# Patient Record
Sex: Female | Born: 1960 | Race: Black or African American | Hispanic: No | Marital: Married | Smoking: Former smoker
Health system: Southern US, Community
[De-identification: ages and names within clinical notes are randomized; demographics above are authoritative.]

## PROBLEM LIST (undated history)

## (undated) DIAGNOSIS — E119 Type 2 diabetes mellitus without complications: Secondary | ICD-10-CM

## (undated) DIAGNOSIS — I10 Essential (primary) hypertension: Secondary | ICD-10-CM

## (undated) DIAGNOSIS — G4730 Sleep apnea, unspecified: Secondary | ICD-10-CM

## (undated) DIAGNOSIS — E785 Hyperlipidemia, unspecified: Secondary | ICD-10-CM

## (undated) DIAGNOSIS — R42 Dizziness and giddiness: Secondary | ICD-10-CM

## (undated) HISTORY — PX: TONSILLECTOMY: SUR1361

## (undated) HISTORY — DX: Hyperlipidemia, unspecified: E78.5

## (undated) HISTORY — PX: ABDOMINAL HYSTERECTOMY: SHX81

## (undated) HISTORY — DX: Type 2 diabetes mellitus without complications: E11.9

## (undated) HISTORY — DX: Dizziness and giddiness: R42

## (undated) HISTORY — DX: Essential (primary) hypertension: I10

## (undated) HISTORY — PX: CARPAL TUNNEL RELEASE: SHX101

---

## 2006-04-14 ENCOUNTER — Ambulatory Visit: Payer: Self-pay | Admitting: General Practice

## 2007-09-04 ENCOUNTER — Emergency Department: Payer: Self-pay | Admitting: Emergency Medicine

## 2011-07-27 ENCOUNTER — Ambulatory Visit: Payer: Self-pay

## 2014-01-17 ENCOUNTER — Ambulatory Visit: Payer: Self-pay | Admitting: Internal Medicine

## 2015-09-01 ENCOUNTER — Encounter (INDEPENDENT_AMBULATORY_CARE_PROVIDER_SITE_OTHER): Payer: Self-pay

## 2015-09-01 ENCOUNTER — Ambulatory Visit: Payer: Self-pay

## 2015-09-04 ENCOUNTER — Encounter: Payer: Self-pay | Admitting: Pharmacist

## 2015-12-04 ENCOUNTER — Encounter: Payer: Self-pay | Admitting: Pharmacist

## 2015-12-18 ENCOUNTER — Encounter: Payer: Self-pay | Admitting: Pharmacist

## 2016-06-24 ENCOUNTER — Encounter: Payer: Self-pay | Admitting: Pharmacist

## 2016-07-14 ENCOUNTER — Other Ambulatory Visit: Payer: Self-pay | Admitting: Internal Medicine

## 2016-07-14 DIAGNOSIS — Z1231 Encounter for screening mammogram for malignant neoplasm of breast: Secondary | ICD-10-CM

## 2016-08-05 ENCOUNTER — Encounter (INDEPENDENT_AMBULATORY_CARE_PROVIDER_SITE_OTHER): Payer: Self-pay

## 2016-08-05 ENCOUNTER — Ambulatory Visit: Payer: Self-pay | Admitting: Pharmacist

## 2016-08-05 ENCOUNTER — Encounter: Payer: Self-pay | Admitting: Pharmacist

## 2016-08-05 VITALS — BP 148/82 | Ht <= 58 in | Wt 209.0 lb

## 2016-08-05 DIAGNOSIS — Z79899 Other long term (current) drug therapy: Secondary | ICD-10-CM

## 2016-08-05 NOTE — Patient Instructions (Signed)
Brown rice, whole wheat/whole grain or spinach pasta as options. Spaghetti squash.  Look into walking around track in [place] (behind [facility]) 3 times per week 15-30 minutes per session. Start with 10 minutes per session.  Call [facility] to make sure you are in the [place]. [phone]. Mammogram.  Review Carb Counting Book for options.

## 2016-08-05 NOTE — Progress Notes (Signed)
[facility] Visit Note  Patient: Tamara Wells MRN: [id number] Date of Birth: 1961-05-14 PCP: Tamara Athens, MD   Tamara Wells 55 y.o. female presents for a 6 month follow-up MTM visit today.  BP (!) 148/82 (BP Location: Right Arm, Patient Position: Sitting, Cuff Size: Large)   Ht 4\' 10"  (1.473 m)   Wt 209 lb (94.8 kg)   BMI 43.68 kg/m   Patient Information   Past Medical History:  Diagnosis Date  . Diabetes mellitus without complication ([place])    Dx 2006  . Hyperlipidemia   . Hypertension   . Vertigo    2002      Past Surgical History:  Procedure Laterality Date  . ABDOMINAL HYSTERECTOMY     2000  . TONSILLECTOMY       Family History  Problem Relation Age of Onset  . Diabetes Mother   . Diabetes Father   . Diabetes Maternal Grandmother     New Diagnoses (since last visit): N/A  Family Support: Good  Lifestyle Diet: Breakfast: Oatmeal, eggs, toast Lunch: Sandwich Dinner:Vegetables, green beans, chicken pie; not a lot of fried foods Drinks: Diet Coke, sweet tea, water, coffee (cream, no sugar)    Current Exercise Habits: The patient does not participate in regular exercise at present     History  Alcohol Use No      History  Smoking Status  . Former Smoker  . Types: Cigarettes  . Quit date: 09/27/2004  Smokeless Tobacco  . Never Used      Health Maintenance  Topic Date Due  . Hepatitis C Screening  1961/03/19  . HIV Screening  08/05/1977  . TETANUS/TDAP  08/05/1981  . PAP SMEAR  08/06/1983  . MAMMOGRAM  08/05/2012  . COLONOSCOPY  08/05/2012  . INFLUENZA VACCINE  04/27/2016   Prior to Admission medications   Medication Sig Start Date End Date Taking? Authorizing Provider  atorvastatin (LIPITOR) 10 MG tablet Take 10 mg by mouth every evening.   Yes Tamara Athens, MD  Black Cohosh 40 MG CAPS Take 1 capsule by mouth 2 (two) times daily.   Yes Historical Provider, MD  glipiZIDE (GLUCOTROL) 10 MG tablet Take 10 mg by  mouth 2 (two) times daily before a meal.   Yes Tamara Athens, MD  insulin glargine (LANTUS) 100 UNIT/ML injection Inject 40 Units into the skin every morning.   Yes Tamara Athens, MD  lisinopril (PRINIVIL,ZESTRIL) 40 MG tablet Take 40 mg by mouth every morning.   Yes Tamara Athens, MD  meclizine (ANTIVERT) 25 MG tablet Take 25 mg by mouth daily. As needed for vertigo   Yes Tamara Athens, MD  metFORMIN (GLUCOPHAGE) 1000 MG tablet Take 1,000 mg by mouth 2 (two) times daily with a meal.   Yes Tamara Athens, MD  saxagliptin HCl (ONGLYZA) 5 MG TABS tablet Take 5 mg by mouth every morning.   Yes Tamara Athens, MD     Assessment and Plan: 1. Diabetes: Tamara Wells is not currently testing her blood glucose levels. States she has difficulty with her current meter. Currently taking Onglyza, glipizide, metformin and Lantus. Lantus dose was recently increased from 30 to 40 units daily. She is due for lab work and will get this done in the next week. No recent data on file. 2. Hyperlipidemia: No recent lab work available. Due for lab work in the next week. Currently taking atorvastatin in the evening. Patient does not eat much fried food anymore. 3. Hypertension: Blood pressure not at goal  of less than 140/90 mmHg. BP today was 148/82 mmHg. Lisinopril dose was recently increased from 20 mg daily to 20 mg twice daily. Patient picked up a new Rx today for 40 mg daily. 3. Hot Flashes: Currently using Black Cohosh for hot flashes. Due to family history of cancer, she is not using estrogen supplementation. Primary care doctor is aware she is using this supplement. 4. Vertigo: Has a prescription for meclizine to use as needed. No recent issues. 5. Lifestyle: Discussed alternated food options such as: spaghetti squash, whole wheat pasta, spinach pasta and brown rice. Gave patient a carb counting book to use for portion control and calorie awareness. Encouraged her to walk on track at the [facility]. At  home exercise options given for weight control: 10 minutes of extra activity at home prescribed. 6. Mammogram: Patient is scheduled for a routine mammogram in November. Gave her the Tamara Wells number to call to make sure she qualifies for assistance. 7. Compliance: Tamara Wells uses two weeks worth of pill boxes to organize her medications. She is compliant with her medications and understands what each medication is for. Return in 6 months for a follow-up MTM with the pharmacist.    Tamara Wells K. Dicky Doe, PharmD [facility] Operations Coordinator [phone]

## 2016-08-18 ENCOUNTER — Ambulatory Visit: Payer: Self-pay

## 2016-08-25 ENCOUNTER — Ambulatory Visit: Payer: Self-pay | Attending: Oncology | Admitting: *Deleted

## 2016-08-25 ENCOUNTER — Ambulatory Visit
Admission: RE | Admit: 2016-08-25 | Discharge: 2016-08-25 | Disposition: A | Discharge status: Home / Self Care | Payer: Self-pay | Source: Ambulatory Visit | Attending: Oncology | Admitting: Oncology

## 2016-08-25 ENCOUNTER — Encounter: Payer: Self-pay | Admitting: *Deleted

## 2016-08-25 VITALS — BP 147/82 | HR 106 | Temp 98.2°F | Ht 59.84 in | Wt 211.1 lb

## 2016-08-25 DIAGNOSIS — Z0000 Encounter for general adult medical examination without abnormal findings: Secondary | ICD-10-CM

## 2016-08-25 NOTE — Patient Instructions (Signed)
Gave patient hand-out, Women Staying Healthy, Active and Well from [facility], with education on breast health, pap smears, heart and colon health. 

## 2016-08-25 NOTE — Progress Notes (Signed)
Subjective:     Patient ID: Tamara Wells, female   DOB: [birth year], 55 y.o.   MRN: [id number]  HPI   Review of Systems     Objective:   Physical Exam  Pulmonary/Chest: Right breast exhibits no inverted nipple, no mass, no nipple discharge, no skin change and no tenderness. Left breast exhibits no inverted nipple, no mass, no nipple discharge, no skin change and no tenderness. Breasts are symmetrical.  Abdominal: There is no hepatomegaly.    Genitourinary: No labial fusion. There is no rash, tenderness, lesion or injury on the right labia. There is no rash, tenderness, lesion or injury on the left labia. Right adnexum displays no mass, no tenderness and no fullness. Left adnexum displays no mass and no fullness. No erythema, tenderness or bleeding in the vagina. No foreign body in the vagina. No signs of injury around the vagina. Vaginal discharge found.  Genitourinary Comments: Hysterectomy for fibroids - White non-odorous discharge noted.       Assessment:     55 year old Black female presents to [facility] for clinical breast exam, pelvic exam and mammogram.  Clinical breast exam unremarkable.  Taught self breast awareness.  Pelvic exam without masses or lesions.  There is no cervix present which is consistent with a hysterectomy.  Patient has been screened for eligibility.  She does not have any insurance, Medicare or Medicaid.  She also meets financial eligibility.  Hand-out given on the Affordable Care Act.    Plan:     Screening mammogram ordered.  Will follow-up per [facility] protocol.

## 2016-08-27 ENCOUNTER — Encounter: Payer: Self-pay | Admitting: *Deleted

## 2016-08-27 NOTE — Progress Notes (Signed)
Letter mailed from the [facility] to inform patient of her normal mammogram results.  Patient is to follow-up with annual screening in one year.  HSIS to [place]. 

## 2016-09-22 ENCOUNTER — Telehealth: Payer: Self-pay | Admitting: Pharmacist

## 2016-09-22 NOTE — Telephone Encounter (Signed)
Faxed Onglyza script to [facility] [employer] for refill.

## 2016-10-15 ENCOUNTER — Telehealth: Payer: Self-pay | Admitting: Pharmacist

## 2016-10-15 NOTE — Telephone Encounter (Signed)
Lantus Vials PAP submitted to manufacturer today. Lantus vials insulin, 30 units, once day/daily.

## 2016-11-29 ENCOUNTER — Telehealth: Payer: Self-pay | Admitting: Pharmacist

## 2016-11-29 NOTE — Telephone Encounter (Signed)
[place] for [place]nglyza 5, allow 7-10 days to receive.

## 2017-02-03 ENCOUNTER — Encounter: Payer: Self-pay | Admitting: Pharmacist

## 2017-02-10 ENCOUNTER — Ambulatory Visit: Payer: Self-pay | Admitting: Pharmacist

## 2017-02-10 ENCOUNTER — Encounter: Payer: Self-pay | Admitting: Pharmacist

## 2017-02-10 VITALS — BP 142/84 | Ht <= 58 in | Wt 204.0 lb

## 2017-02-10 DIAGNOSIS — Z79899 Other long term (current) drug therapy: Secondary | ICD-10-CM

## 2017-02-10 NOTE — Patient Instructions (Signed)
Asked Dr. Lavera Guise about using a SSRI such as Paxil (parotexine) for hot flashes. [facility] can provide this at no charge.

## 2017-02-10 NOTE — Progress Notes (Signed)
[facility]ication Management Clinic Visit Note  Patient: Tamara Wells MRN: [id number] Date of Birth: 08-Oct-1960 PCP: Cletis Athens, MD   [name] 56 y.o. female presents for a 6 month MTM follow-up visit today. Her only concern today was about her intense hot flashes.  BP (!) 142/84 (BP Location: Right Arm, Patient Position: Sitting, Cuff Size: Large)   Ht 4\' 9"  (1.448 m)   Wt 204 lb (92.5 kg)   BMI 44.15 kg/m   Patient Information   Past Medical History:  Diagnosis Date  . Diabetes mellitus without complication ([place])    Dx 2006  . Hyperlipidemia   . Hypertension   . Vertigo    2002      Past Surgical History:  Procedure Laterality Date  . ABDOMINAL HYSTERECTOMY     2000  . TONSILLECTOMY       Family History  Problem Relation Age of Onset  . Diabetes Mother   . Diabetes Father   . Diabetes Maternal Grandmother     New Diagnoses (since last visit):   Family Support: Good  Lifestyle Diet: Breakfast: oatmeal, cantaloupe, boiled eggs Lunch: ham and cheese, chips   Dinner: varies, squash, potatoes, hot dogs, baked chicken, broccoli, spinach, green beens Drinks: 3 cups of coffee with caffeine   Current Exercise Habits: The patient does not participate in regular exercise at present  Exercise limited by: None identified   History  Alcohol Use No      History  Smoking Status  . Former Smoker  . Types: Cigarettes  . Quit date: 09/27/2004  Smokeless Tobacco  . Never Used      Health Maintenance  Topic Date Due  . Hepatitis C Screening  November 23, 1960  . HIV Screening  08/05/1977  . TETANUS/TDAP  08/05/1981  . PAP SMEAR  08/06/1983  . COLONOSCOPY  08/05/2012  . INFLUENZA VACCINE  04/27/2017  . MAMMOGRAM  08/25/2018    Prior to Admission medications   Medication Sig Start Date End Date Taking? Authorizing Provider  acetaminophen (TYLENOL) 500 MG tablet Take 500 mg by mouth as needed.   Yes [provider]  atorvastatin (LIPITOR) 10 MG  tablet Take 10 mg by mouth every evening.   Yes Masoud, Viann Shove, MD  Black Cohosh 40 MG CAPS Take 1 capsule by mouth 2 (two) times daily.   Yes [provider]  glipiZIDE (GLUCOTROL) 10 MG tablet Take 10 mg by mouth 2 (two) times daily before a meal.   Yes Masoud, Viann Shove, MD  insulin glargine (LANTUS) 100 UNIT/ML injection Inject 40 Units into the skin every morning.   Yes Cletis Athens, MD  lisinopril (PRINIVIL,ZESTRIL) 40 MG tablet Take 40 mg by mouth every morning.   Yes Cletis Athens, MD  meclizine (ANTIVERT) 25 MG tablet Take 25 mg by mouth daily. As needed for vertigo   Yes Cletis Athens, MD  metFORMIN (GLUCOPHAGE) 1000 MG tablet Take 1,000 mg by mouth 2 (two) times daily with a meal.   Yes Masoud, Viann Shove, MD  Multiple Vitamins-Minerals (CENTRAL-VITE PO) Take 1 tablet by mouth daily.   Yes [provider]  saxagliptin HCl (ONGLYZA) 5 MG TABS tablet Take 5 mg by mouth every morning.   Yes Cletis Athens, MD   Health Maintenance/Date Completed  Last Visit to PCP: Around 09/2016  Next Visit to PCP: July 2018 Specialist Visit:  Dental Exam: denture Eye Exam: Due Prostate Exam: n/a Pelvic/PAP Exam: 2018 Mammogram: 2018 DEXA: none Colonoscopy: no Flu Vaccine: 2017 Pneumonia Vaccine: none  Assessment and Plan: Compliance: states she takes her medications as directed. Patient was given a pill box today. Diabetes: blood sugar in the am around 150. Currently using Lantus, Onglyza, metformin and glipizide. Patient was not aware of her A1c, labs not available in EPIC. Typically checks once daily. She is currently not exercising; states she is afraid to walk in her neighborhood due to the dogs. Patient does want to get back to exercising, but has limited options. She is trying to watch what she eats, avoids fried foods when possible. Gave patient a carb counting book to review. Discussed eating fruit in moderation. Hypertension: BP today was 142/84. Patient stated she has not  taken any of her medications.  Vertigo: Last episode was 2 months ago. Using meclizine as needed. Health Prevention: Completed mammogram and pap smear, on file with Dr. Lavera Guise. Received a flu vaccine in the fall. Hot Flashes: Currently using Black Cohosh with minimal relief. States she sweats so much she is "dripping in sweat". Discussed the potential for paroxetine to help with the hot flashes. Per patient, her MD does not want to use hormone replacement therapy.  Follow-up with pharmacist in 6 months. Patient will follow-up with Dr. Lavera Guise in June or July for her 6 month check-up.  Lesean Woolverton K. Dicky Doe, PharmD [facility] Operations Coordinator [phone]

## 2017-02-16 ENCOUNTER — Telehealth: Payer: Self-pay | Admitting: Pharmacist

## 2017-02-16 NOTE — Telephone Encounter (Signed)
02/16/17 Faxed Re Enrollment application to [place] for Onglyza 5mg  Take one tablet by mouth everyday.

## 2017-02-22 ENCOUNTER — Telehealth: Payer: Self-pay | Admitting: Pharmacist

## 2017-02-22 NOTE — Telephone Encounter (Signed)
02/22/17 Faxed [employer] application for PAP enrollment-Lantus Solostar Inject 40 units under the skin every day in the morning.

## 2017-04-18 ENCOUNTER — Telehealth: Payer: Self-pay | Admitting: Pharmacist

## 2017-04-18 NOTE — Telephone Encounter (Signed)
04/18/17 [place] for [place] Onglyza 5mg , allow 7-10 days to receive.[name]

## 2017-06-03 ENCOUNTER — Telehealth: Payer: Self-pay | Admitting: Pharmacist

## 2017-06-03 NOTE — Telephone Encounter (Signed)
06/03/17 [facility] for refill on Onglyza 5mg .[name]

## 2017-06-08 ENCOUNTER — Telehealth: Payer: Self-pay | Admitting: Pharmacist

## 2017-06-08 NOTE — Telephone Encounter (Signed)
06/08/17 Faxed refill request to [facility] for Lantus Solostar pen Inject 40 units under the skin daily, # 3 boxes.Tamara Wells

## 2017-08-03 ENCOUNTER — Telehealth: Payer: Self-pay | Admitting: Pharmacist

## 2017-08-03 NOTE — Telephone Encounter (Signed)
08/03/17 Called [facility] for refill on Onglyza 5mg , allow 7-10 business days to receive.[name]

## 2017-08-24 ENCOUNTER — Telehealth: Payer: Self-pay | Admitting: Pharmacist

## 2017-08-24 NOTE — Telephone Encounter (Signed)
08/24/17 Faxed [employer] refill request for Lantus Solostar Inject 40 units under the skin every morning # 3.[name]

## 2017-08-25 ENCOUNTER — Encounter: Payer: Self-pay | Admitting: Pharmacist

## 2017-09-29 ENCOUNTER — Encounter: Payer: Self-pay | Admitting: Pharmacist

## 2017-09-29 ENCOUNTER — Other Ambulatory Visit: Payer: Self-pay

## 2017-09-29 ENCOUNTER — Ambulatory Visit: Payer: Self-pay | Admitting: Pharmacist

## 2017-09-29 ENCOUNTER — Telehealth: Payer: Self-pay | Admitting: Pharmacist

## 2017-09-29 VITALS — BP 122/80 | Ht <= 58 in | Wt 204.0 lb

## 2017-09-29 DIAGNOSIS — Z79899 Other long term (current) drug therapy: Secondary | ICD-10-CM

## 2017-09-29 NOTE — Progress Notes (Signed)
[facility] Management Clinic Visit Note  Patient: Tamara Wells MRN: [id number] Date of Birth: 10-23-1960 PCP: Cletis Athens, MD   [name] 57 y.o. female presents for a 67mos f/u MTM visit today.  BP 122/80   Ht 4\' 9"  (1.448 m)   Wt 204 lb (92.5 kg)   BMI 44.15 kg/m   Patient Information   Past Medical History:  Diagnosis Date  . Diabetes mellitus without complication ([place])    Dx 2006  . Hyperlipidemia   . Hypertension   . Vertigo    2002      Past Surgical History:  Procedure Laterality Date  . ABDOMINAL HYSTERECTOMY     2000  . TONSILLECTOMY       Family History  Problem Relation Age of Onset  . Diabetes Mother   . Diabetes Father   . Diabetes Maternal Grandmother     New Diagnoses (since last visit): n/a  Family Support: good  Lifestyle Diet: States she ate well for the holidays but is ready to get back on track Drinks: only water, cut out sodas and tea  Social History   Substance and Sexual Activity  Alcohol Use No      Social History   Tobacco Use  Smoking Status Former Smoker  . Types: Cigarettes  . Last attempt to quit: 09/27/2004  . Years since quitting: 13.0  Smokeless Tobacco Never Used      Health Maintenance  Topic Date Due  . Hepatitis C Screening  26-Jun-1961  . HIV Screening  08/05/1977  . TETANUS/TDAP  08/05/1981  . PAP SMEAR  08/06/1983  . COLONOSCOPY  08/05/2012  . INFLUENZA VACCINE  04/27/2017  . MAMMOGRAM  08/25/2018   Outpatient Encounter Medications as of 09/29/2017  Medication Sig  . acetaminophen (TYLENOL) 500 MG tablet Take 500 mg by mouth as needed.  Marland Kitchen aspirin EC 81 MG tablet Take 81 mg by mouth daily.  Marland Kitchen atorvastatin (LIPITOR) 10 MG tablet Take 10 mg by mouth every evening.  . Black Cohosh 40 MG CAPS Take 1 capsule by mouth 2 (two) times daily.  Marland Kitchen glipiZIDE (GLUCOTROL) 10 MG tablet Take 10 mg by mouth 2 (two) times daily before a meal.  . insulin glargine (LANTUS) 100 UNIT/ML injection Inject 40  Units into the skin every morning.  Marland Kitchen lisinopril (PRINIVIL,ZESTRIL) 40 MG tablet Take 40 mg by mouth every morning.  . meclizine (ANTIVERT) 25 MG tablet Take 25 mg by mouth daily. As needed for vertigo  . metFORMIN (GLUCOPHAGE) 1000 MG tablet Take 1,000 mg by mouth 2 (two) times daily with a meal.  . Multiple Vitamins-Minerals (CENTRAL-VITE PO) Take 1 tablet by mouth daily.  . saxagliptin HCl (ONGLYZA) 5 MG TABS tablet Take 5 mg by mouth every morning.   No facility-administered encounter medications on file as of 09/29/2017.     Assessment and Plan:   Compliance/Adherance: pt knows 3W's of medication with minimal promting. Pt is adherent with picking up refills. Uses a pillbox at home and states she does not miss doses.   HBP: BP today of 122/80 was within goal <140/80. Pt controlled on lisinopril.  Diabetes: sugars have been running 130-140 every morning. Pt checks mostly in the morning but sometimes checks twice daily to make her strips last. Pt has 2 Lantus pens at home. Per [facility] Lantus was ord on 08/24/17 but we have not rcv from mfr. PAP coordinator will call today to speak w/mfr and attempt to get shipment back on track.  Pt states she does not have lows, but knows signs and sx's and knows to use OG or glucose tabs. She is not currently having any SE's or upset stomach from metformin. Pt is also on ACE and STATIN and states that MD added ASA 81mg .  Vertigo: has an episode 2 weeks ago, took meclizine. Claims that it does not happen often and only has episodes about every 3 mos.  Hot Flashes: taking black cohosh. States that this helps but does not completely relieve the hot flashes, suggested generic Prozac to pt, that this may provide some relief.  Netta Neat, PharmD, [facility]) [phone]

## 2017-09-29 NOTE — Telephone Encounter (Signed)
09/29/17 Tamara Wells is seeing patient this morning for MTM, she asked that I check on the refill from 08/24/17 on Lantus Solostar that we have not received. I called [employer] spoke with [place], she reviewed and does not show a record of receiving that application-she did confirm that they did previously have a problem receiving faxes. She stated I need to refax. I did refax with cover sheet explaining previously faxed 08/24/17 and patient is out to please expedite.[name]

## 2017-10-19 ENCOUNTER — Telehealth: Payer: Self-pay | Admitting: Pharmacist

## 2017-10-19 NOTE — Telephone Encounter (Signed)
10/19/17 Printed [place] re enrollment application, mailing patient her part to sign & return, mailing Dr. Lavera Guise her portion to sign & return.Tamara Wells

## 2017-10-28 ENCOUNTER — Other Ambulatory Visit: Payer: Self-pay

## 2017-10-28 DIAGNOSIS — Z1212 Encounter for screening for malignant neoplasm of rectum: Principal | ICD-10-CM

## 2017-10-28 DIAGNOSIS — Z1211 Encounter for screening for malignant neoplasm of colon: Secondary | ICD-10-CM

## 2017-10-28 NOTE — Addendum Note (Signed)
Addended by: Peggye Ley on: 10/28/2017 12:34 PM   Modules accepted: Orders, SmartSet

## 2017-10-31 ENCOUNTER — Telehealth: Payer: Self-pay | Admitting: Pharmacist

## 2017-10-31 NOTE — Telephone Encounter (Signed)
10/31/17 I have received the provider portion back sign, holding for patient to return her portion with income.[name]

## 2017-11-11 ENCOUNTER — Telehealth: Payer: Self-pay | Admitting: Pharmacist

## 2017-11-11 NOTE — Telephone Encounter (Signed)
11/11/17 I have received the Onglyza 5mg  from back signed from patient, she only sent 1 check stub that covers 2 wks. I called patient and explained that I need 1 full month, she will get this information & bring by to our office. I can then re enroll patient with [facility] for this medication.[name]

## 2017-11-15 ENCOUNTER — Ambulatory Visit
Admission: RE | Admit: 2017-11-15 | Payer: PRIVATE HEALTH INSURANCE | Source: Ambulatory Visit | Admitting: Gastroenterology

## 2017-11-15 ENCOUNTER — Encounter: Admission: RE | Payer: Self-pay | Source: Ambulatory Visit

## 2017-11-15 SURGERY — COLONOSCOPY WITH PROPOFOL
Anesthesia: General

## 2017-11-30 ENCOUNTER — Telehealth: Payer: Self-pay | Admitting: Pharmacist

## 2017-11-30 NOTE — Telephone Encounter (Signed)
04/05/71 Faxed application to [place] for Onglyza 5mg  Take one tablet by mouth everyday for PAP renewal.AJ

## 2017-12-06 ENCOUNTER — Telehealth: Payer: Self-pay | Admitting: Pharmacy Technician

## 2017-12-06 NOTE — Telephone Encounter (Signed)
Still need to provide 2018 taxes.  [place] [facility]

## 2017-12-07 ENCOUNTER — Telehealth: Payer: Self-pay | Admitting: Pharmacist

## 2017-12-07 NOTE — Telephone Encounter (Signed)
12/07/2017 1:56:36 PM - Lantus Solostar refill  12/07/17 Mailing [employer] refill request to Dr. Cletis Athens [address] [place], [place] [address] to sign & return to refill Lantus Solostar Inject 40 units under the skin daily in the morning #3.[name]

## 2017-12-14 ENCOUNTER — Telehealth: Payer: Self-pay | Admitting: Pharmacy Technician

## 2017-12-14 NOTE — Telephone Encounter (Signed)
Received updated proof of income.  Patient eligible to receive medication assistance at [facility] through 2019, as long as eligibility requirements continue to be met.  [place] [facility]

## 2017-12-27 ENCOUNTER — Telehealth: Payer: Self-pay | Admitting: Pharmacist

## 2017-12-27 NOTE — Telephone Encounter (Signed)
12/27/2017 3:01:53 PM - Lantus Solostar pen refill  12/27/17 Faxed [employer] refill request for Lantus Solostar pen Inject 40 units under the skin daily in the morning # 3.[name]

## 2018-05-24 DIAGNOSIS — H524 Presbyopia: Secondary | ICD-10-CM | POA: Diagnosis not present

## 2018-05-25 DIAGNOSIS — Z0000 Encounter for general adult medical examination without abnormal findings: Secondary | ICD-10-CM | POA: Diagnosis not present

## 2018-05-25 DIAGNOSIS — E034 Atrophy of thyroid (acquired): Secondary | ICD-10-CM | POA: Diagnosis not present

## 2018-05-25 DIAGNOSIS — R5381 Other malaise: Secondary | ICD-10-CM | POA: Diagnosis not present

## 2018-05-25 DIAGNOSIS — L308 Other specified dermatitis: Secondary | ICD-10-CM | POA: Diagnosis not present

## 2018-05-25 DIAGNOSIS — I10 Essential (primary) hypertension: Secondary | ICD-10-CM | POA: Diagnosis not present

## 2018-05-25 DIAGNOSIS — E119 Type 2 diabetes mellitus without complications: Secondary | ICD-10-CM | POA: Diagnosis not present

## 2018-05-25 DIAGNOSIS — E669 Obesity, unspecified: Secondary | ICD-10-CM | POA: Diagnosis not present

## 2018-07-04 DIAGNOSIS — E113311 Type 2 diabetes mellitus with moderate nonproliferative diabetic retinopathy with macular edema, right eye: Secondary | ICD-10-CM | POA: Diagnosis not present

## 2018-07-04 DIAGNOSIS — H40003 Preglaucoma, unspecified, bilateral: Secondary | ICD-10-CM | POA: Diagnosis not present

## 2018-07-13 DIAGNOSIS — G4733 Obstructive sleep apnea (adult) (pediatric): Secondary | ICD-10-CM | POA: Diagnosis not present

## 2018-09-14 DIAGNOSIS — R0602 Shortness of breath: Secondary | ICD-10-CM | POA: Diagnosis not present

## 2018-09-14 DIAGNOSIS — E669 Obesity, unspecified: Secondary | ICD-10-CM | POA: Diagnosis not present

## 2018-09-14 DIAGNOSIS — I10 Essential (primary) hypertension: Secondary | ICD-10-CM | POA: Diagnosis not present

## 2018-09-14 DIAGNOSIS — E119 Type 2 diabetes mellitus without complications: Secondary | ICD-10-CM | POA: Diagnosis not present

## 2018-09-17 ENCOUNTER — Emergency Department
Admission: EM | Admit: 2018-09-17 | Discharge: 2018-09-17 | Disposition: A | Discharge status: Home / Self Care | Payer: 59 | Attending: Emergency Medicine | Admitting: Emergency Medicine

## 2018-09-17 ENCOUNTER — Other Ambulatory Visit: Payer: Self-pay

## 2018-09-17 ENCOUNTER — Encounter: Payer: Self-pay | Admitting: Emergency Medicine

## 2018-09-17 DIAGNOSIS — Z794 Long term (current) use of insulin: Secondary | ICD-10-CM | POA: Diagnosis not present

## 2018-09-17 DIAGNOSIS — R42 Dizziness and giddiness: Secondary | ICD-10-CM | POA: Diagnosis not present

## 2018-09-17 DIAGNOSIS — I10 Essential (primary) hypertension: Secondary | ICD-10-CM | POA: Diagnosis not present

## 2018-09-17 DIAGNOSIS — Z79899 Other long term (current) drug therapy: Secondary | ICD-10-CM | POA: Diagnosis not present

## 2018-09-17 DIAGNOSIS — E119 Type 2 diabetes mellitus without complications: Secondary | ICD-10-CM | POA: Diagnosis not present

## 2018-09-17 DIAGNOSIS — R000 Tachycardia, unspecified: Secondary | ICD-10-CM | POA: Diagnosis not present

## 2018-09-17 DIAGNOSIS — Z7982 Long term (current) use of aspirin: Secondary | ICD-10-CM | POA: Insufficient documentation

## 2018-09-17 DIAGNOSIS — Z87891 Personal history of nicotine dependence: Secondary | ICD-10-CM | POA: Diagnosis not present

## 2018-09-17 LAB — URINALYSIS, COMPLETE (UACMP) WITH MICROSCOPIC
Bilirubin Urine: NEGATIVE
Glucose, UA: NEGATIVE mg/dL
HGB URINE DIPSTICK: NEGATIVE
Ketones, ur: NEGATIVE mg/dL
Leukocytes, UA: NEGATIVE
NITRITE: NEGATIVE
Protein, ur: NEGATIVE mg/dL
Specific Gravity, Urine: 1.004 — ABNORMAL LOW (ref 1.005–1.030)
Squamous Epithelial / HPF: NONE SEEN (ref 0–5)
pH: 7 (ref 5.0–8.0)

## 2018-09-17 LAB — COMPREHENSIVE METABOLIC PANEL
ALT: 25 U/L (ref 0–44)
AST: 20 U/L (ref 15–41)
Albumin: 4.3 g/dL (ref 3.5–5.0)
Alkaline Phosphatase: 79 U/L (ref 38–126)
Anion gap: 10 (ref 5–15)
BILIRUBIN TOTAL: 0.3 mg/dL (ref 0.3–1.2)
BUN: 16 mg/dL (ref 6–20)
CO2: 23 mmol/L (ref 22–32)
Calcium: 9.5 mg/dL (ref 8.9–10.3)
Chloride: 104 mmol/L (ref 98–111)
Creatinine, Ser: 0.57 mg/dL (ref 0.44–1.00)
GFR calc Af Amer: >60 mL/min (ref >60)
GFR calc non Af Amer: >60 mL/min (ref >60)
Glucose, Bld: 122 mg/dL — ABNORMAL HIGH (ref 70–99)
Potassium: 4 mmol/L (ref 3.5–5.1)
Sodium: 137 mmol/L (ref 135–145)
Total Protein: 7.4 g/dL (ref 6.5–8.1)

## 2018-09-17 LAB — CBC
HCT: 37.8 % (ref 36.0–46.0)
Hemoglobin: 12.3 g/dL (ref 12.0–15.0)
MCH: 28.3 pg (ref 26.0–34.0)
MCHC: 32.5 g/dL (ref 30.0–36.0)
MCV: 87.1 fL (ref 80.0–100.0)
Platelets: 353 10*3/uL (ref 150–400)
RBC: 4.34 MIL/uL (ref 3.87–5.11)
RDW: 14.2 % (ref 11.5–15.5)
WBC: 9.1 10*3/uL (ref 4.0–10.5)
nRBC: 0 % (ref 0.0–0.2)

## 2018-09-17 LAB — TROPONIN I

## 2018-09-17 NOTE — ED Provider Notes (Signed)
[facility] Emergency Department Provider Note ____________________________________________   First MD Initiated Contact with Patient 09/17/18 2014     (approximate)  I have reviewed the triage vital signs and the nursing notes.   HISTORY  Chief Complaint Hypertension and Fatigue    HPI Tamara Wells is a 57 y.o. female with PMH as noted below including hypertension who presents with elevated blood pressures as high as 202 on the top number and 140 at one point on the bottom number over the last several days.  The patient went to see her doctor 3 days ago and was noted to have elevated blood pressure.  She was started on amlodipine 5 mg and has been taking it, but noted that the readings have continued to be elevated.  She reports occasional lightheadedness but no severe headache, chest pain, difficulty breathing, or other acute symptoms.  Past Medical History:  Diagnosis Date  . Diabetes mellitus without complication ([place])    Dx 2006  . Hyperlipidemia   . Hypertension   . Vertigo    2002    There are no active problems to display for this patient.   Past Surgical History:  Procedure Laterality Date  . ABDOMINAL HYSTERECTOMY     2000  . TONSILLECTOMY      Prior to Admission medications   Medication Sig Start Date End Date Taking? Authorizing Provider  acetaminophen (TYLENOL) 500 MG tablet Take 500 mg by mouth as needed.    [provider]  aspirin EC 81 MG tablet Take 81 mg by mouth daily.    [provider]  atorvastatin (LIPITOR) 10 MG tablet Take 10 mg by mouth every evening.    Cletis Athens, MD  Black Cohosh 40 MG CAPS Take 1 capsule by mouth 2 (two) times daily.    [provider]  glipiZIDE (GLUCOTROL) 10 MG tablet Take 10 mg by mouth 2 (two) times daily before a meal.    Masoud, Viann Shove, MD  insulin glargine (LANTUS) 100 UNIT/ML injection Inject 40 Units into the skin every morning.    Cletis Athens, MD    latanoprost (XALATAN) 0.005 % ophthalmic solution 1 DROP IN [facility] EYE AT BEDTIME 09/13/17   [provider]  lisinopril (PRINIVIL,ZESTRIL) 40 MG tablet Take 40 mg by mouth every morning.    Cletis Athens, MD  meclizine (ANTIVERT) 25 MG tablet Take 25 mg by mouth daily. As needed for vertigo    Cletis Athens, MD  metFORMIN (GLUCOPHAGE) 1000 MG tablet Take 1,000 mg by mouth 2 (two) times daily with a meal.    Cletis Athens, MD  Multiple Vitamins-Minerals (CENTRAL-VITE PO) Take 1 tablet by mouth daily.    [provider]  saxagliptin HCl (ONGLYZA) 5 MG TABS tablet Take 5 mg by mouth every morning.    Cletis Athens, MD    Allergies Patient has no known allergies.  Family History  Problem Relation Age of Onset  . Diabetes Mother   . Diabetes Father   . Diabetes Maternal Grandmother     Social History Social History   Tobacco Use  . Smoking status: Former Smoker    Types: Cigarettes    Last attempt to quit: 09/27/2004    Years since quitting: 13.9  . Smokeless tobacco: Never Used  Substance Use Topics  . Alcohol use: No  . Drug use: No    Review of Systems  Constitutional: No fever. Eyes: No redness. ENT: No neck pain. Cardiovascular: Denies chest pain. Respiratory:  Denies shortness of breath. Gastrointestinal: No vomiting.  Genitourinary: Negative for frequency.  Musculoskeletal: Negative for back pain. Skin: Negative for rash. Neurological: Negative for headache.   ____________________________________________   PHYSICAL EXAM:  VITAL SIGNS: ED Triage Vitals  Enc Vitals Group     BP 09/17/18 1727 (!) 155/103     Pulse Rate 09/17/18 1727 (!) 110     Resp 09/17/18 1727 20     Temp 09/17/18 1727 97.9 F (36.6 C)     Temp Source 09/17/18 1727 Oral     SpO2 09/17/18 1727 95 %     Weight 09/17/18 1729 204 lb (92.5 kg)     Height 09/17/18 1729 4\' 9"  (1.448 m)     Head Circumference --      Peak Flow --      Pain Score 09/17/18 1729 8     Pain Loc  --      Pain Edu? --      Excl. in [place]? --     Constitutional: Alert and oriented. Well appearing and in no acute distress. Eyes: Conjunctivae are normal.  Head: Atraumatic. Nose: No congestion/rhinnorhea. Mouth/Throat: Mucous membranes are moist.   Neck: Normal range of motion.  Cardiovascular: Normal rate, regular rhythm. Grossly normal heart sounds.  Good peripheral circulation. Respiratory: Normal respiratory effort.  No retractions. Lungs CTAB. Gastrointestinal: No distention.  Musculoskeletal: Extremities warm and well perfused.  Neurologic:  Normal speech and language. No gross focal neurologic deficits are appreciated.  Skin:  Skin is warm and dry. No rash noted. Psychiatric: Mood and affect are normal. Speech and behavior are normal.  ____________________________________________   LABS (all labs ordered are listed, but only abnormal results are displayed)  Labs Reviewed  URINALYSIS, COMPLETE (UACMP) WITH MICROSCOPIC - Abnormal; Notable for the following components:      Result Value   Color, Urine STRAW (*)    APPearance CLEAR (*)    Specific Gravity, Urine 1.004 (*)    Bacteria, UA RARE (*)    All other components within normal limits  COMPREHENSIVE METABOLIC PANEL - Abnormal; Notable for the following components:   Glucose, Bld 122 (*)    All other components within normal limits  CBC  TROPONIN I   ____________________________________________  EKG  ED ECG REPORT I, Arta Silence, the attending physician, personally viewed and interpreted this ECG.  Date: 09/17/2018 EKG Time: 1742 Rate: 108 Rhythm: Sinus tachycardia QRS Axis: normal Intervals: normal ST/T Wave abnormalities: normal Narrative Interpretation: no evidence of acute ischemia  ____________________________________________  RADIOLOGY   ____________________________________________   PROCEDURES  Procedure(s) performed: No  Procedures  Critical Care performed:  No ____________________________________________   INITIAL IMPRESSION / ASSESSMENT AND PLAN / ED COURSE  Pertinent labs & imaging results that were available during my care of the patient were reviewed by me and considered in my medical decision making (see chart for details).  57 year old female with PMH as noted above presents with persistently elevated blood pressures at home usually running in the 170s over about 100 over the last several days despite being started on amlodipine by her PMD.  She reports some occasional dizziness but denies chest pain, difficulty breathing, or severe headache.  On exam the patient is well-appearing and her vital signs are normal except for borderline tachycardia.  Her blood pressure was initially mildly elevated but is now normal.  The remainder of the exam is unremarkable.  Her EKG shows no significant abnormalities.  The patient has no symptoms of endorgan dysfunction  or hypertensive emergency.  We will obtain labs to rule out cardiac ischemia, renal insufficiency, or other acute abnormalities although my clinical suspicion is low.  Given the consistently elevated blood pressures I will instruct the patient to take 10 mg of amlodipine for now and follow-up with her doctor.  ----------------------------------------- 9:13 PM on 09/17/2018 -----------------------------------------  The lab work-up is unremarkable.  The patient continues to be asymptomatic with normal blood pressure.  She is stable for discharge home at this time.  I counseled her on the results of work-up and the plan of care.  Initially had instructed her to contact her doctor tomorrow although he is on vacation for the next 2 weeks so I think it is reasonable to increase the dose of amlodipine, have her check her blood pressure several times throughout the day, and return if it is persistently elevated.  I instructed her to go back to 5 mg if she gets any low readings or feels lightheaded.   Return precautions given, and she expresses understanding. ____________________________________________   FINAL CLINICAL IMPRESSION(S) / ED DIAGNOSES  Final diagnoses:  Hypertension, unspecified type      NEW MEDICATIONS STARTED DURING THIS VISIT:  New Prescriptions   No medications on file     Note:  This document was prepared using Dragon voice recognition software and may include unintentional dictation errors.    Arta Silence, MD 09/17/18 2114

## 2018-09-17 NOTE — Discharge Instructions (Addendum)
Take 10 mg (2 pills) of the amlodipine daily instead of the 1 pill.  Check your blood pressure several times throughout the day.  You should go back to 1 pill daily if you start to have readings that are low or feel dizzy.  Return to the ER for continued blood pressures especially over 200 on the top number or 120 on the bottom number, as well as for any chest pain, shortness of breath, severe headache, or any other new or worsening symptoms that concern you.  Follow-up with your doctor in 2 weeks.

## 2018-09-17 NOTE — ED Triage Notes (Signed)
States noting pressure elevated x 3 days. States feeling malaise x 1 week.

## 2018-09-18 LAB — GLUCOSE, CAPILLARY: Glucose-Capillary: 108 mg/dL — ABNORMAL HIGH (ref 70–99)

## 2018-09-23 DIAGNOSIS — G4733 Obstructive sleep apnea (adult) (pediatric): Secondary | ICD-10-CM | POA: Diagnosis not present

## 2018-10-16 DIAGNOSIS — E119 Type 2 diabetes mellitus without complications: Secondary | ICD-10-CM | POA: Diagnosis not present

## 2018-10-16 DIAGNOSIS — I10 Essential (primary) hypertension: Secondary | ICD-10-CM | POA: Diagnosis not present

## 2018-10-16 DIAGNOSIS — E669 Obesity, unspecified: Secondary | ICD-10-CM | POA: Diagnosis not present

## 2018-10-16 DIAGNOSIS — R0602 Shortness of breath: Secondary | ICD-10-CM | POA: Diagnosis not present

## 2018-11-10 DIAGNOSIS — D649 Anemia, unspecified: Secondary | ICD-10-CM | POA: Diagnosis not present

## 2018-12-29 MED FILL — metFORMIN HCL 1000 MG TABS: 1000 | 90 days supply | Qty: 180 | Fill #0

## 2018-12-29 MED FILL — LISINOPRIL 20 MG TABLET: 20 | 90 days supply | Qty: 180 | Fill #0

## 2019-01-11 MED FILL — LANTUS SOLOSTAR 100 UNITS/M: 100 | 37 days supply | Qty: 15 | Fill #0 | Status: TO

## 2019-01-19 DIAGNOSIS — E119 Type 2 diabetes mellitus without complications: Secondary | ICD-10-CM | POA: Diagnosis not present

## 2019-01-19 DIAGNOSIS — D649 Anemia, unspecified: Secondary | ICD-10-CM | POA: Diagnosis not present

## 2019-01-19 DIAGNOSIS — E669 Obesity, unspecified: Secondary | ICD-10-CM | POA: Diagnosis not present

## 2019-01-19 DIAGNOSIS — R0602 Shortness of breath: Secondary | ICD-10-CM | POA: Diagnosis not present

## 2019-04-20 DIAGNOSIS — G5601 Carpal tunnel syndrome, right upper limb: Secondary | ICD-10-CM | POA: Diagnosis not present

## 2019-05-10 DIAGNOSIS — G5601 Carpal tunnel syndrome, right upper limb: Secondary | ICD-10-CM | POA: Diagnosis not present

## 2019-05-11 DIAGNOSIS — G5601 Carpal tunnel syndrome, right upper limb: Secondary | ICD-10-CM | POA: Diagnosis not present

## 2019-05-23 ENCOUNTER — Other Ambulatory Visit: Payer: Self-pay | Admitting: Specialist

## 2019-05-24 ENCOUNTER — Other Ambulatory Visit: Payer: Self-pay

## 2019-05-24 ENCOUNTER — Other Ambulatory Visit
Admission: RE | Admit: 2019-05-24 | Discharge: 2019-05-24 | Disposition: A | Discharge status: Home / Self Care | Payer: 59 | Source: Ambulatory Visit | Attending: Specialist | Admitting: Specialist

## 2019-05-24 DIAGNOSIS — E119 Type 2 diabetes mellitus without complications: Secondary | ICD-10-CM | POA: Diagnosis not present

## 2019-05-24 DIAGNOSIS — G5601 Carpal tunnel syndrome, right upper limb: Secondary | ICD-10-CM | POA: Diagnosis not present

## 2019-05-24 DIAGNOSIS — E669 Obesity, unspecified: Secondary | ICD-10-CM | POA: Diagnosis not present

## 2019-05-24 DIAGNOSIS — Z20828 Contact with and (suspected) exposure to other viral communicable diseases: Secondary | ICD-10-CM | POA: Diagnosis not present

## 2019-05-24 DIAGNOSIS — Z01812 Encounter for preprocedural laboratory examination: Secondary | ICD-10-CM | POA: Insufficient documentation

## 2019-05-24 DIAGNOSIS — D649 Anemia, unspecified: Secondary | ICD-10-CM | POA: Diagnosis not present

## 2019-05-24 LAB — SARS CORONAVIRUS 2 (TAT 6-24 HRS): SARS Coronavirus 2: NEGATIVE

## 2019-05-25 ENCOUNTER — Encounter
Admission: RE | Admit: 2019-05-25 | Discharge: 2019-05-25 | Disposition: A | Discharge status: Home / Self Care | Payer: 59 | Source: Ambulatory Visit | Attending: Specialist | Admitting: Specialist

## 2019-05-25 HISTORY — DX: Sleep apnea, unspecified: G47.30

## 2019-05-25 NOTE — Patient Instructions (Addendum)
Your procedure is scheduled on: Mon 05/28/19 Report to [facility] ON [address]ND [address]LO[address] [facility] [address]ALL ENTRANCE. To find out your arrival time please call [phone] between 1PM - 3PM on Today 05/25/19 .  Remember: Instructions that are not followed completely may result in serious medical risk, up to and including death, or upon the discretion of your surgeon and anesthesiologist your surgery may need to be rescheduled.     _X__ 1. Do not eat food after midnight the night before your procedure.                 No gum chewing or hard candies. You may drink clear liquids up to 2 hours                 before you are scheduled to arrive for your surgery- DO not drink clear                 liquids within 2 hours of the start of your surgery.                 Clear Liquids include:  water, apple juice without pulp, clear carbohydrate                 drink such as Clearfast or Gatorade, Black Coffee or Tea (Do not add                 anything to coffee or tea). Diabetics water only  __X__2.  On the morning of surgery brush your teeth with toothpaste and water, you                 may rinse your mouth with mouthwash if you wish.  Do not swallow any              toothpaste of mouthwash.     _X__ 3.  No Alcohol for 24 hours before or after surgery.   _X__ 4.  Do Not Smoke or use e-cigarettes For 24 Hours Prior to Your Surgery.                 Do not use any chewable tobacco products for at least 6 hours prior to                 surgery.  ____  5.  Bring all medications with you on the day of surgery if instructed.   __X__  6.  Notify your doctor if there is any change in your medical condition      (cold, fever, infections).     Do not wear jewelry, make-up, hairpins, clips or nail polish. Do not wear lotions, powders, or perfumes.  Do not shave 48 hours prior to surgery. Men may shave face and neck. Do not bring valuables to the hospital.    [facility] is not responsible for any  belongings or valuables.  Contacts, dentures/partials or body piercings may not be worn into surgery. Bring a case for your contacts, glasses or hearing aids, a denture cup will be supplied. Leave your suitcase in the car. After surgery it may be brought to your room. For patients admitted to the hospital, discharge time is determined by your treatment team.   Patients discharged the day of surgery will not be allowed to drive home.   Please read over the following fact sheets that you were given:   MRSA Information  __X__ Take these medicines the morning of surgery with A SIP OF  WATER:    1. amLODipine (NORVASC  2.   3.   4.  5.  6.  ____ Fleet Enema (as directed)   __X__ Use CHG Soap/SAGE wipes as directed  ____ Use inhalers on the day of surgery  __X__ Stop metformin/Janumet/Farxiga 2 days prior to surgery    __X__ Take 1/2 of usual insulin dose the night before surgery. No insulin the morning          of surgery.   ____ Stop Blood Thinners Coumadin/Plavix/Xarelto/Pleta/Pradaxa/Eliquis/Effient/Aspirin  on   Or contact your Surgeon, Cardiologist or Medical Doctor regarding  ability to stop your blood thinners  __X__ Stop Anti-inflammatories 7 days before surgery such as Advil, Ibuprofen, Motrin,  BC or Goodies Powder, Naprosyn, Naproxen, Aleve, Aspirin    __X__ Stop all herbal supplements, fish oil or vitamin E until after surgery.    ____ Bring C-Pap to the hospital.     Telephone interview. Instructions given. Patient verbalized understanding

## 2019-05-28 ENCOUNTER — Ambulatory Visit
Admission: RE | Admit: 2019-05-28 | Discharge: 2019-05-28 | Disposition: A | Discharge status: Home / Self Care | Payer: 59 | Attending: Specialist | Admitting: Specialist

## 2019-05-28 ENCOUNTER — Ambulatory Visit: Payer: 59 | Admitting: Anesthesiology

## 2019-05-28 ENCOUNTER — Other Ambulatory Visit: Payer: Self-pay

## 2019-05-28 ENCOUNTER — Encounter: Payer: Self-pay | Admitting: *Deleted

## 2019-05-28 ENCOUNTER — Encounter
Admission: RE | Disposition: A | Discharge status: Home / Self Care | Payer: Self-pay | Source: Home / Self Care | Attending: Specialist

## 2019-05-28 DIAGNOSIS — E119 Type 2 diabetes mellitus without complications: Secondary | ICD-10-CM | POA: Insufficient documentation

## 2019-05-28 DIAGNOSIS — Z7984 Long term (current) use of oral hypoglycemic drugs: Secondary | ICD-10-CM | POA: Diagnosis not present

## 2019-05-28 DIAGNOSIS — Z79899 Other long term (current) drug therapy: Secondary | ICD-10-CM | POA: Insufficient documentation

## 2019-05-28 DIAGNOSIS — Z87891 Personal history of nicotine dependence: Secondary | ICD-10-CM | POA: Diagnosis not present

## 2019-05-28 DIAGNOSIS — E785 Hyperlipidemia, unspecified: Secondary | ICD-10-CM | POA: Diagnosis not present

## 2019-05-28 DIAGNOSIS — G5601 Carpal tunnel syndrome, right upper limb: Secondary | ICD-10-CM | POA: Diagnosis not present

## 2019-05-28 DIAGNOSIS — Z7982 Long term (current) use of aspirin: Secondary | ICD-10-CM | POA: Diagnosis not present

## 2019-05-28 DIAGNOSIS — G4730 Sleep apnea, unspecified: Secondary | ICD-10-CM | POA: Insufficient documentation

## 2019-05-28 DIAGNOSIS — I10 Essential (primary) hypertension: Secondary | ICD-10-CM | POA: Diagnosis not present

## 2019-05-28 HISTORY — PX: CARPAL TUNNEL RELEASE: SHX101

## 2019-05-28 LAB — GLUCOSE, CAPILLARY
Glucose-Capillary: 80 mg/dL (ref 70–99)
Glucose-Capillary: 92 mg/dL (ref 70–99)

## 2019-05-28 SURGERY — CARPAL TUNNEL RELEASE
Anesthesia: General | Site: Wrist | Laterality: Right

## 2019-05-28 MED ORDER — SODIUM CHLORIDE 0.9 % IV SOLN
INTRAVENOUS | Status: DC
  Administered 2019-05-28: 07:00:00 via INTRAVENOUS

## 2019-05-28 MED ORDER — GABAPENTIN 300 MG PO CAPS
ORAL_CAPSULE | ORAL | Status: AC
  Administered 2019-05-28: 07:00:00
  Filled 2019-05-28: qty 1

## 2019-05-28 MED ORDER — SODIUM CHLORIDE (PF) 0.9 % IJ SOLN
INTRAMUSCULAR | Status: AC
  Filled 2019-05-28: qty 10

## 2019-05-28 MED ORDER — FENTANYL CITRATE (PF) 100 MCG/2ML IJ SOLN
INTRAMUSCULAR | Status: DC | PRN
  Administered 2019-05-28 (×4): 25 ug via INTRAVENOUS

## 2019-05-28 MED ORDER — HYDROCODONE-ACETAMINOPHEN 5-325 MG PO TABS: 1.0000 | ORAL_TABLET | Freq: Four times a day (QID) | ORAL | 0 refills | Status: DC | PRN

## 2019-05-28 MED ORDER — FENTANYL CITRATE (PF) 100 MCG/2ML IJ SOLN
INTRAMUSCULAR | Status: AC
  Filled 2019-05-28: qty 2

## 2019-05-28 MED ORDER — MIDAZOLAM HCL 2 MG/2ML IJ SOLN
INTRAMUSCULAR | Status: AC
  Filled 2019-05-28: qty 2

## 2019-05-28 MED ORDER — CEFAZOLIN SODIUM-DEXTROSE 2-4 GM/100ML-% IV SOLN
INTRAVENOUS | Status: AC
  Filled 2019-05-28: qty 100

## 2019-05-28 MED ORDER — CHLORHEXIDINE GLUCONATE CLOTH 2 % EX PADS: 6.0000 | MEDICATED_PAD | Freq: Once | CUTANEOUS | Status: DC

## 2019-05-28 MED ORDER — CLINDAMYCIN PHOSPHATE 600 MG/50ML IV SOLN
600.0000 mg | INTRAVENOUS | Status: AC
  Administered 2019-05-28: 600 mg via INTRAVENOUS

## 2019-05-28 MED ORDER — BUPIVACAINE HCL (PF) 0.5 % IJ SOLN
INTRAMUSCULAR | Status: AC
  Filled 2019-05-28: qty 30

## 2019-05-28 MED ORDER — ONDANSETRON HCL 4 MG/2ML IJ SOLN
INTRAMUSCULAR | Status: AC
  Filled 2019-05-28: qty 2

## 2019-05-28 MED ORDER — MIDAZOLAM HCL 2 MG/2ML IJ SOLN
INTRAMUSCULAR | Status: DC | PRN
  Administered 2019-05-28: 2 mg via INTRAVENOUS

## 2019-05-28 MED ORDER — BUPIVACAINE HCL 0.5 % IJ SOLN
INTRAMUSCULAR | Status: DC | PRN
  Administered 2019-05-28: 17 mL

## 2019-05-28 MED ORDER — FAMOTIDINE 20 MG PO TABS
20.0000 mg | ORAL_TABLET | Freq: Once | ORAL | Status: AC
  Administered 2019-05-28: 20 mg via ORAL

## 2019-05-28 MED ORDER — PROMETHAZINE HCL 25 MG/ML IJ SOLN
INTRAMUSCULAR | Status: AC
  Administered 2019-05-28: 11:00:00 6.25 mg via INTRAVENOUS
  Filled 2019-05-28: qty 1

## 2019-05-28 MED ORDER — FENTANYL CITRATE (PF) 100 MCG/2ML IJ SOLN: 25.0000 ug | INTRAMUSCULAR | Status: DC | PRN

## 2019-05-28 MED ORDER — MELOXICAM 15 MG PO TABS: 15.0000 mg | ORAL_TABLET | Freq: Every day | ORAL | 3 refills | Status: DC

## 2019-05-28 MED ORDER — GABAPENTIN 300 MG PO CAPS: 300.0000 mg | ORAL_CAPSULE | ORAL | Status: DC

## 2019-05-28 MED ORDER — ONDANSETRON HCL 4 MG/2ML IJ SOLN
INTRAMUSCULAR | Status: DC | PRN
  Administered 2019-05-28: 4 mg via INTRAVENOUS

## 2019-05-28 MED ORDER — PROPOFOL 10 MG/ML IV BOLUS
INTRAVENOUS | Status: AC
  Filled 2019-05-28: qty 20

## 2019-05-28 MED ORDER — CLINDAMYCIN PHOSPHATE 600 MG/50ML IV SOLN
INTRAVENOUS | Status: AC
  Filled 2019-05-28: qty 50

## 2019-05-28 MED ORDER — FAMOTIDINE 20 MG PO TABS
ORAL_TABLET | ORAL | Status: AC
  Filled 2019-05-28: qty 1

## 2019-05-28 MED ORDER — GABAPENTIN 400 MG PO CAPS: 400.0000 mg | ORAL_CAPSULE | Freq: Three times a day (TID) | ORAL | 3 refills | Status: DC

## 2019-05-28 MED ORDER — PROPOFOL 10 MG/ML IV BOLUS
INTRAVENOUS | Status: DC | PRN
  Administered 2019-05-28: 130 mg via INTRAVENOUS

## 2019-05-28 MED ORDER — PROMETHAZINE HCL 25 MG/ML IJ SOLN
6.2500 mg | INTRAMUSCULAR | Status: DC | PRN
  Administered 2019-05-28: 11:00:00 6.25 mg via INTRAVENOUS

## 2019-05-28 MED ORDER — CLINDAMYCIN PHOSPHATE 600 MG/50ML IV SOLN: 600.0000 mg | INTRAVENOUS | Status: DC

## 2019-05-28 MED ORDER — CEFAZOLIN SODIUM-DEXTROSE 2-4 GM/100ML-% IV SOLN
2.0000 g | INTRAVENOUS | Status: AC
  Administered 2019-05-28: 2 g via INTRAVENOUS

## 2019-05-28 SURGICAL SUPPLY — 28 items
BLADE SURG MINI STRL (BLADE) ×2 IMPLANT
BNDG ESMARK 4X12 TAN STRL LF (GAUZE/BANDAGES/DRESSINGS) ×2 IMPLANT
CANISTER SUCT 1200ML W/VALVE (MISCELLANEOUS) ×2 IMPLANT
CHLORAPREP W/TINT 26 (MISCELLANEOUS) ×2 IMPLANT
COVER WAND RF STERILE (DRAPES) ×2 IMPLANT
CUFF TOURN SGL QUICK 18X4 (TOURNIQUET CUFF) IMPLANT
DRSG GAUZE FLUFF 36X18 (GAUZE/BANDAGES/DRESSINGS) ×2 IMPLANT
ELECT REM PT RETURN 9FT ADLT (ELECTROSURGICAL) ×2
ELECTRODE REM PT RTRN 9FT ADLT (ELECTROSURGICAL) ×1 IMPLANT
GAUZE XEROFORM 1X8 LF (GAUZE/BANDAGES/DRESSINGS) ×2 IMPLANT
GLOVE BIO SURGEON STRL SZ8 (GLOVE) ×2 IMPLANT
GOWN STRL REUS W/ TWL LRG LVL3 (GOWN DISPOSABLE) ×1 IMPLANT
GOWN STRL REUS W/TWL LRG LVL3 (GOWN DISPOSABLE) ×1
GOWN STRL REUS W/TWL LRG LVL4 (GOWN DISPOSABLE) ×2 IMPLANT
KIT TURNOVER KIT A (KITS) ×2 IMPLANT
NS IRRIG 500ML POUR BTL (IV SOLUTION) ×2 IMPLANT
PACK EXTREMITY ARMC (MISCELLANEOUS) ×2 IMPLANT
PAD PREP 24X41 OB/GYN DISP (PERSONAL CARE ITEMS) ×2 IMPLANT
PADDING CAST 4IN STRL (MISCELLANEOUS) ×1
PADDING CAST BLEND 4X4 STRL (MISCELLANEOUS) ×1 IMPLANT
SPLINT CAST 1 STEP 3X12 (MISCELLANEOUS) ×2 IMPLANT
STOCKINETTE 48X4 2 PLY STRL (GAUZE/BANDAGES/DRESSINGS) ×1 IMPLANT
STOCKINETTE BIAS CUT 4 980044 (GAUZE/BANDAGES/DRESSINGS) ×2 IMPLANT
STOCKINETTE STRL 4IN 9604848 (GAUZE/BANDAGES/DRESSINGS) ×2 IMPLANT
SUT ETHILON 4-0 (SUTURE) ×1
SUT ETHILON 4-0 FS2 18XMFL BLK (SUTURE) ×1
SUT ETHILON 5-0 FS-2 18 BLK (SUTURE) ×2 IMPLANT
SUTURE ETHLN 4-0 FS2 18XMF BLK (SUTURE) ×1 IMPLANT

## 2019-05-28 NOTE — Discharge Instructions (Signed)

## 2019-05-28 NOTE — Anesthesia Procedure Notes (Signed)
Procedure Name: LMA Insertion Date/Time: 05/28/2019 8:44 AM Performed by: Jonna Clark, CRNA Pre-anesthesia Checklist: Patient identified, Patient being monitored, Timeout performed, Emergency Drugs available and Suction available Patient Re-evaluated:Patient Re-evaluated prior to induction Oxygen Delivery Method: Circle system utilized Preoxygenation: Pre-oxygenation with 100% oxygen Induction Type: IV induction Ventilation: Mask ventilation without difficulty LMA: LMA inserted LMA Size: 3.5 Tube type: Oral Number of attempts: 1 Placement Confirmation: positive ETCO2 and breath sounds checked- equal and bilateral Tube secured with: Tape Dental Injury: Teeth and Oropharynx as per pre-operative assessment

## 2019-05-28 NOTE — Op Note (Signed)
05/28/2019  9:20 AM  PATIENT:  [name]    PRE-OPERATIVE DIAGNOSIS: RIGHT CARPAL TUNNEL SYNDROME POST-OPERATIVE DIAGNOSIS: RIGHT CARPAL TUNNEL SYNDROME  PROCEDURE:  RIGHT CARPAL TUNNEL RELEASE  SURGEON: Park Breed, MD    ANESTHESIA:   General  TOURNIQUET TIME: 18   MIN  PREOPERATIVE INDICATIONS:  Tamara Wells is a  58 y.o. female with a diagnosis of right carpal tunnel syndrome who failed conservative measures and elected for surgical management.    The risks benefits and alternatives were discussed with the patient preoperatively including but not limited to the risks of infection, bleeding, nerve injury, incomplete relief of symptoms, pillar pain, cardiopulmonary complications, the need for revision surgery, among others, and the patient was willing to proceed.  OPERATIVE FINDINGS: Thickened volar ligament and nerve compression.  OPERATIVE PROCEDURE: The patient is brought to the operating room placed in the supine position. General anesthesia was administered. The right upper extremity was prepped and draped in usual sterile fashion. Time out was performed. The arm was elevated and exsanguinated and the tourniquet was inflated. Incision was made in line with the radial border of the ring finger. The carpal tunnel transverse fascia was identified, cleaned, and incised sharply. The common sensory branches were visualized along with the superficial palmar arch and protected.  The median nerve was protected below  A Kelly clamp was  placed underneath the transverse carpal ligament, protecting the nerve. I released the ligament completely, and then released the proximal distal volar forearm fascia. The nerve was identified, and visualized, and protected throughout the case. The motor branch was intact upon inspection.  No masses or abnormalities were identified in ulnar bursa.  The wounds were irrigated copiously, and the skin closed with nylon. The wound was injected with 1/2%  marcaine followed by a sterile dressing and  volar splint .  Tourniquet was deflated with good return of blood flow to all fingers. Sponge and needle counts were correct.  The patient tolerated this well, with no complications. The patient was awakened and taken to recovery in good condition.

## 2019-05-28 NOTE — Anesthesia Post-op Follow-up Note (Signed)
Anesthesia QCDR form completed.        

## 2019-05-28 NOTE — H&P (Signed)
THE PATIENT WAS SEEN PRIOR TO SURGERY TODAY.  HISTORY, ALLERGIES, HOME MEDICATIONS AND OPERATIVE PROCEDURE WERE REVIEWED. RISKS AND BENEFITS OF SURGERY DISCUSSED WITH PATIENT AGAIN.  NO CHANGES FROM INITIAL HISTORY AND PHYSICAL NOTED.    

## 2019-05-28 NOTE — Transfer of Care (Signed)
Immediate Anesthesia Transfer of Care Note  Patient: Tamara Wells  Procedure(s) Performed: CARPAL TUNNEL RELEASE (Right Wrist)  Patient Location: PACU  Anesthesia Type:General  Level of Consciousness: drowsy and patient cooperative  Airway & Oxygen Therapy: Patient Spontanous Breathing and Patient connected to face mask oxygen  Post-op Assessment: Report given to RN and Post -op Vital signs reviewed and stable  Post vital signs: Reviewed and stable  Last Vitals:  Vitals Value Taken Time  BP 129/81 05/28/19 0929  Temp 36.1 C 05/28/19 0929  Pulse 85 05/28/19 0929  Resp 12 05/28/19 0929  SpO2 100 % 05/28/19 0929  Vitals shown include unvalidated device data.  Last Pain:  Vitals:   05/28/19 0618  TempSrc: Oral  PainSc: 8       Patients Stated Pain Goal: 2 ([id number])  Complications: No apparent anesthesia complications

## 2019-05-28 NOTE — Anesthesia Preprocedure Evaluation (Addendum)
Anesthesia Evaluation  Patient identified by MRN, date of birth, ID band Patient awake    Reviewed: Allergy & Precautions, H&P , NPO status , Patient's Chart, lab work & pertinent test results, reviewed documented beta blocker date and time   History of Anesthesia Complications Negative for: history of anesthetic complications  Airway Mallampati: IV  TM Distance: >3 FB Neck ROM: full    Dental  (+) Dental Advidsory Given, Upper Dentures, Poor Dentition   Pulmonary neg shortness of breath, sleep apnea , neg COPD, neg recent URI, former smoker,    Pulmonary exam normal        Cardiovascular Exercise Tolerance: Good hypertension, (-) angina(-) Past MI and (-) Cardiac Stents Normal cardiovascular exam(-) dysrhythmias (-) Valvular Problems/Murmurs     Neuro/Psych negative neurological ROS  negative psych ROS   GI/Hepatic negative GI ROS, Neg liver ROS,   Endo/Other  diabetesMorbid obesity  Renal/GU negative Renal ROS  negative genitourinary   Musculoskeletal   Abdominal   Peds  Hematology negative hematology ROS (+)   Anesthesia Other Findings Past Medical History: No date: Diabetes mellitus without complication ([place])     Comment:  Dx 2006 No date: Hyperlipidemia No date: Hypertension No date: Sleep apnea No date: Vertigo     Comment:  2002   Reproductive/Obstetrics negative OB ROS                             Anesthesia Physical Anesthesia Plan  ASA: III  Anesthesia Plan: General   Post-op Pain Management:    Induction: Intravenous  PONV Risk Score and Plan: 3 and Ondansetron, Dexamethasone, Midazolam and Treatment may vary due to age or medical condition  Airway Management Planned: Oral ETT  Additional Equipment:   Intra-op Plan:   Post-operative Plan: Extubation in OR  Informed Consent: I have reviewed the patients History and Physical, chart, labs and discussed the  procedure including the risks, benefits and alternatives for the proposed anesthesia with the patient or authorized representative who has indicated his/her understanding and acceptance.     Dental Advisory Given  Plan Discussed with: Anesthesiologist, CRNA and Surgeon  Anesthesia Plan Comments:        Anesthesia Quick Evaluation

## 2019-05-28 NOTE — Anesthesia Postprocedure Evaluation (Signed)
Anesthesia Post Note  Patient: Tamara Wells  Procedure(s) Performed: CARPAL TUNNEL RELEASE (Right Wrist)  Patient location during evaluation: PACU Anesthesia Type: General Level of consciousness: awake and alert Pain management: pain level controlled Vital Signs Assessment: post-procedure vital signs reviewed and stable Respiratory status: spontaneous breathing, nonlabored ventilation, respiratory function stable and patient connected to nasal cannula oxygen Cardiovascular status: blood pressure returned to baseline and stable Postop Assessment: no apparent nausea or vomiting Anesthetic complications: no     Last Vitals:  Vitals:   05/28/19 1012 05/28/19 1046  BP: 109/73 107/67  Pulse: 81 80  Resp: 18 16  Temp: (!) 36.3 C   SpO2: 96% 96%    Last Pain:  Vitals:   05/28/19 1046  TempSrc:   PainSc: 0-No pain                 Martha Clan

## 2019-09-04 DIAGNOSIS — H524 Presbyopia: Secondary | ICD-10-CM | POA: Diagnosis not present

## 2019-09-04 DIAGNOSIS — H5203 Hypermetropia, bilateral: Secondary | ICD-10-CM | POA: Diagnosis not present

## 2019-09-04 DIAGNOSIS — H40053 Ocular hypertension, bilateral: Secondary | ICD-10-CM | POA: Diagnosis not present

## 2019-09-04 DIAGNOSIS — E113393 Type 2 diabetes mellitus with moderate nonproliferative diabetic retinopathy without macular edema, bilateral: Secondary | ICD-10-CM | POA: Diagnosis not present

## 2019-09-04 DIAGNOSIS — H52223 Regular astigmatism, bilateral: Secondary | ICD-10-CM | POA: Diagnosis not present

## 2019-10-24 DIAGNOSIS — R5381 Other malaise: Secondary | ICD-10-CM | POA: Diagnosis not present

## 2019-10-24 DIAGNOSIS — E119 Type 2 diabetes mellitus without complications: Secondary | ICD-10-CM | POA: Diagnosis not present

## 2019-10-24 DIAGNOSIS — I10 Essential (primary) hypertension: Secondary | ICD-10-CM | POA: Diagnosis not present

## 2019-10-24 DIAGNOSIS — H8110 Benign paroxysmal vertigo, unspecified ear: Secondary | ICD-10-CM | POA: Diagnosis not present

## 2019-10-24 DIAGNOSIS — D649 Anemia, unspecified: Secondary | ICD-10-CM | POA: Diagnosis not present

## 2019-10-24 DIAGNOSIS — E034 Atrophy of thyroid (acquired): Secondary | ICD-10-CM | POA: Diagnosis not present

## 2019-10-24 DIAGNOSIS — E669 Obesity, unspecified: Secondary | ICD-10-CM | POA: Diagnosis not present

## 2019-11-02 DIAGNOSIS — M67442 Ganglion, left hand: Secondary | ICD-10-CM | POA: Diagnosis not present

## 2020-02-14 ENCOUNTER — Other Ambulatory Visit: Payer: Self-pay | Admitting: *Deleted

## 2020-02-14 MED ORDER — INSULIN GLARGINE 100 UNIT/ML ~~LOC~~ SOLN: 40.0000 [IU] | SUBCUTANEOUS | 6 refills | Status: DC

## 2020-02-21 ENCOUNTER — Other Ambulatory Visit: Payer: Self-pay

## 2020-02-21 ENCOUNTER — Encounter: Payer: Self-pay | Admitting: Internal Medicine

## 2020-02-21 ENCOUNTER — Ambulatory Visit (INDEPENDENT_AMBULATORY_CARE_PROVIDER_SITE_OTHER): Payer: 59 | Admitting: Internal Medicine

## 2020-02-21 VITALS — BP 144/96 | HR 103 | Wt 195.6 lb

## 2020-02-21 DIAGNOSIS — Z0000 Encounter for general adult medical examination without abnormal findings: Secondary | ICD-10-CM | POA: Diagnosis not present

## 2020-02-21 DIAGNOSIS — I10 Essential (primary) hypertension: Secondary | ICD-10-CM | POA: Diagnosis not present

## 2020-02-21 DIAGNOSIS — Z6841 Body Mass Index (BMI) 40.0 and over, adult: Secondary | ICD-10-CM | POA: Insufficient documentation

## 2020-02-21 DIAGNOSIS — Z6836 Body mass index (BMI) 36.0-36.9, adult: Secondary | ICD-10-CM | POA: Diagnosis not present

## 2020-02-21 DIAGNOSIS — E119 Type 2 diabetes mellitus without complications: Secondary | ICD-10-CM | POA: Diagnosis not present

## 2020-02-21 DIAGNOSIS — E6609 Other obesity due to excess calories: Secondary | ICD-10-CM

## 2020-02-21 DIAGNOSIS — Z794 Long term (current) use of insulin: Secondary | ICD-10-CM | POA: Diagnosis not present

## 2020-02-21 MED ORDER — BASAGLAR KWIKPEN 100 UNIT/ML ~~LOC~~ SOPN: 40.0000 [IU] | PEN_INJECTOR | Freq: Every day | SUBCUTANEOUS | 6 refills | Status: DC

## 2020-02-21 NOTE — Assessment & Plan Note (Signed)
Patient is going to work on that.

## 2020-02-21 NOTE — Assessment & Plan Note (Signed)
She denies any chest pain or shortness of breath her main problem at the present time is exogenous obesity and control of diabetes.  She usually see a eye doctor on an annual basis.  She is due for a colonoscopy and mammogram and we are going to schedule her for that area.  Weight reduction is important for her at the present time.  She does not smoke or drink alcohol.

## 2020-02-21 NOTE — Patient Instructions (Signed)
Colonoscopy, Adult A colonoscopy is a procedure to look at the entire large intestine. This procedure is done using a long, thin, flexible tube that has a camera on the end. You may have a colonoscopy:  As a part of normal colorectal screening.  If you have certain symptoms, such as: ? A low number of red blood cells in your blood (anemia). ? Diarrhea that does not go away. ? Pain in your abdomen. ? Blood in your stool. A colonoscopy can help screen for and diagnose medical problems, including:  Tumors.  Extra tissue that grows where mucus forms (polyps).  Inflammation.  Areas of bleeding. Tell your health care provider about:  Any allergies you have.  All medicines you are taking, including vitamins, herbs, eye drops, creams, and over-the-counter medicines.  Any problems you or family members have had with anesthetic medicines.  Any blood disorders you have.  Any surgeries you have had.  Any medical conditions you have.  Any problems you have had with having bowel movements.  Whether you are pregnant or may be pregnant. What are the risks? Generally, this is a safe procedure. However, problems may occur, including:  Bleeding.  Damage to your intestine.  Allergic reactions to medicines given during the procedure.  Infection. This is rare. What happens before the procedure? Eating and drinking restrictions Follow instructions from your health care provider about eating or drinking restrictions, which may include:  A few days before the procedure: ? Follow a low-fiber diet. ? Avoid nuts, seeds, dried fruit, raw fruits, and vegetables.  1-3 days before the procedure: ? Eat only gelatin dessert or ice pops. ? Drink only clear liquids, such as water, clear juice, clear broth or bouillon, black coffee or tea, or clear soft drinks or sports drinks. ? Avoid liquids that contain red or purple dye.  The day of the procedure: ? Do not eat solid foods. You may  continue to drink clear liquids until up to 2 hours before the procedure. ? Do not eat or drink anything starting 2 hours before the procedure, or within the time period that your health care provider recommends. Bowel prep If you were prescribed a bowel prep to take by mouth (orally) to clean out your colon:  Take it as told by your health care provider. Starting the day before your procedure, you will need to drink a large amount of liquid medicine. The liquid will cause you to have many bowel movements of loose stool until your stool becomes almost clear or light green.  If your skin or the opening between the buttocks (anus) gets irritated from diarrhea, you may relieve the irritation using: ? Wipes with medicine in them, such as adult wet wipes with aloe and vitamin E. ? A product to soothe skin, such as petroleum jelly.  If you vomit while drinking the bowel prep: ? Take a break for up to 60 minutes. ? Begin the bowel prep again. ? Call your health care provider if you keep vomiting or you cannot take the bowel prep without vomiting.  To clean out your colon, you may also be given: ? Laxative medicines. These help you have a bowel movement. ? Instructions for enema use. An enema is liquid medicine injected into your rectum. Medicines Ask your health care provider about:  Changing or stopping your regular medicines or supplements. This is especially important if you are taking iron supplements, diabetes medicines, or blood thinners.  Taking medicines such as aspirin and ibuprofen. These  can thin your blood. Do not take these medicines unless your health care provider tells you to take them.  Taking over-the-counter medicines, vitamins, herbs, and supplements. General instructions  Ask your health care provider what steps will be taken to help prevent infection. These may include washing skin with a germ-killing soap.  Plan to have someone take you home from the hospital  or clinic. What happens during the procedure?   An IV will be inserted into one of your veins.  You may be given one or more of the following: ? A medicine to help you relax (sedative). ? A medicine to numb the area (local anesthetic). ? A medicine to make you fall asleep (general anesthetic). This is rarely needed.  You will lie on your side with your knees bent.  The tube will: ? Have oil or gel put on it (be lubricated). ? Be inserted into your anus. ? Be gently eased through all parts of your large intestine.  Air will be sent into your colon to keep it open. This may cause some pressure or cramping.  Images will be taken with the camera and will appear on a screen.  A small tissue sample may be removed to be looked at under a microscope (biopsy). The tissue may be sent to a lab for testing if any signs of problems are found.  If small polyps are found, they may be removed and checked for cancer cells.  When the procedure is finished, the tube will be removed. The procedure may vary among health care providers and hospitals. What happens after the procedure?  Your blood pressure, heart rate, breathing rate, and blood oxygen level will be monitored until you leave the hospital or clinic.  You may have a small amount of blood in your stool.  You may pass gas and have mild cramping or bloating in your abdomen. This is caused by the air that was used to open your colon during the exam.  Do not drive for 24 hours after the procedure.  It is up to you to get the results of your procedure. Ask your health care provider, or the department that is doing the procedure, when your results will be ready. Summary  A colonoscopy is a procedure to look at the entire large intestine.  Follow instructions from your health care provider about eating and drinking before the procedure.  If you were prescribed an oral bowel prep to clean out your colon, take it as told by your health care  provider.  During the colonoscopy, a flexible tube with a camera on its end is inserted into the anus and then passed into the other parts of the large intestine. This information is not intended to replace advice given to you by your health care provider. Make sure you discuss any questions you have with your health care provider. Document Revised: 04/06/2019 Document Reviewed: 04/06/2019 Elsevier Patient Education  2020 [employer]  

## 2020-02-21 NOTE — Progress Notes (Signed)
Established Patient Office Visit  Subjective:  Patient ID: Tamara Wells, female    DOB: 1960/12/26  Age: 59 y.o. MRN: [id number]  CC:  Chief Complaint  Patient presents with  . Annual Exam    patient also needs change in insulin, insurance will no longer pay for lantus     HPI  [name] presents for blood sugar  Check. At the present time it run 140range.  She has pain in the left wrist but denies any chest pain nausea vomiting or shortness of breath.  Patient walk on a daily basis 1 1 block.  Patient has lost about 10 pounds on walking.and  diet.   Past Medical History:  Diagnosis Date  . Diabetes mellitus without complication ([place])    Dx 2006  . Hyperlipidemia   . Hypertension   . Sleep apnea   . Vertigo    2002    Past Surgical History:  Procedure Laterality Date  . ABDOMINAL HYSTERECTOMY     2000  . CARPAL TUNNEL RELEASE Left   . CARPAL TUNNEL RELEASE Right 05/28/2019   Procedure: CARPAL TUNNEL RELEASE;  Surgeon: Earnestine Leys, MD;  Location: [facility] ORS;  Service: Orthopedics;  Laterality: Right;  . TONSILLECTOMY      Family History  Problem Relation Age of Onset  . Diabetes Mother   . Diabetes Father   . Diabetes Maternal Grandmother     Social History   Socioeconomic History  . Marital status: Married    Spouse name: Not on file  . Number of children: Not on file  . Years of education: Not on file  . Highest education level: Not on file  Occupational History  . Not on file  Tobacco Use  . Smoking status: Former Smoker    Types: Cigarettes    Quit date: 09/27/2004    Years since quitting: 15.4  . Smokeless tobacco: Never Used  Substance and Sexual Activity  . Alcohol use: No  . Drug use: No  . Sexual activity: Not on file  Other Topics Concern  . Not on file  Social History Narrative  . Not on file   Social Determinants of Health   Financial Resource Strain:   . Difficulty of Paying Living Expenses:   Food Insecurity:   .  Worried About Charity fundraiser in the Last Year:   . Arboriculturist in the Last Year:   Transportation Needs:   . Film/video editor (Medical):   Marland Kitchen Lack of Transportation (Non-Medical):   Physical Activity:   . Days of Exercise per Week:   . Minutes of Exercise per Session:   Stress:   . Feeling of Stress :   Social Connections:   . Frequency of Communication with Friends and Family:   . Frequency of Social Gatherings with Friends and Family:   . Attends Religious Services:   . Active Member of Clubs or Organizations:   . Attends Archivist Meetings:   Marland Kitchen Marital Status:   Intimate Partner Violence:   . Fear of Current or Ex-Partner:   . Emotionally Abused:   Marland Kitchen Physically Abused:   . Sexually Abused:      Current Outpatient Medications:  .  acetaminophen (TYLENOL) 500 MG tablet, Take 1,000 mg by mouth every 6 (six) hours as needed for moderate pain or headache. , Disp: , Rfl:  .  amLODipine (NORVASC) 5 MG tablet, Take 5 mg by mouth daily., Disp: , Rfl:  .  aspirin EC 81 MG tablet, Take 81 mg by mouth daily., Disp: , Rfl:  .  Black Cohosh 40 MG CAPS, Take 40 mg by mouth 2 (two) times daily., Disp: , Rfl:  .  diphenhydrAMINE (BENADRYL) 25 MG tablet, Take 25 mg by mouth daily as needed for allergies., Disp: , Rfl:  .  fluticasone (FLONASE) 50 MCG/ACT nasal spray, Place 1 spray into both nostrils at bedtime as needed for allergies or rhinitis., Disp: , Rfl:  .  gabapentin (NEURONTIN) 400 MG capsule, Take 1 capsule (400 mg total) by mouth 3 (three) times daily. (Patient not taking: Reported on 02/21/2020), Disp: 60 capsule, Rfl: 3 .  glipiZIDE (GLUCOTROL) 10 MG tablet, Take 10 mg by mouth 2 (two) times daily before a meal., Disp: , Rfl:  .  HYDROcodone-acetaminophen (NORCO) 5-325 MG tablet, Take 1-2 tablets by mouth every 6 (six) hours as needed. (Patient not taking: Reported on 02/21/2020), Disp: 50 tablet, Rfl: 0 .  insulin glargine (LANTUS) 100 UNIT/ML injection,  Inject 0.4 mLs (40 Units total) into the skin every morning., Disp: 10 mL, Rfl: 6 .  latanoprost (XALATAN) 0.005 % ophthalmic solution, Place 1 drop into both eyes at bedtime. , Disp: , Rfl: 11 .  lisinopril (ZESTRIL) 20 MG tablet, Take 40 mg by mouth daily., Disp: , Rfl:  .  meclizine (ANTIVERT) 25 MG tablet, Take 25 mg by mouth 3 (three) times daily as needed for dizziness. , Disp: , Rfl:  .  meloxicam (MOBIC) 15 MG tablet, Take 15 mg by mouth daily., Disp: , Rfl:  .  meloxicam (MOBIC) 15 MG tablet, Take 1 tablet (15 mg total) by mouth daily., Disp: 30 tablet, Rfl: 3 .  metFORMIN (GLUCOPHAGE) 1000 MG tablet, Take 1,000 mg by mouth 2 (two) times daily with a meal., Disp: , Rfl:  .  Multiple Vitamins-Minerals (CENTRAL-VITE PO), Take 1 tablet by mouth daily., Disp: , Rfl:    No Known Allergies  ROS Review of Systems  Constitutional: Negative.   HENT: Negative.   Eyes: Negative.   Respiratory: Negative.   Cardiovascular: Negative.   Gastrointestinal: Negative.   Endocrine: Negative.  Negative for polydipsia.  Genitourinary: Negative.  Negative for dysuria.  Musculoskeletal: Negative.   Allergic/Immunologic: Negative.   Neurological: Negative.  Negative for speech difficulty and light-headedness.  Hematological: Negative.   Psychiatric/Behavioral: Negative.  Negative for behavioral problems.      Objective:    Physical Exam  Constitutional: She is oriented to person, place, and time. She appears well-developed and well-nourished.  HENT:  Head: Atraumatic.  Eyes: Pupils are equal, round, and reactive to light.  Neck: No JVD present. No tracheal deviation present. No thyromegaly present.  Cardiovascular: Normal rate, regular rhythm and normal heart sounds.  Pulmonary/Chest: No respiratory distress. She has wheezes.  Abdominal: There is no abdominal tenderness. There is no guarding.  Musculoskeletal:        General: Tenderness present.     Cervical back: Normal range of motion and  neck supple.  Lymphadenopathy:    She has no cervical adenopathy.  Neurological: She is alert and oriented to person, place, and time. No cranial nerve deficit.  Skin: Skin is warm and dry.    BP (!) 144/96   Pulse (!) 103   Wt 195 lb 9.6 oz (88.7 kg)   BMI 42.33 kg/m  Wt Readings from Last 3 Encounters:  02/21/20 195 lb 9.6 oz (88.7 kg)  05/28/19 200 lb (90.7 kg)  05/25/19 200 lb (90.7 kg)  Health Maintenance Due  Topic Date Due  . HEMOGLOBIN A1C  Never done  . Hepatitis C Screening  Never done  . PNEUMOCOCCAL POLYSACCHARIDE VACCINE AGE 49-64 HIGH RISK  Never done  . FOOT EXAM  Never done  . OPHTHALMOLOGY EXAM  Never done  . COVID-19 Vaccine (1) Never done  . HIV Screening  Never done  . TETANUS/TDAP  Never done  . PAP SMEAR-Modifier  Never done  . COLONOSCOPY  Never done  . MAMMOGRAM  08/25/2018    There are no preventive care reminders to display for this patient.  No results found for: TSH Lab Results  Component Value Date   WBC 9.1 09/17/2018   HGB 12.3 09/17/2018   HCT 37.8 09/17/2018   MCV 87.1 09/17/2018   PLT 353 09/17/2018   Lab Results  Component Value Date   NA 137 09/17/2018   K 4.0 09/17/2018   CO2 23 09/17/2018   GLUCOSE 122 (H) 09/17/2018   BUN 16 09/17/2018   CREATININE 0.57 09/17/2018   BILITOT 0.3 09/17/2018   ALKPHOS 79 09/17/2018   AST 20 09/17/2018   ALT 25 09/17/2018   PROT 7.4 09/17/2018   ALBUMIN 4.3 09/17/2018   CALCIUM 9.5 09/17/2018   ANIONGAP 10 09/17/2018   No results found for: CHOL No results found for: HDL No results found for: LDLCALC No results found for: TRIG No results found for: CHOLHDL No results found for: HGBA1C    Assessment & Plan:   Problem List Items Addressed This Visit      Cardiovascular and Mediastinum   Hypertension    Blood pressure is labile.  Patient wasasked to lose weight and walk on a daily basis.      Relevant Orders   CBC with Differential/Platelet     Endocrine   Type 2  diabetes mellitus without complication, with long-term current use of insulin (HCC)    We will change insulin to Basaglar because patient insulin, price has gone up and she cannot afford it.      Relevant Orders   Hemoglobin A1c   Microalbumin, urine     Other   Class 2 obesity due to excess calories without serious comorbidity with body mass index (BMI) of 36.0 to 36.9 in adult    Patient is going to work on that.      Annual physical exam - Primary    She denies any chest pain or shortness of breath her main problem at the present time is exogenous obesity and control of diabetes.  She usually see a eye doctor on an annual basis.  She is due for a colonoscopy and mammogram and we are going to schedule her for that area.  Weight reduction is important for her at the present time.  She does not smoke or drink alcohol.      Relevant Orders   CBC with Differential/Platelet   Hemoglobin A1c   Lipid Panel With LDL/HDL Ratio   Microalbumin, urine   TSH   Ambulatory referral to Gastroenterology      No orders of the defined types were placed in this encounter.  1. Annual physical exam General physical examination is unremarkable she had a good vascular system at the present time .lt wrist pain has resolved - CBC with Differential/Platelet - Hemoglobin A1c - Lipid Panel With LDL/HDL Ratio - Microalbumin, urine - TSH - Ambulatory referral to Gastroenterology  2. Hypertension, unspecified type Labile. -   3. Type 2 diabetes mellitus without complication,  with long-term current use of insulin (HCC) Labile. - Hemoglobin A1c - Microalbumin, urine  4. Class 2 obesity due to excess calories without serious comorbidity with body mass index (BMI) of 36.0 to 36.9 in adult Walk daily. Follow-up: Return in about 3 months (around 05/23/2020).    Cletis Athens, MD

## 2020-02-21 NOTE — Addendum Note (Signed)
Addended by: Lacretia Nicks L on: 02/21/2020 11:11 AM   Modules accepted: Orders

## 2020-02-21 NOTE — Assessment & Plan Note (Signed)
Blood pressure is labile.  Patient wasasked to lose weight and walk on a daily basis.

## 2020-02-21 NOTE — Addendum Note (Signed)
Addended by: Alois Cliche on: 02/21/2020 11:17 AM   Modules accepted: Orders

## 2020-02-21 NOTE — Assessment & Plan Note (Signed)
We will change insulin to [place] because patient insulin, price has gone up and she cannot afford it.

## 2020-02-22 LAB — CBC WITH DIFFERENTIAL/PLATELET
Absolute Monocytes: 493 cells/uL (ref 200–950)
Basophils Absolute: 31 cells/uL (ref 0–200)
Basophils Relative: 0.4 %
Eosinophils Absolute: 177 cells/uL (ref 15–500)
Eosinophils Relative: 2.3 %
HCT: 36.3 % (ref 35.0–45.0)
Hemoglobin: 11.7 g/dL (ref 11.7–15.5)
Lymphs Abs: 2718 cells/uL (ref 850–3900)
MCH: 28.3 pg (ref 27.0–33.0)
MCHC: 32.2 g/dL (ref 32.0–36.0)
MCV: 87.7 fL (ref 80.0–100.0)
MPV: 9.7 fL (ref 7.5–12.5)
Monocytes Relative: 6.4 %
Neutro Abs: 4281 cells/uL (ref 1500–7800)
Neutrophils Relative %: 55.6 %
Platelets: 370 10*3/uL (ref 140–400)
RBC: 4.14 10*6/uL (ref 3.80–5.10)
RDW: 14.1 % (ref 11.0–15.0)
Total Lymphocyte: 35.3 %
WBC: 7.7 10*3/uL (ref 3.8–10.8)

## 2020-02-22 LAB — COMPLETE METABOLIC PANEL WITH GFR
AG Ratio: 2 (calc) (ref 1.0–2.5)
ALT: 17 U/L (ref 6–29)
AST: 17 U/L (ref 10–35)
Albumin: 4.4 g/dL (ref 3.6–5.1)
Alkaline phosphatase (APISO): 71 U/L (ref 37–153)
BUN: 13 mg/dL (ref 7–25)
CO2: 23 mmol/L (ref 20–32)
Calcium: 9.2 mg/dL (ref 8.6–10.4)
Chloride: 105 mmol/L (ref 98–110)
Creat: 0.64 mg/dL (ref 0.50–1.05)
GFR, Est African American: 115 mL/min/{1.73_m2} (ref >=60)
GFR, Est Non African American: 99 mL/min/{1.73_m2} (ref >=60)
Globulin: 2.2 g/dL (calc) (ref 1.9–3.7)
Glucose, Bld: 74 mg/dL (ref 65–99)
Potassium: 4.4 mmol/L (ref 3.5–5.3)
Sodium: 139 mmol/L (ref 135–146)
Total Bilirubin: 0.2 mg/dL (ref 0.2–1.2)
Total Protein: 6.6 g/dL (ref 6.1–8.1)

## 2020-02-22 LAB — HEMOGLOBIN A1C
Hgb A1c MFr Bld: 6.3 % of total Hgb — ABNORMAL HIGH (ref <5.7)
Mean Plasma Glucose: 134 (calc)
eAG (mmol/L): 7.4 (calc)

## 2020-02-22 LAB — TSH: TSH: 1.24 mIU/L (ref 0.40–4.50)

## 2020-02-22 LAB — MICROALBUMIN, URINE: Microalb, Ur: 0.5 mg/dL

## 2020-02-22 NOTE — Addendum Note (Signed)
Addended by: Cletis Athens on: 02/22/2020 06:19 PM   Modules accepted: Level of Service

## 2020-03-03 DIAGNOSIS — E113311 Type 2 diabetes mellitus with moderate nonproliferative diabetic retinopathy with macular edema, right eye: Secondary | ICD-10-CM | POA: Diagnosis not present

## 2020-03-04 ENCOUNTER — Ambulatory Visit
Admission: RE | Admit: 2020-03-04 | Discharge: 2020-03-04 | Disposition: A | Discharge status: Home / Self Care | Payer: 59 | Source: Ambulatory Visit | Attending: Internal Medicine | Admitting: Internal Medicine

## 2020-03-04 DIAGNOSIS — Z1231 Encounter for screening mammogram for malignant neoplasm of breast: Secondary | ICD-10-CM | POA: Insufficient documentation

## 2020-03-04 DIAGNOSIS — Z0000 Encounter for general adult medical examination without abnormal findings: Secondary | ICD-10-CM

## 2020-03-06 ENCOUNTER — Other Ambulatory Visit: Payer: Self-pay

## 2020-03-06 ENCOUNTER — Telehealth (INDEPENDENT_AMBULATORY_CARE_PROVIDER_SITE_OTHER): Payer: Self-pay | Admitting: Gastroenterology

## 2020-03-06 DIAGNOSIS — Z1211 Encounter for screening for malignant neoplasm of colon: Secondary | ICD-10-CM

## 2020-03-06 MED ORDER — NA SULFATE-K SULFATE-MG SULF 17.5-3.13-1.6 GM/177ML PO SOLN: 1.0000 | Freq: Once | ORAL | 0 refills | Status: AC

## 2020-03-06 NOTE — Progress Notes (Signed)
Gastroenterology Pre-Procedure Review  Request Date: Friday 03/21/20 Requesting Physician: Dr. Bonna Gains  PATIENT REVIEW QUESTIONS: The patient responded to the following health history questions as indicated:    1. Are you having any GI issues? no 2. Do you have a personal history of Polyps? no 3. Do you have a family history of Colon Cancer or Polyps? no 4. Diabetes Mellitus? yes (type 2 oral and insulin meds) 5. Joint replacements in the past 12 months?no 6. Major health problems in the past 3 months?no 7. Any artificial heart valves, MVP, or defibrillator?no    MEDICATIONS & ALLERGIES:    Patient reports the following regarding taking any anticoagulation/antiplatelet therapy:   Plavix, Coumadin, Eliquis, Xarelto, Lovenox, Pradaxa, Brilinta, or Effient? no Aspirin? yes (81 mg)  Patient confirms/reports the following medications:  Current Outpatient Medications  Medication Sig Dispense Refill  . acetaminophen (TYLENOL) 500 MG tablet Take 1,000 mg by mouth every 6 (six) hours as needed for moderate pain or headache.     Marland Kitchen amLODipine (NORVASC) 5 MG tablet Take 5 mg by mouth daily.    Marland Kitchen aspirin EC 81 MG tablet Take 81 mg by mouth daily.    . Black Cohosh 40 MG CAPS Take 40 mg by mouth 2 (two) times daily.    . diphenhydrAMINE (BENADRYL) 25 MG tablet Take 25 mg by mouth daily as needed for allergies.    Marland Kitchen glipiZIDE (GLUCOTROL) 10 MG tablet Take 10 mg by mouth 2 (two) times daily before a meal.    . Insulin Glargine (BASAGLAR KWIKPEN) 100 UNIT/ML Inject 0.4 mLs (40 Units total) into the skin daily. 3 pen 6  . latanoprost (XALATAN) 0.005 % ophthalmic solution Place 1 drop into both eyes at bedtime.   11  . lisinopril (ZESTRIL) 20 MG tablet Take 40 mg by mouth daily.    . meclizine (ANTIVERT) 25 MG tablet Take 25 mg by mouth 3 (three) times daily as needed for dizziness.     . metFORMIN (GLUCOPHAGE) 1000 MG tablet Take 1,000 mg by mouth 2 (two) times daily with a meal.    . Multiple  Vitamins-Minerals (CENTRAL-VITE PO) Take 1 tablet by mouth daily.    . fluticasone (FLONASE) 50 MCG/ACT nasal spray Place 1 spray into both nostrils at bedtime as needed for allergies or rhinitis.    Marland Kitchen gabapentin (NEURONTIN) 400 MG capsule Take 1 capsule (400 mg total) by mouth 3 (three) times daily. (Patient not taking: Reported on 02/21/2020) 60 capsule 3  . HYDROcodone-acetaminophen (NORCO) 5-325 MG tablet Take 1-2 tablets by mouth every 6 (six) hours as needed. (Patient not taking: Reported on 02/21/2020) 50 tablet 0  . meloxicam (MOBIC) 15 MG tablet Take 15 mg by mouth daily. (Patient not taking: Reported on 03/06/2020)    . meloxicam (MOBIC) 15 MG tablet Take 1 tablet (15 mg total) by mouth daily. (Patient not taking: Reported on 03/06/2020) 30 tablet 3   No current facility-administered medications for this visit.    Patient confirms/reports the following allergies:  Allergies  Allergen Reactions  . No Known Allergies     No orders of the defined types were placed in this encounter.   AUTHORIZATION INFORMATION Primary Insurance: 1D#: Group #:  Secondary Insurance: 1D#: Group #:  SCHEDULE INFORMATION: Date:  Time: Location:

## 2020-03-10 DIAGNOSIS — H43823 Vitreomacular adhesion, bilateral: Secondary | ICD-10-CM | POA: Diagnosis not present

## 2020-03-10 DIAGNOSIS — E113211 Type 2 diabetes mellitus with mild nonproliferative diabetic retinopathy with macular edema, right eye: Secondary | ICD-10-CM | POA: Diagnosis not present

## 2020-03-10 DIAGNOSIS — E113292 Type 2 diabetes mellitus with mild nonproliferative diabetic retinopathy without macular edema, left eye: Secondary | ICD-10-CM | POA: Diagnosis not present

## 2020-03-10 DIAGNOSIS — H35033 Hypertensive retinopathy, bilateral: Secondary | ICD-10-CM | POA: Diagnosis not present

## 2020-03-19 ENCOUNTER — Other Ambulatory Visit: Payer: Self-pay

## 2020-03-19 ENCOUNTER — Other Ambulatory Visit
Admission: RE | Admit: 2020-03-19 | Discharge: 2020-03-19 | Disposition: A | Discharge status: Home / Self Care | Payer: 59 | Source: Ambulatory Visit | Attending: Gastroenterology | Admitting: Gastroenterology

## 2020-03-19 DIAGNOSIS — Z01812 Encounter for preprocedural laboratory examination: Secondary | ICD-10-CM | POA: Diagnosis not present

## 2020-03-19 DIAGNOSIS — Z20822 Contact with and (suspected) exposure to covid-19: Secondary | ICD-10-CM | POA: Diagnosis not present

## 2020-03-19 LAB — SARS CORONAVIRUS 2 (TAT 6-24 HRS): SARS Coronavirus 2: NEGATIVE

## 2020-03-21 ENCOUNTER — Ambulatory Visit: Payer: 59 | Admitting: Registered Nurse

## 2020-03-21 ENCOUNTER — Encounter
Admission: RE | Disposition: A | Discharge status: Home / Self Care | Payer: Self-pay | Source: Home / Self Care | Attending: Gastroenterology

## 2020-03-21 ENCOUNTER — Encounter: Payer: Self-pay | Admitting: Gastroenterology

## 2020-03-21 ENCOUNTER — Other Ambulatory Visit: Payer: Self-pay

## 2020-03-21 ENCOUNTER — Ambulatory Visit
Admission: RE | Admit: 2020-03-21 | Discharge: 2020-03-21 | Disposition: A | Discharge status: Home / Self Care | Payer: 59 | Attending: Gastroenterology | Admitting: Gastroenterology

## 2020-03-21 DIAGNOSIS — D125 Benign neoplasm of sigmoid colon: Secondary | ICD-10-CM | POA: Diagnosis not present

## 2020-03-21 DIAGNOSIS — Z7982 Long term (current) use of aspirin: Secondary | ICD-10-CM | POA: Insufficient documentation

## 2020-03-21 DIAGNOSIS — K635 Polyp of colon: Secondary | ICD-10-CM | POA: Insufficient documentation

## 2020-03-21 DIAGNOSIS — I10 Essential (primary) hypertension: Secondary | ICD-10-CM | POA: Diagnosis not present

## 2020-03-21 DIAGNOSIS — Z794 Long term (current) use of insulin: Secondary | ICD-10-CM | POA: Insufficient documentation

## 2020-03-21 DIAGNOSIS — K639 Disease of intestine, unspecified: Secondary | ICD-10-CM | POA: Diagnosis not present

## 2020-03-21 DIAGNOSIS — E119 Type 2 diabetes mellitus without complications: Secondary | ICD-10-CM | POA: Insufficient documentation

## 2020-03-21 DIAGNOSIS — Z1211 Encounter for screening for malignant neoplasm of colon: Secondary | ICD-10-CM | POA: Insufficient documentation

## 2020-03-21 DIAGNOSIS — Z79899 Other long term (current) drug therapy: Secondary | ICD-10-CM | POA: Diagnosis not present

## 2020-03-21 DIAGNOSIS — E785 Hyperlipidemia, unspecified: Secondary | ICD-10-CM | POA: Insufficient documentation

## 2020-03-21 DIAGNOSIS — Z87891 Personal history of nicotine dependence: Secondary | ICD-10-CM | POA: Insufficient documentation

## 2020-03-21 DIAGNOSIS — G4730 Sleep apnea, unspecified: Secondary | ICD-10-CM | POA: Diagnosis not present

## 2020-03-21 DIAGNOSIS — Z6839 Body mass index (BMI) 39.0-39.9, adult: Secondary | ICD-10-CM | POA: Diagnosis not present

## 2020-03-21 HISTORY — PX: COLONOSCOPY WITH PROPOFOL: SHX5780

## 2020-03-21 LAB — GLUCOSE, CAPILLARY: Glucose-Capillary: 78 mg/dL (ref 70–99)

## 2020-03-21 SURGERY — COLONOSCOPY WITH PROPOFOL
Anesthesia: General

## 2020-03-21 MED ORDER — LIDOCAINE HCL (PF) 2 % IJ SOLN
INTRAMUSCULAR | Status: AC
  Filled 2020-03-21: qty 5

## 2020-03-21 MED ORDER — PROPOFOL 10 MG/ML IV BOLUS
INTRAVENOUS | Status: DC | PRN
  Administered 2020-03-21: 70 mg via INTRAVENOUS

## 2020-03-21 MED ORDER — MIDAZOLAM HCL 2 MG/2ML IJ SOLN
INTRAMUSCULAR | Status: AC
  Filled 2020-03-21: qty 2

## 2020-03-21 MED ORDER — MIDAZOLAM HCL 2 MG/2ML IJ SOLN
INTRAMUSCULAR | Status: DC | PRN
  Administered 2020-03-21: 2 mg via INTRAVENOUS

## 2020-03-21 MED ORDER — SODIUM CHLORIDE 0.9 % IV SOLN: INTRAVENOUS | Status: DC

## 2020-03-21 MED ORDER — LIDOCAINE HCL (CARDIAC) PF 100 MG/5ML IV SOSY
PREFILLED_SYRINGE | INTRAVENOUS | Status: DC | PRN
  Administered 2020-03-21: 40 mg via INTRAVENOUS

## 2020-03-21 MED ORDER — PROPOFOL 500 MG/50ML IV EMUL
INTRAVENOUS | Status: DC | PRN
  Administered 2020-03-21: 150 ug/kg/min via INTRAVENOUS

## 2020-03-21 MED ORDER — PROPOFOL 500 MG/50ML IV EMUL
INTRAVENOUS | Status: AC
  Filled 2020-03-21: qty 50

## 2020-03-21 NOTE — H&P (Signed)
Vonda Antigua, MD [address], [place], [place], [place], [address] [address], [place], [place], [place], [address] Phone: [phone]  Fax: [phone]  Primary Care Physician:  Cletis Athens, MD   Pre-Procedure History & Physical: HPI:  Tamara Wells is a 59 y.o. female is here for a colonoscopy.   Past Medical History:  Diagnosis Date  . Diabetes mellitus without complication ([place])    Dx 2006  . Hyperlipidemia   . Hypertension   . Sleep apnea   . Vertigo    2002    Past Surgical History:  Procedure Laterality Date  . ABDOMINAL HYSTERECTOMY     2000  . CARPAL TUNNEL RELEASE Left   . CARPAL TUNNEL RELEASE Right 05/28/2019   Procedure: CARPAL TUNNEL RELEASE;  Surgeon: Earnestine Leys, MD;  Location: [facility] ORS;  Service: Orthopedics;  Laterality: Right;  . TONSILLECTOMY      Prior to Admission medications   Medication Sig Start Date End Date Taking? Authorizing Provider  glipiZIDE (GLUCOTROL) 10 MG tablet Take 10 mg by mouth 2 (two) times daily before a meal.   Yes Masoud, Viann Shove, MD  Insulin Glargine (BASAGLAR KWIKPEN) 100 UNIT/ML Inject 0.4 mLs (40 Units total) into the skin daily. 02/21/20  Yes Masoud, Viann Shove, MD  latanoprost (XALATAN) 0.005 % ophthalmic solution Place 1 drop into both eyes at bedtime.  09/13/17  Yes [provider]  lisinopril (ZESTRIL) 20 MG tablet Take 40 mg by mouth daily.   Yes [provider]  metFORMIN (GLUCOPHAGE) 1000 MG tablet Take 1,000 mg by mouth 2 (two) times daily with a meal.   Yes Masoud, Viann Shove, MD  acetaminophen (TYLENOL) 500 MG tablet Take 1,000 mg by mouth every 6 (six) hours as needed for moderate pain or headache.     [provider]  amLODipine (NORVASC) 5 MG tablet Take 5 mg by mouth daily.    [provider]  aspirin EC 81 MG tablet Take 81 mg by mouth daily.    [provider]  Black Cohosh 40 MG CAPS Take 40 mg by mouth 2 (two) times daily.    [provider]    diphenhydrAMINE (BENADRYL) 25 MG tablet Take 25 mg by mouth daily as needed for allergies.    [provider]  fluticasone (FLONASE) 50 MCG/ACT nasal spray Place 1 spray into both nostrils at bedtime as needed for allergies or rhinitis.    [provider]  gabapentin (NEURONTIN) 400 MG capsule Take 1 capsule (400 mg total) by mouth 3 (three) times daily. Patient not taking: Reported on 02/21/2020 05/28/19   Earnestine Leys, MD  HYDROcodone-acetaminophen [facility]) 5-325 MG tablet Take 1-2 tablets by mouth every 6 (six) hours as needed. Patient not taking: Reported on 02/21/2020 05/28/19   Earnestine Leys, MD  meclizine (ANTIVERT) 25 MG tablet Take 25 mg by mouth 3 (three) times daily as needed for dizziness.     Cletis Athens, MD  meloxicam (MOBIC) 15 MG tablet Take 15 mg by mouth daily. Patient not taking: Reported on 03/06/2020    [provider]  meloxicam (MOBIC) 15 MG tablet Take 1 tablet (15 mg total) by mouth daily. Patient not taking: Reported on 03/06/2020 05/28/19   Earnestine Leys, MD  Multiple Vitamins-Minerals (CENTRAL-VITE PO) Take 1 tablet by mouth daily.    [provider]    Allergies as of 03/06/2020 - Review Complete 03/06/2020  Allergen Reaction Noted  . No known allergies  03/06/2020    Family History  Problem Relation Age of Onset  . Diabetes Mother   . Diabetes Father   . Diabetes Maternal Grandmother   . Breast cancer Neg Hx     Social History   Socioeconomic History  . Marital status: Married    Spouse name: Not on file  . Number of children: Not on file  . Years of education: Not on file  . Highest education level: Not on file  Occupational History  . Not on file  Tobacco Use  . Smoking status: Former Smoker    Types: Cigarettes    Quit date: 09/27/2004    Years since quitting: 15.4  . Smokeless tobacco: Never Used  Vaping Use  . Vaping Use: Never used  Substance and Sexual Activity  . Alcohol use: No  . Drug use: No  .  Sexual activity: Not on file  Other Topics Concern  . Not on file  Social History Narrative  . Not on file   Social Determinants of Health   Financial Resource Strain:   . Difficulty of Paying Living Expenses:   Food Insecurity:   . Worried About Charity fundraiser in the Last Year:   . Arboriculturist in the Last Year:   Transportation Needs:   . Film/video editor (Medical):   Marland Kitchen Lack of Transportation (Non-Medical):   Physical Activity:   . Days of Exercise per Week:   . Minutes of Exercise per Session:   Stress:   . Feeling of Stress :   Social Connections:   . Frequency of Communication with Friends and Family:   . Frequency of Social Gatherings with Friends and Family:   . Attends Religious Services:   . Active Member of Clubs or Organizations:   . Attends Archivist Meetings:   Marland Kitchen Marital Status:   Intimate Partner Violence:   . Fear of Current or Ex-Partner:   . Emotionally Abused:   Marland Kitchen Physically Abused:   . Sexually Abused:     Review of Systems: See HPI, otherwise negative ROS  Physical Exam: Pulse 78   Temp (!) 97.3 F (36.3 C)   Resp 17   Ht 4\' 11"  (1.499 m)   Wt 88.5 kg   SpO2 99%   BMI 39.39 kg/m  General:   Alert,  pleasant and cooperative in NAD Head:  Normocephalic and atraumatic. Neck:  Supple; no masses or thyromegaly. Lungs:  Clear throughout to auscultation, normal respiratory effort.    Heart:  +S1, +S2, Regular rate and rhythm, No edema. Abdomen:  Soft, nontender and nondistended. Normal bowel sounds, without guarding, and without rebound.   Neurologic:  Alert and  oriented x4;  grossly normal neurologically.  Impression/Plan: ENDYA AUSTIN is here for a colonoscopy to be performed for average risk screening.  Risks, benefits, limitations, and alternatives regarding  colonoscopy have been reviewed with the patient.  Questions have been answered.  All parties agreeable.   Virgel Manifold, MD  03/21/2020, 10:12  AM

## 2020-03-21 NOTE — Transfer of Care (Signed)
Immediate Anesthesia Transfer of Care Note  Patient: Tamara Wells  Procedure(s) Performed: Procedure(s): COLONOSCOPY WITH PROPOFOL (N/A)  Patient Location: PACU and Endoscopy Unit  Anesthesia Type:General  Level of Consciousness: sedated  Airway & Oxygen Therapy: Patient Spontanous Breathing and Patient connected to nasal cannula oxygen  Post-op Assessment: Report given to RN and Post -op Vital signs reviewed and stable  Post vital signs: Reviewed and stable  Last Vitals:  Vitals:   03/21/20 0946 03/21/20 1100  BP:  92/65  Pulse: 78 91  Resp: 17 15  Temp: (!) 36.3 C (!) 35.8 C  SpO2: 29% 04%    Complications: No apparent anesthesia complications

## 2020-03-21 NOTE — Anesthesia Preprocedure Evaluation (Signed)
Anesthesia Evaluation  Patient identified by MRN, date of birth, ID band Patient awake    Reviewed: Allergy & Precautions, H&P , NPO status , Patient's Chart, lab work & pertinent test results, reviewed documented beta blocker date and time   History of Anesthesia Complications Negative for: history of anesthetic complications  Airway Mallampati: IV  TM Distance: >3 FB Neck ROM: full    Dental  (+) Dental Advidsory Given, Upper Dentures, Poor Dentition   Pulmonary neg shortness of breath, sleep apnea , neg COPD, neg recent URI, former smoker,    Pulmonary exam normal        Cardiovascular Exercise Tolerance: Good hypertension, (-) angina(-) Past MI and (-) Cardiac Stents Normal cardiovascular exam(-) dysrhythmias (-) Valvular Problems/Murmurs     Neuro/Psych negative neurological ROS  negative psych ROS   GI/Hepatic negative GI ROS, Neg liver ROS,   Endo/Other  diabetesMorbid obesity  Renal/GU negative Renal ROS  negative genitourinary   Musculoskeletal   Abdominal   Peds  Hematology negative hematology ROS (+)   Anesthesia Other Findings Past Medical History: No date: Diabetes mellitus without complication ([place])     Comment:  Dx 2006 No date: Hyperlipidemia No date: Hypertension No date: Sleep apnea No date: Vertigo     Comment:  2002   Reproductive/Obstetrics negative OB ROS                             Anesthesia Physical  Anesthesia Plan  ASA: III  Anesthesia Plan: General   Post-op Pain Management:    Induction: Intravenous  PONV Risk Score and Plan: 3 and Propofol infusion and TIVA  Airway Management Planned: Natural Airway and Nasal Cannula  Additional Equipment:   Intra-op Plan:   Post-operative Plan:   Informed Consent: I have reviewed the patients History and Physical, chart, labs and discussed the procedure including the risks, benefits and alternatives for  the proposed anesthesia with the patient or authorized representative who has indicated his/her understanding and acceptance.     Dental Advisory Given  Plan Discussed with: Anesthesiologist, CRNA and Surgeon  Anesthesia Plan Comments:         Anesthesia Quick Evaluation

## 2020-03-21 NOTE — Op Note (Signed)
[facility] Patient Name: Tamara Wells Procedure Date: 03/21/2020 10:15 AM MRN: [id number] Account #: [id number] Date of Birth: Jun 26, 1961 Admit Type: Outpatient Age: 59 Room: [facility] ENDO ROOM 4 Gender: Female Note Status: Finalized Procedure:             Colonoscopy Indications:           Screening for colorectal malignant neoplasm Providers:             Weston Fulco B. Bonna Gains MD, MD Referring MD:          Cletis Athens, MD (Referring MD) Medicines:             Monitored Anesthesia Care Complications:         No immediate complications. Procedure:             Pre-Anesthesia Assessment:                        - ASA Grade Assessment: II - A patient with mild                         systemic disease.                        - Prior to the procedure, a History and Physical was                         performed, and patient medications, allergies and                         sensitivities were reviewed. The patient's tolerance                         of previous anesthesia was reviewed.                        - The risks and benefits of the procedure and the                         sedation options and risks were discussed with the                         patient. All questions were answered and informed                         consent was obtained.                        - Patient identification and proposed procedure were                         verified prior to the procedure by the physician, the                         nurse, the anesthesiologist, the anesthetist and the                         technician. The procedure was verified in the                         procedure room.  After obtaining informed consent, the colonoscope was                         passed under direct vision. Throughout the procedure,                         the patient's blood pressure, pulse, and oxygen                         saturations were  monitored continuously. The                         Colonoscope was introduced through the anus and                         advanced to the the cecum, identified by appendiceal                         orifice and ileocecal valve. The colonoscopy was                         performed with ease. The patient tolerated the                         procedure well. The quality of the bowel preparation                         was poor. Findings:      The perianal and digital rectal examinations were normal.      A localized area of thickened folds of the mucosa was found at the       ileocecal valve. Biopsies were taken with a cold forceps for histology.      A 3 to 4 mm polyp was found in the sigmoid colon. The polyp was sessile.       The polyp was removed with a cold biopsy forceps. Resection and       retrieval were complete.      The exam was otherwise without abnormality.      The rectum, sigmoid colon, descending colon, transverse colon, ascending       colon and cecum appeared normal.      The retroflexed view of the distal rectum and anal verge was normal and       showed no anal or rectal abnormalities. Impression:            - Preparation of the colon was poor.                        - Thickened folds of the mucosa at the ileocecal                         valve. Biopsied.                        - One 3 to 4 mm polyp in the sigmoid colon, removed                         with a cold biopsy forceps. Resected and retrieved.                        -  The examination was otherwise normal.                        - The rectum, sigmoid colon, descending colon,                         transverse colon, ascending colon and cecum are normal.                        - The distal rectum and anal verge are normal on                         retroflexion view. Recommendation:        - Await pathology results.                        - Discharge patient to home (with escort).                        -  Advance diet as tolerated.                        - Continue present medications.                        - - Repeat colonoscopy in 1 year. (Solid stool                         material was cleared as much as possible by moving it                         with water jet or suctioning. Hence 1 year repeat is                         recommended with low fiber diet for 1 week prior to                         the procedure).                        - The findings and recommendations were discussed with                         the patient.                        - The findings and recommendations were discussed with                         the patient's family.                        - Return to primary care physician as previously                         scheduled. Procedure Code(s):     --- Professional ---                        [phone], Colonoscopy, flexible; with biopsy, single or  multiple Diagnosis Code(s):     --- Professional ---                        Z12.11, Encounter for screening for malignant neoplasm                         of colon                        K63.5, Polyp of colon CPT copyright 2019 American Medical Association. All rights reserved. The codes documented in this report are preliminary and upon coder review may  be revised to meet current compliance requirements.  Vonda Antigua, MD Margretta Sidle B. Bonna Gains MD, MD 03/21/2020 11:04:14 AM This report has been signed electronically. Number of Addenda: 0 Note Initiated On: 03/21/2020 10:15 AM Scope Withdrawal Time: 0 hours 20 minutes 52 seconds  Total Procedure Duration: 0 hours 27 minutes 57 seconds  Estimated Blood Loss:  Estimated blood loss: none.      [facility]

## 2020-03-21 NOTE — Anesthesia Postprocedure Evaluation (Signed)
Anesthesia Post Note  Patient: Tamara Wells  Procedure(s) Performed: COLONOSCOPY WITH PROPOFOL (N/A )  Patient location during evaluation: Endoscopy Anesthesia Type: General Level of consciousness: awake and alert Pain management: pain level controlled Vital Signs Assessment: post-procedure vital signs reviewed and stable Respiratory status: spontaneous breathing, nonlabored ventilation, respiratory function stable and patient connected to nasal cannula oxygen Cardiovascular status: blood pressure returned to baseline and stable Postop Assessment: no apparent nausea or vomiting Anesthetic complications: no   No complications documented.   Last Vitals:  Vitals:   03/21/20 1100 03/21/20 1130  BP: 92/65 138/90  Pulse: 91   Resp: 15   Temp: (!) 35.8 C   SpO2: 99%     Last Pain:  Vitals:   03/21/20 1130  TempSrc:   PainSc: 0-No pain                 Martha Clan

## 2020-03-21 NOTE — Anesthesia Procedure Notes (Signed)
Date/Time: 03/21/2020 10:20 AM Performed by: Doreen Salvage, CRNA Pre-anesthesia Checklist: Patient identified, Emergency Drugs available, Suction available and Patient being monitored Patient Re-evaluated:Patient Re-evaluated prior to induction Oxygen Delivery Method: Nasal cannula Induction Type: IV induction Dental Injury: Teeth and Oropharynx as per pre-operative assessment  Comments: Nasal cannula with etCO2 monitoring

## 2020-03-24 ENCOUNTER — Encounter: Payer: Self-pay | Admitting: Gastroenterology

## 2020-03-24 LAB — SURGICAL PATHOLOGY

## 2020-04-01 ENCOUNTER — Other Ambulatory Visit: Payer: Self-pay

## 2020-04-01 ENCOUNTER — Encounter: Payer: Self-pay | Admitting: Internal Medicine

## 2020-04-01 ENCOUNTER — Ambulatory Visit (INDEPENDENT_AMBULATORY_CARE_PROVIDER_SITE_OTHER): Payer: 59 | Admitting: Internal Medicine

## 2020-04-01 VITALS — BP 134/75 | HR 84 | Ht 59.0 in | Wt 195.7 lb

## 2020-04-01 DIAGNOSIS — Z6836 Body mass index (BMI) 36.0-36.9, adult: Secondary | ICD-10-CM | POA: Diagnosis not present

## 2020-04-01 DIAGNOSIS — I10 Essential (primary) hypertension: Secondary | ICD-10-CM | POA: Diagnosis not present

## 2020-04-01 DIAGNOSIS — M67442 Ganglion, left hand: Secondary | ICD-10-CM

## 2020-04-01 DIAGNOSIS — Z794 Long term (current) use of insulin: Secondary | ICD-10-CM

## 2020-04-01 DIAGNOSIS — E6609 Other obesity due to excess calories: Secondary | ICD-10-CM

## 2020-04-01 DIAGNOSIS — E119 Type 2 diabetes mellitus without complications: Secondary | ICD-10-CM

## 2020-04-01 LAB — GLUCOSE, POCT (MANUAL RESULT ENTRY): POC Glucose: 87 mg/dl (ref 70–99)

## 2020-04-01 NOTE — Patient Instructions (Signed)

## 2020-04-01 NOTE — Assessment & Plan Note (Signed)
Ref to [employer]

## 2020-04-01 NOTE — Assessment & Plan Note (Signed)
-   The patient's blood sugar is under control on glargine  40 units  Daily , reduced to  20 units. - The patient will continue the current treatment regimen.  - I encouraged the patient to regularly check blood sugar.  - I encouraged the patient to monitor diet. I encouraged the patient to eat low-carb and low-sugar to help prevent blood sugar spikes.  - I encouraged the patient to continue following their prescribed treatment plan for diabetes - I informed the patient to get help if blood sugar drops below 54mg /dL, or if suddenly have trouble thinking clearly or breathing.

## 2020-04-01 NOTE — Progress Notes (Signed)
Established Patient Office Visit  SUBJECTIVE:  Subjective  Patient ID: Tamara Wells, female    DOB: 03-17-1961  Age: 59 y.o. MRN: [id number]  CC:  Chief Complaint  Patient presents with  . Diabetes    Patient started new insulin, returning for a recheck on blood sugar    HPI Tamara Wells is a 59 y.o. female presenting today for a recheck on her blood sugar.   She has not eaten anything today yet. Her blood sugar today when checked after taking her medication was 84. She notes that she works second shift, so she normally doesn't wake up until 11:30am or 12:00pm.   She has an issue with diabetic retinopathy and has an appointment with an optometrist in 04/2020.   Past Medical History:  Diagnosis Date  . Diabetes mellitus without complication ([place])    Dx 2006  . Hyperlipidemia   . Hypertension   . Sleep apnea   . Vertigo    2002    Past Surgical History:  Procedure Laterality Date  . ABDOMINAL HYSTERECTOMY     2000  . CARPAL TUNNEL RELEASE Left   . CARPAL TUNNEL RELEASE Right 05/28/2019   Procedure: CARPAL TUNNEL RELEASE;  Surgeon: Earnestine Leys, MD;  Location: [facility] ORS;  Service: Orthopedics;  Laterality: Right;  . COLONOSCOPY WITH PROPOFOL N/A 03/21/2020   Procedure: COLONOSCOPY WITH PROPOFOL;  Surgeon: Virgel Manifold, MD;  Location: [facility] ENDOSCOPY;  Service: Endoscopy;  Laterality: N/A;  . TONSILLECTOMY      Family History  Problem Relation Age of Onset  . Diabetes Mother   . Diabetes Father   . Diabetes Maternal Grandmother   . Breast cancer Neg Hx     Social History   Socioeconomic History  . Marital status: Married    Spouse name: Not on file  . Number of children: Not on file  . Years of education: Not on file  . Highest education level: Not on file  Occupational History  . Not on file  Tobacco Use  . Smoking status: Former Smoker    Types: Cigarettes    Quit date: 09/27/2004    Years since quitting: 15.5  . Smokeless tobacco: Never  Used  Vaping Use  . Vaping Use: Never used  Substance and Sexual Activity  . Alcohol use: No  . Drug use: No  . Sexual activity: Not on file  Other Topics Concern  . Not on file  Social History Narrative  . Not on file   Social Determinants of Health   Financial Resource Strain:   . Difficulty of Paying Living Expenses:   Food Insecurity:   . Worried About Charity fundraiser in the Last Year:   . Arboriculturist in the Last Year:   Transportation Needs:   . Film/video editor (Medical):   Marland Kitchen Lack of Transportation (Non-Medical):   Physical Activity:   . Days of Exercise per Week:   . Minutes of Exercise per Session:   Stress:   . Feeling of Stress :   Social Connections:   . Frequency of Communication with Friends and Family:   . Frequency of Social Gatherings with Friends and Family:   . Attends Religious Services:   . Active Member of Clubs or Organizations:   . Attends Archivist Meetings:   Marland Kitchen Marital Status:   Intimate Partner Violence:   . Fear of Current or Ex-Partner:   . Emotionally Abused:   Marland Kitchen Physically Abused:   .  Sexually Abused:      Current Outpatient Medications:  .  acetaminophen (TYLENOL) 500 MG tablet, Take 1,000 mg by mouth every 6 (six) hours as needed for moderate pain or headache. , Disp: , Rfl:  .  amLODipine (NORVASC) 5 MG tablet, Take 5 mg by mouth daily., Disp: , Rfl:  .  aspirin EC 81 MG tablet, Take 81 mg by mouth daily., Disp: , Rfl:  .  Black Cohosh 40 MG CAPS, Take 40 mg by mouth 2 (two) times daily., Disp: , Rfl:  .  diphenhydrAMINE (BENADRYL) 25 MG tablet, Take 25 mg by mouth daily as needed for allergies., Disp: , Rfl:  .  fluticasone (FLONASE) 50 MCG/ACT nasal spray, Place 1 spray into both nostrils at bedtime as needed for allergies or rhinitis., Disp: , Rfl:  .  gabapentin (NEURONTIN) 400 MG capsule, Take 1 capsule (400 mg total) by mouth 3 (three) times daily. (Patient not taking: Reported on 02/21/2020), Disp: 60  capsule, Rfl: 3 .  glipiZIDE (GLUCOTROL) 10 MG tablet, Take 10 mg by mouth 2 (two) times daily before a meal., Disp: , Rfl:  .  HYDROcodone-acetaminophen (NORCO) 5-325 MG tablet, Take 1-2 tablets by mouth every 6 (six) hours as needed. (Patient not taking: Reported on 02/21/2020), Disp: 50 tablet, Rfl: 0 .  Insulin Glargine (BASAGLAR KWIKPEN) 100 UNIT/ML, Inject 0.4 mLs (40 Units total) into the skin daily., Disp: 3 pen, Rfl: 6 .  latanoprost (XALATAN) 0.005 % ophthalmic solution, Place 1 drop into both eyes at bedtime. , Disp: , Rfl: 11 .  lisinopril (ZESTRIL) 20 MG tablet, Take 40 mg by mouth daily., Disp: , Rfl:  .  meclizine (ANTIVERT) 25 MG tablet, Take 25 mg by mouth 3 (three) times daily as needed for dizziness. , Disp: , Rfl:  .  meloxicam (MOBIC) 15 MG tablet, Take 15 mg by mouth daily. (Patient not taking: Reported on 03/06/2020), Disp: , Rfl:  .  meloxicam (MOBIC) 15 MG tablet, Take 1 tablet (15 mg total) by mouth daily. (Patient not taking: Reported on 03/06/2020), Disp: 30 tablet, Rfl: 3 .  metFORMIN (GLUCOPHAGE) 1000 MG tablet, Take 1,000 mg by mouth 2 (two) times daily with a meal., Disp: , Rfl:  .  Multiple Vitamins-Minerals (CENTRAL-VITE PO), Take 1 tablet by mouth daily., Disp: , Rfl:    Allergies  Allergen Reactions  . No Known Allergies     ROS Review of Systems   OBJECTIVE:    Physical Exam  BP 134/75   Pulse 84   Ht 4\' 11"  (1.499 m)   Wt 195 lb 11.2 oz (88.8 kg)   SpO2 97%   BMI 39.53 kg/m  Wt Readings from Last 3 Encounters:  04/01/20 195 lb 11.2 oz (88.8 kg)  03/21/20 195 lb (88.5 kg)  02/21/20 195 lb 9.6 oz (88.7 kg)    Health Maintenance Due  Topic Date Due  . Hepatitis C Screening  Never done  . PNEUMOCOCCAL POLYSACCHARIDE VACCINE AGE 58-64 HIGH RISK  Never done  . FOOT EXAM  Never done  . OPHTHALMOLOGY EXAM  Never done  . COVID-19 Vaccine (1) Never done  . HIV Screening  Never done  . TETANUS/TDAP  Never done  . PAP SMEAR-Modifier  Never done    Colonoscopy 03/21/2020: - Preparation of the colon was poor. - Thickened folds of the mucosa at the ileocecal valve. Biopsied. - One 3 to 4 mm polyp in the sigmoid colon, removed with a cold biopsy forceps. Resected and retrieved. -  The examination was otherwise normal. - The rectum, sigmoid colon, descending colon, transverse colon, ascending colon and cecum are normal. - The distal rectum and anal verge are normal on retroflexion view.  Mammogram 03/04/2020: No mammographic evidence of malignancy.  There are no preventive care reminders to display for this patient.  CBC Latest Ref Rng & Units 02/21/2020 09/17/2018  WBC 3.8 - 10.8 Thousand/uL 7.7 9.1  Hemoglobin 11.7 - 15.5 g/dL 11.7 12.3  Hematocrit 35 - 45 % 36.3 37.8  Platelets 140 - 400 Thousand/uL 370 353   CMP Latest Ref Rng & Units 02/21/2020 09/17/2018  Glucose 65 - 99 mg/dL 74 122(H)  BUN 7 - 25 mg/dL 13 16  Creatinine 0.50 - 1.05 mg/dL 0.64 0.57  Sodium 135 - 146 mmol/L 139 137  Potassium 3.5 - 5.3 mmol/L 4.4 4.0  Chloride 98 - 110 mmol/L 105 104  CO2 20 - 32 mmol/L 23 23  Calcium 8.6 - 10.4 mg/dL 9.2 9.5  Total Protein 6.1 - 8.1 g/dL 6.6 7.4  Total Bilirubin 0.2 - 1.2 mg/dL 0.2 0.3  Alkaline Phos 38 - 126 U/L - 79  AST 10 - 35 U/L 17 20  ALT 6 - 29 U/L 17 25    Lab Results  Component Value Date   TSH 1.24 02/21/2020   Lab Results  Component Value Date   ALBUMIN 4.3 09/17/2018   ANIONGAP 10 09/17/2018   No results found for: CHOL, HDL, LDLCALC, CHOLHDL No results found for: TRIG Lab Results  Component Value Date   HGBA1C 6.3 (H) 02/21/2020      ASSESSMENT & PLAN:   Problem List Items Addressed This Visit      Cardiovascular and Mediastinum   Hypertension    - Today, the patient's blood pressure is well managed on. - The patient will continue the current treatment regimen.  - I encouraged the patient to eat a low-sodium diet to help control blood pressure. - I encouraged the patient to live an  active lifestyle and complete activities that increases heart rate to 85% target heart rate at least 5 times per week for one hour.            Endocrine   Type 2 diabetes mellitus without complication, with long-term current use of insulin ([place]) - Primary    - The patient's blood sugar is under control on glargine  40 units  Daily , reduced to  20 units. - The patient will continue the current treatment regimen.  - I encouraged the patient to regularly check blood sugar.  - I encouraged the patient to monitor diet. I encouraged the patient to eat low-carb and low-sugar to help prevent blood sugar spikes.  - I encouraged the patient to continue following their prescribed treatment plan for diabetes - I informed the patient to get help if blood sugar drops below 54mg /dL, or if suddenly have trouble thinking clearly or breathing.         Relevant Orders   POCT glucose (manual entry) (Completed)     Musculoskeletal and Integument   Ganglion cyst of tendon sheath of left hand    Ref to orthoe        Other   Class 2 obesity due to excess calories without serious comorbidity with body mass index (BMI) of 36.0 to 36.9 in adult    - I encouraged the patient to lose weight.  - I educated them on making healthy dietary choices including eating more fruits and vegetables and  less fried foods. - I encouraged the patient to exercise more, and educated on the benefits of exercise including weight loss, diabetes prevention, and hypertension management.           No orders of the defined types were placed in this encounter.  1. Type 2 diabetes mellitus without complication, with long-term current use of insulin (HCC)  - POCT glucose (manual entry)  2. Class 2 obesity due to excess calories without serious comorbidity with body mass index (BMI) of 36.0 to 36.9 in adult   3. Hypertension, unspecified type  stable   4. Ganglion cyst of tendon sheath of left hand reft o  ortho   Follow-up: Return in about 2 months (around 06/02/2020) for Diabetes: Blood Sugar recheck.    Dr. Jane Canary [facility] [address], [place], [place] [address]   By signing my name below, I, [employer], attest that this documentation has been prepared under the direction and in the presence of Cletis Athens, MD. Electronically Signed: Cletis Athens, MD 04/01/20, 11:25 AM   { I personally performed the services described in this documentation, which was SCRIBED in my presence. The recorded information has been reviewed and considered accurate. It has been edited as necessary during review. Cletis Athens, MD

## 2020-04-01 NOTE — Assessment & Plan Note (Signed)
-   Today, the patient's blood pressure is well managed on  . - The patient will continue the current treatment regimen.  - I encouraged the patient to eat a low-sodium diet to help control blood pressure. - I encouraged the patient to live an active lifestyle and complete activities that increases heart rate to 85% target heart rate at least 5 times per week for one hour.     

## 2020-04-01 NOTE — Assessment & Plan Note (Signed)
-   I encouraged the patient to lose weight.  - I educated them on making healthy dietary choices including eating more fruits and vegetables and less fried foods. - I encouraged the patient to exercise more, and educated on the benefits of exercise including weight loss, diabetes prevention, and hypertension management.   

## 2020-04-03 ENCOUNTER — Encounter: Payer: Self-pay | Admitting: Gastroenterology

## 2020-04-29 DIAGNOSIS — E113211 Type 2 diabetes mellitus with mild nonproliferative diabetic retinopathy with macular edema, right eye: Secondary | ICD-10-CM | POA: Diagnosis not present

## 2020-04-29 DIAGNOSIS — E113292 Type 2 diabetes mellitus with mild nonproliferative diabetic retinopathy without macular edema, left eye: Secondary | ICD-10-CM | POA: Diagnosis not present

## 2020-04-29 DIAGNOSIS — H35033 Hypertensive retinopathy, bilateral: Secondary | ICD-10-CM | POA: Diagnosis not present

## 2020-04-29 DIAGNOSIS — H43823 Vitreomacular adhesion, bilateral: Secondary | ICD-10-CM | POA: Diagnosis not present

## 2020-05-15 DIAGNOSIS — M654 Radial styloid tenosynovitis [de Quervain]: Secondary | ICD-10-CM | POA: Diagnosis not present

## 2020-06-03 ENCOUNTER — Ambulatory Visit: Payer: 59 | Admitting: Internal Medicine

## 2020-06-23 ENCOUNTER — Other Ambulatory Visit: Payer: Self-pay | Admitting: Internal Medicine

## 2020-07-15 ENCOUNTER — Other Ambulatory Visit: Payer: Self-pay | Admitting: Internal Medicine

## 2020-09-30 DIAGNOSIS — E113211 Type 2 diabetes mellitus with mild nonproliferative diabetic retinopathy with macular edema, right eye: Secondary | ICD-10-CM | POA: Diagnosis not present

## 2020-09-30 DIAGNOSIS — H35033 Hypertensive retinopathy, bilateral: Secondary | ICD-10-CM | POA: Diagnosis not present

## 2020-09-30 DIAGNOSIS — H43823 Vitreomacular adhesion, bilateral: Secondary | ICD-10-CM | POA: Diagnosis not present

## 2020-09-30 DIAGNOSIS — E113292 Type 2 diabetes mellitus with mild nonproliferative diabetic retinopathy without macular edema, left eye: Secondary | ICD-10-CM | POA: Diagnosis not present

## 2020-10-06 ENCOUNTER — Other Ambulatory Visit: Payer: Self-pay | Admitting: Internal Medicine

## 2020-11-03 ENCOUNTER — Other Ambulatory Visit: Payer: Self-pay | Admitting: Internal Medicine

## 2020-11-27 ENCOUNTER — Other Ambulatory Visit: Payer: Self-pay | Admitting: Optometry

## 2020-12-12 ENCOUNTER — Other Ambulatory Visit: Payer: Self-pay | Admitting: *Deleted

## 2021-01-06 ENCOUNTER — Other Ambulatory Visit: Payer: Self-pay

## 2021-01-06 ENCOUNTER — Encounter: Payer: Self-pay | Admitting: Internal Medicine

## 2021-01-06 ENCOUNTER — Ambulatory Visit: Payer: 59 | Admitting: Internal Medicine

## 2021-01-06 VITALS — BP 140/86 | HR 90 | Ht 59.0 in | Wt 198.2 lb

## 2021-01-06 DIAGNOSIS — Z794 Long term (current) use of insulin: Secondary | ICD-10-CM | POA: Diagnosis not present

## 2021-01-06 DIAGNOSIS — G5601 Carpal tunnel syndrome, right upper limb: Secondary | ICD-10-CM

## 2021-01-06 DIAGNOSIS — J40 Bronchitis, not specified as acute or chronic: Secondary | ICD-10-CM

## 2021-01-06 DIAGNOSIS — Z6836 Body mass index (BMI) 36.0-36.9, adult: Secondary | ICD-10-CM | POA: Diagnosis not present

## 2021-01-06 DIAGNOSIS — E119 Type 2 diabetes mellitus without complications: Secondary | ICD-10-CM | POA: Diagnosis not present

## 2021-01-06 DIAGNOSIS — E6609 Other obesity due to excess calories: Secondary | ICD-10-CM

## 2021-01-06 DIAGNOSIS — I10 Essential (primary) hypertension: Secondary | ICD-10-CM

## 2021-01-06 MED ORDER — AZITHROMYCIN 250 MG PO TABS
ORAL_TABLET | ORAL | 0 refills | Status: DC
Start: 2021-01-06 — End: 2021-08-04
  Filled 2021-01-06: qty 6, 5d supply, fill #0

## 2021-01-06 MED ORDER — INSULIN DEGLUDEC 100 UNIT/ML ~~LOC~~ SOPN
40.0000 [IU] | PEN_INJECTOR | Freq: Every day | SUBCUTANEOUS | 6 refills | Status: DC
  Filled 2021-01-06: qty 3, 37d supply, fill #0
  Filled 2021-07-06: qty 3, 37d supply, fill #1

## 2021-01-06 NOTE — Assessment & Plan Note (Signed)
Refer to orthopedics 

## 2021-01-06 NOTE — Progress Notes (Signed)
z-pak 

## 2021-01-06 NOTE — Assessment & Plan Note (Signed)

## 2021-01-06 NOTE — Progress Notes (Signed)
Established Patient Office Visit  Subjective:  Patient ID: Tamara Wells, female    DOB: [birth year]  Age: 60 y.o. MRN: [id number]  CC:  Chief Complaint  Patient presents with  . Cough    Pt here for coughing and congestion    HPI  Tamara Wells presents for upper respiratory infection, he also has allergic bronchitis and nose is stuffy.  There is no history of Covid infection there is no history of headache he denies any chest pain or shortness of breath on exertion denies any history of swelling of the legs.  She has been wheezing some every other day.  Past Medical History:  Diagnosis Date  . Diabetes mellitus without complication ([place])    Dx 2006  . Hyperlipidemia   . Hypertension   . Sleep apnea   . Vertigo    2002    Past Surgical History:  Procedure Laterality Date  . ABDOMINAL HYSTERECTOMY     2000  . CARPAL TUNNEL RELEASE Left   . CARPAL TUNNEL RELEASE Right 05/28/2019   Procedure: CARPAL TUNNEL RELEASE;  Surgeon: Earnestine Leys, MD;  Location: [facility] ORS;  Service: Orthopedics;  Laterality: Right;  . COLONOSCOPY WITH PROPOFOL N/A 03/21/2020   Procedure: COLONOSCOPY WITH PROPOFOL;  Surgeon: Virgel Manifold, MD;  Location: [facility] ENDOSCOPY;  Service: Endoscopy;  Laterality: N/A;  . TONSILLECTOMY      Family History  Problem Relation Age of Onset  . Diabetes Mother   . Diabetes Father   . Diabetes Maternal Grandmother   . Breast cancer Neg Hx     Social History   Socioeconomic History  . Marital status: Married    Spouse name: Not on file  . Number of children: Not on file  . Years of education: Not on file  . Highest education level: Not on file  Occupational History  . Not on file  Tobacco Use  . Smoking status: Former Smoker    Types: Cigarettes    Quit date: 09/27/2004    Years since quitting: 16.2  . Smokeless tobacco: Never Used  Vaping Use  . Vaping Use: Never used  Substance and Sexual Activity  . Alcohol use: No  . Drug use: No   . Sexual activity: Not on file  Other Topics Concern  . Not on file  Social History Narrative  . Not on file   Social Determinants of Health   Financial Resource Strain: Not on file  Food Insecurity: Not on file  Transportation Needs: Not on file  Physical Activity: Not on file  Stress: Not on file  Social Connections: Not on file  Intimate Partner Violence: Not on file     Current Outpatient Medications:  .  azithromycin (ZITHROMAX) 250 MG tablet, Take 2 tablets by mouth daily on day 1 then 1 tablet daily on days 2-5., Disp: 6 tablet, Rfl: 0 .  insulin degludec (TRESIBA) 100 UNIT/ML FlexTouch Pen, Inject 40 Units into the skin daily., Disp: 3 mL, Rfl: 6 .  acetaminophen (TYLENOL) 500 MG tablet, Take 1,000 mg by mouth every 6 (six) hours as needed for moderate pain or headache. , Disp: , Rfl:  .  amLODipine (NORVASC) 5 MG tablet, Take 5 mg by mouth daily., Disp: , Rfl:  .  amLODipine (NORVASC) 5 MG tablet, TAKE 1 TABLET BY MOUTH DAILY, Disp: 90 tablet, Rfl: 3 .  aspirin EC 81 MG tablet, Take 81 mg by mouth daily., Disp: , Rfl:  .  Black Cohosh 40  MG CAPS, Take 40 mg by mouth 2 (two) times daily., Disp: , Rfl:  .  diphenhydrAMINE (BENADRYL) 25 MG tablet, Take 25 mg by mouth daily as needed for allergies., Disp: , Rfl:  .  fluticasone (FLONASE) 50 MCG/ACT nasal spray, Place 1 spray into both nostrils at bedtime as needed for allergies or rhinitis., Disp: , Rfl:  .  gabapentin (NEURONTIN) 400 MG capsule, Take 1 capsule (400 mg total) by mouth 3 (three) times daily. (Patient not taking: Reported on 02/21/2020), Disp: 60 capsule, Rfl: 3 .  glipiZIDE (GLUCOTROL) 10 MG tablet, TAKE 1 TABLET BY MOUTH TWICE DAILY, Disp: 180 tablet, Rfl: 5 .  HYDROcodone-acetaminophen (NORCO) 5-325 MG tablet, Take 1-2 tablets by mouth every 6 (six) hours as needed. (Patient not taking: Reported on 02/21/2020), Disp: 50 tablet, Rfl: 0 .  Insulin Glargine (BASAGLAR KWIKPEN) 100 UNIT/ML, INJECT 40 UNITS INTO THE  SKIN DAILY., Disp: 15 mL, Rfl: 6 .  latanoprost (XALATAN) 0.005 % ophthalmic solution, Place 1 drop into both eyes at bedtime. , Disp: , Rfl: 11 .  latanoprost (XALATAN) 0.005 % ophthalmic solution, PLACE 1 DROP IN BOTH EYES AT BEDTIME, Disp: 2.5 mL, Rfl: 6 .  lisinopril (ZESTRIL) 20 MG tablet, TAKE 2 TABLETS BY MOUTH DAILY, Disp: 180 tablet, Rfl: 4 .  meclizine (ANTIVERT) 25 MG tablet, Take 25 mg by mouth 3 (three) times daily as needed for dizziness. , Disp: , Rfl:  .  meloxicam (MOBIC) 15 MG tablet, Take 15 mg by mouth daily. (Patient not taking: Reported on 03/06/2020), Disp: , Rfl:  .  meloxicam (MOBIC) 15 MG tablet, Take 1 tablet (15 mg total) by mouth daily. (Patient not taking: Reported on 03/06/2020), Disp: 30 tablet, Rfl: 3 .  metFORMIN (GLUCOPHAGE) 1000 MG tablet, Take 1,000 mg by mouth 2 (two) times daily with a meal., Disp: , Rfl:  .  metFORMIN (GLUCOPHAGE) 1000 MG tablet, TAKE 1 TABLET BY MOUTH TWICE DAILY, Disp: 180 tablet, Rfl: 2 .  Multiple Vitamins-Minerals (CENTRAL-VITE PO), Take 1 tablet by mouth daily., Disp: , Rfl:    Allergies  Allergen Reactions  . No Known Allergies     ROS Review of Systems  Constitutional: Negative.  Negative for chills and fatigue.  HENT: Positive for congestion, postnasal drip, rhinorrhea and sinus pain.   Eyes: Negative.   Respiratory: Positive for chest tightness.   Cardiovascular: Negative.   Gastrointestinal: Negative.   Endocrine: Negative.   Genitourinary: Negative.   Musculoskeletal: Negative.   Skin: Negative.   Allergic/Immunologic: Negative.   Neurological: Negative.   Hematological: Negative.   Psychiatric/Behavioral: Negative.   All other systems reviewed and are negative.     Objective:    Physical Exam  BP 140/86   Pulse 90   Ht 4\' 11"  (1.499 m)   Wt 198 lb 3.2 oz (89.9 kg)   BMI 40.03 kg/m  Wt Readings from Last 3 Encounters:  01/06/21 198 lb 3.2 oz (89.9 kg)  04/01/20 195 lb 11.2 oz (88.8 kg)  03/21/20 195 lb  (88.5 kg)     Health Maintenance Due  Topic Date Due  . Hepatitis C Screening  Never done  . PNEUMOCOCCAL POLYSACCHARIDE VACCINE AGE 25-64 HIGH RISK  Never done  . COVID-19 Vaccine (1) Never done  . FOOT EXAM  Never done  . HIV Screening  Never done  . TETANUS/TDAP  Never done  . PAP SMEAR-Modifier  Never done  . HEMOGLOBIN A1C  08/23/2020    There are no preventive care  reminders to display for this patient.  Lab Results  Component Value Date   TSH 1.24 02/21/2020   Lab Results  Component Value Date   WBC 7.7 02/21/2020   HGB 11.7 02/21/2020   HCT 36.3 02/21/2020   MCV 87.7 02/21/2020   PLT 370 02/21/2020   Lab Results  Component Value Date   NA 139 02/21/2020   K 4.4 02/21/2020   CO2 23 02/21/2020   GLUCOSE 74 02/21/2020   BUN 13 02/21/2020   CREATININE 0.64 02/21/2020   BILITOT 0.2 02/21/2020   ALKPHOS 79 09/17/2018   AST 17 02/21/2020   ALT 17 02/21/2020   PROT 6.6 02/21/2020   ALBUMIN 4.3 09/17/2018   CALCIUM 9.2 02/21/2020   ANIONGAP 10 09/17/2018   No results found for: CHOL No results found for: HDL No results found for: LDLCALC No results found for: TRIG No results found for: CHOLHDL Lab Results  Component Value Date   HGBA1C 6.3 (H) 02/21/2020      Assessment & Plan:   Problem List Items Addressed This Visit      Cardiovascular and Mediastinum   Hypertension    Patient blood pressure is normal patient denies any chest pain or shortness of breath there is no history of palpitation paroxysmal nocturnal dyspnea patient can walk  25yards without any problem patient was advised to follow low-salt low-cholesterol diet  I reviewed the results of Sprint trial  ideally I want to keep systolic blood pressure below 130 mmHg, patient was asked to check blood pressure 3 times a week and give me a report on that.  Patient will be follow-up in 3 months, patient will call me back for any change in the cardiovascular symptoms           Endocrine   Type  2 diabetes mellitus without complication, with long-term current use of insulin ([place])    - The patient's blood sugar is under control on med. - The patient will continue the current treatment regimen.  - I encouraged the patient to regularly check blood sugar.  - I encouraged the patient to monitor diet. I encouraged the patient to eat low-carb and low-sugar to help prevent blood sugar spikes.  - I encouraged the patient to continue following their prescribed treatment plan for diabetes - I informed the patient to get help if blood sugar drops below 54mg /dL, or if suddenly have trouble thinking clearly or breathing.       Relevant Medications   insulin degludec (TRESIBA) 100 UNIT/ML FlexTouch Pen     Nervous and Auditory   Carpal tunnel syndrome of right wrist    Refer to orthopedics.        Other   Class 2 obesity due to excess calories without serious comorbidity with body mass index (BMI) of 36.0 to 36.9 in adult    - I encouraged the patient to lose weight.  - I educated them on making healthy dietary choices including eating more fruits and vegetables and less fried foods. - I encouraged the patient to exercise more, and educated on the benefits of exercise including weight loss, diabetes prevention, and hypertension prevention.   Dietary counseling with a registered dietician  Referral to a weight management support group (e.g. Weight Watchers, Overeaters Anonymous)  If your BMI is greater than 29 or you have gained more than 15 pounds you should work on weight loss.  Attend a healthy cooking class       Relevant Medications   insulin  degludec (TRESIBA) 100 UNIT/ML FlexTouch Pen    Other Visit Diagnoses    Bronchitis    -  Primary   Relevant Medications   azithromycin (ZITHROMAX) 250 MG tablet   insulin degludec (TRESIBA) 100 UNIT/ML FlexTouch Pen   Type 2 diabetes mellitus without complication, without long-term current use of insulin (HCC)       Relevant Medications    insulin degludec (TRESIBA) 100 UNIT/ML FlexTouch Pen      Meds ordered this encounter  Medications  . azithromycin (ZITHROMAX) 250 MG tablet    Sig: Take 2 tablets by mouth daily on day 1 then 1 tablet daily on days 2-5.    Dispense:  6 tablet    Refill:  0  . insulin degludec (TRESIBA) 100 UNIT/ML FlexTouch Pen    Sig: Inject 40 Units into the skin daily.    Dispense:  3 mL    Refill:  6    Follow-up: No follow-ups on file.    Cletis Athens, MD

## 2021-01-06 NOTE — Assessment & Plan Note (Signed)

## 2021-01-06 NOTE — Assessment & Plan Note (Signed)

## 2021-01-19 ENCOUNTER — Other Ambulatory Visit: Payer: Self-pay

## 2021-01-19 MED FILL — Lisinopril Tab 20 MG: ORAL | 90 days supply | Qty: 180 | Fill #0 | Status: AC

## 2021-02-02 ENCOUNTER — Other Ambulatory Visit: Payer: Self-pay

## 2021-02-02 MED FILL — Latanoprost Ophth Soln 0.005%: OPHTHALMIC | 18 days supply | Qty: 2.5 | Fill #0 | Status: AC

## 2021-02-02 MED FILL — Glipizide Tab 10 MG: ORAL | 90 days supply | Qty: 180 | Fill #0 | Status: AC

## 2021-02-17 DIAGNOSIS — E113211 Type 2 diabetes mellitus with mild nonproliferative diabetic retinopathy with macular edema, right eye: Secondary | ICD-10-CM | POA: Diagnosis not present

## 2021-02-17 DIAGNOSIS — H43823 Vitreomacular adhesion, bilateral: Secondary | ICD-10-CM | POA: Diagnosis not present

## 2021-02-17 DIAGNOSIS — E113292 Type 2 diabetes mellitus with mild nonproliferative diabetic retinopathy without macular edema, left eye: Secondary | ICD-10-CM | POA: Diagnosis not present

## 2021-02-17 DIAGNOSIS — H35033 Hypertensive retinopathy, bilateral: Secondary | ICD-10-CM | POA: Diagnosis not present

## 2021-03-24 ENCOUNTER — Other Ambulatory Visit (HOSPITAL_COMMUNITY): Payer: Self-pay

## 2021-04-06 ENCOUNTER — Other Ambulatory Visit: Payer: Self-pay

## 2021-04-06 MED FILL — Amlodipine Besylate Tab 5 MG (Base Equivalent): ORAL | 90 days supply | Qty: 90 | Fill #0 | Status: AC

## 2021-04-06 MED FILL — Lisinopril Tab 20 MG: ORAL | 90 days supply | Qty: 180 | Fill #1 | Status: AC

## 2021-05-05 ENCOUNTER — Other Ambulatory Visit: Payer: Self-pay

## 2021-05-05 MED FILL — Metformin HCl Tab 1000 MG: ORAL | 90 days supply | Qty: 180 | Fill #0 | Status: AC

## 2021-05-05 MED FILL — Glipizide Tab 10 MG: ORAL | 90 days supply | Qty: 180 | Fill #1 | Status: AC

## 2021-05-06 ENCOUNTER — Other Ambulatory Visit: Payer: Self-pay

## 2021-05-06 DIAGNOSIS — E113393 Type 2 diabetes mellitus with moderate nonproliferative diabetic retinopathy without macular edema, bilateral: Secondary | ICD-10-CM | POA: Diagnosis not present

## 2021-05-06 MED ORDER — LATANOPROST 0.005 % OP SOLN
OPHTHALMIC | 1 refills | Status: DC
  Filled 2021-05-06 – 2022-03-11 (×2): qty 5, 30d supply, fill #0

## 2021-05-18 ENCOUNTER — Other Ambulatory Visit: Payer: Self-pay

## 2021-06-13 IMAGING — MG DIGITAL SCREENING BILAT W/ TOMO W/ CAD
6 of 12 series · 6 of 36 positions shown · non-contrast
Comparison: Previous exam(s).

CLINICAL DATA: Screening.

EXAM:
DIGITAL SCREENING BILATERAL MAMMOGRAM WITH TOMO AND CAD

[L CC synth-2D (1 of 2)]
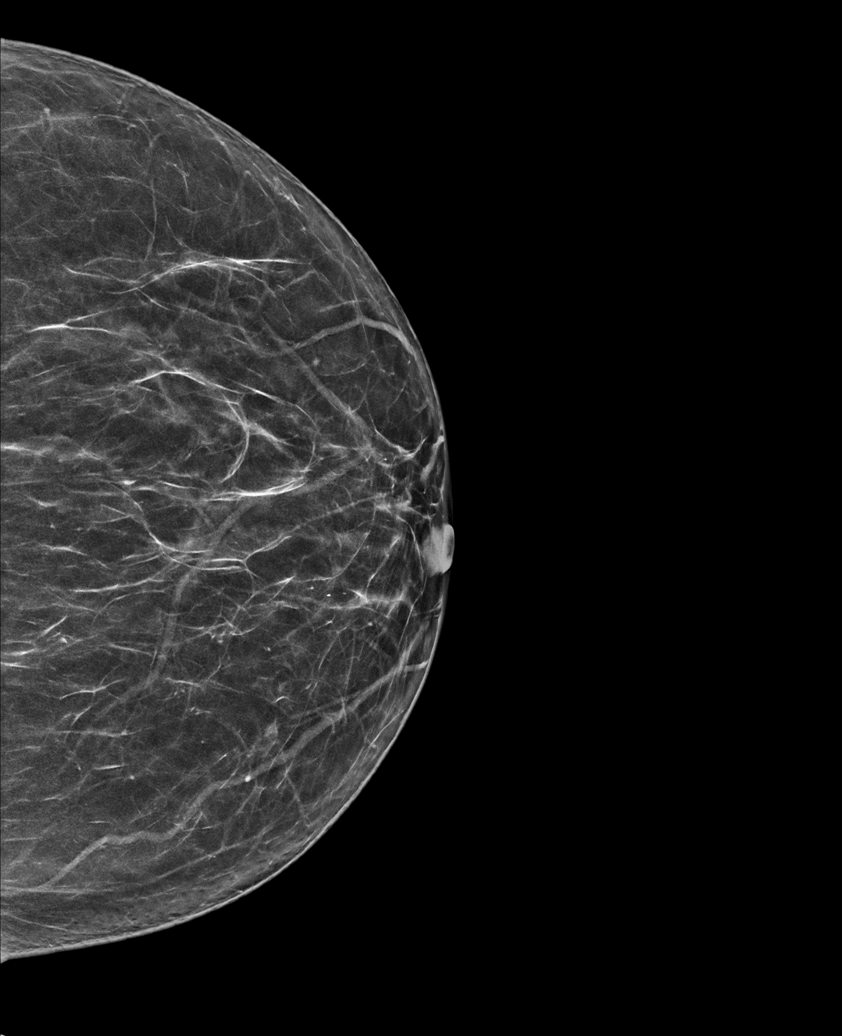

[R MLO synth-2D]
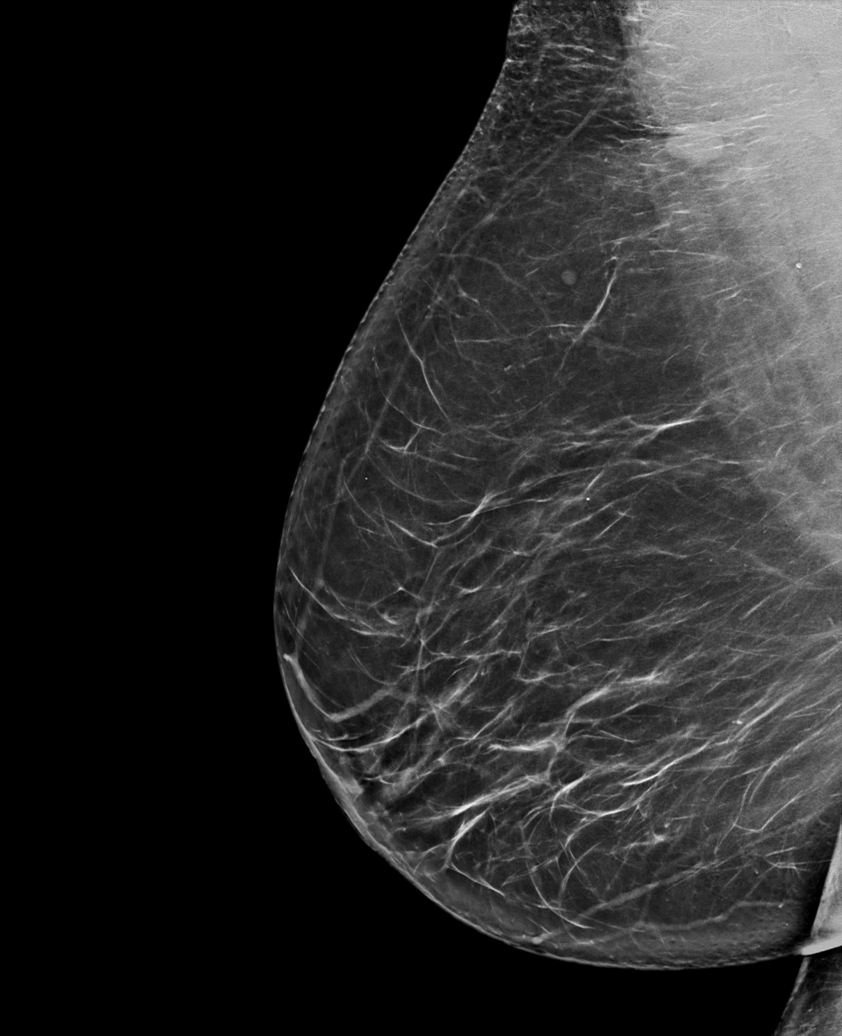

[L MLO synth-2D]
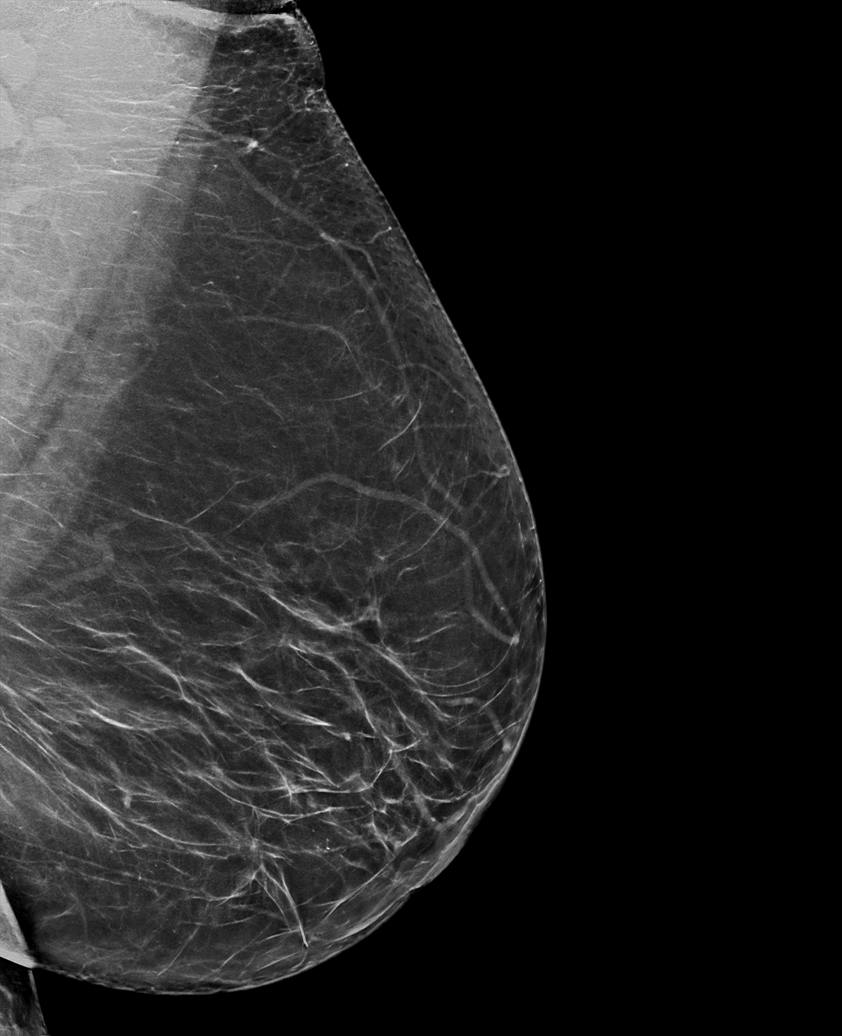

[L CC synth-2D (2 of 2)]
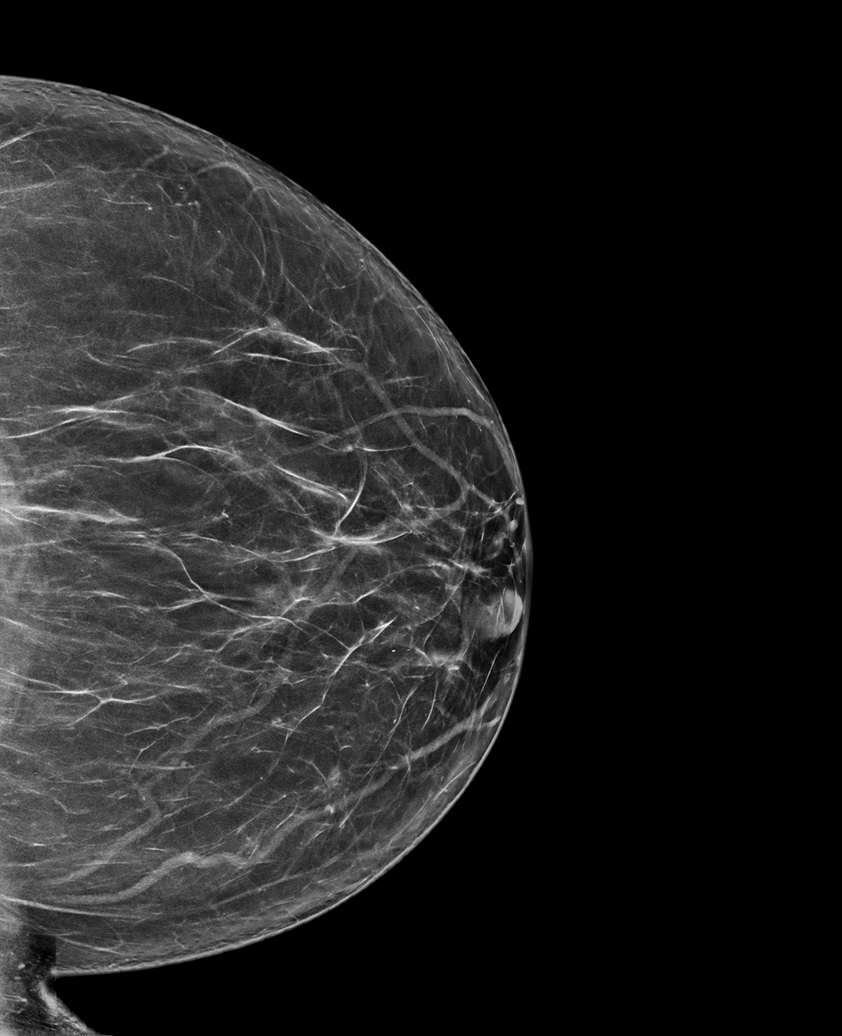

[R CC synth-2D (1 of 2)]
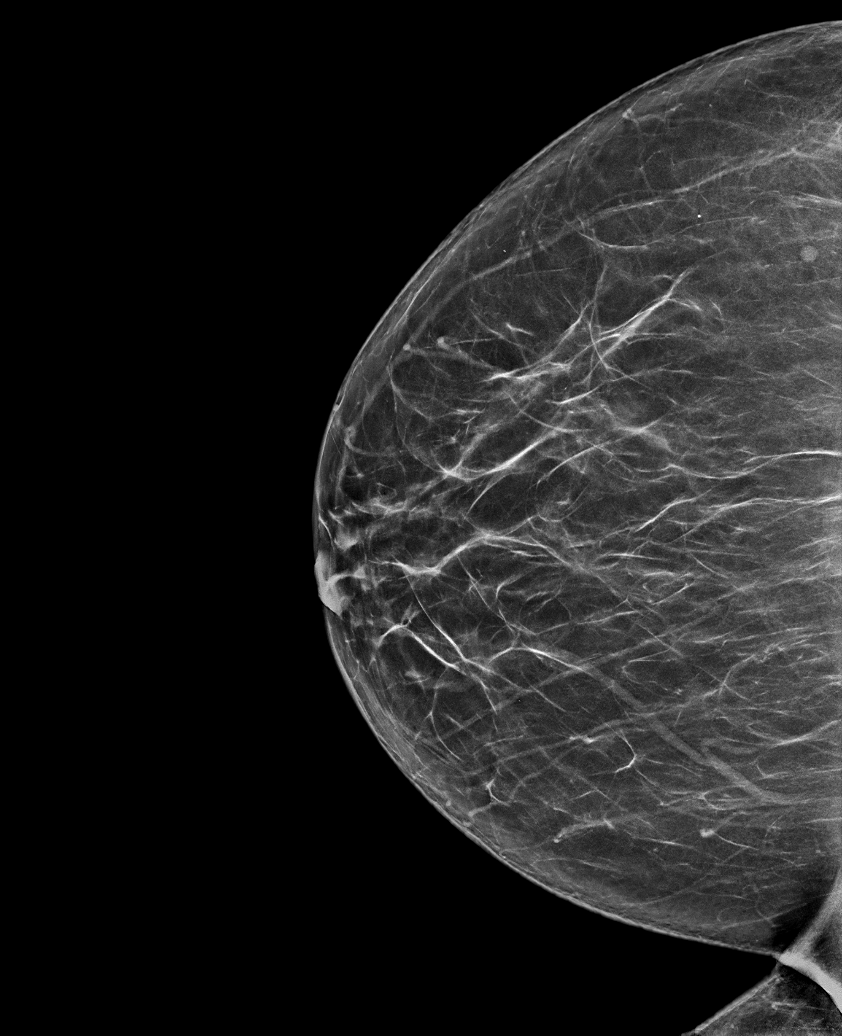

[R CC synth-2D (2 of 2)]
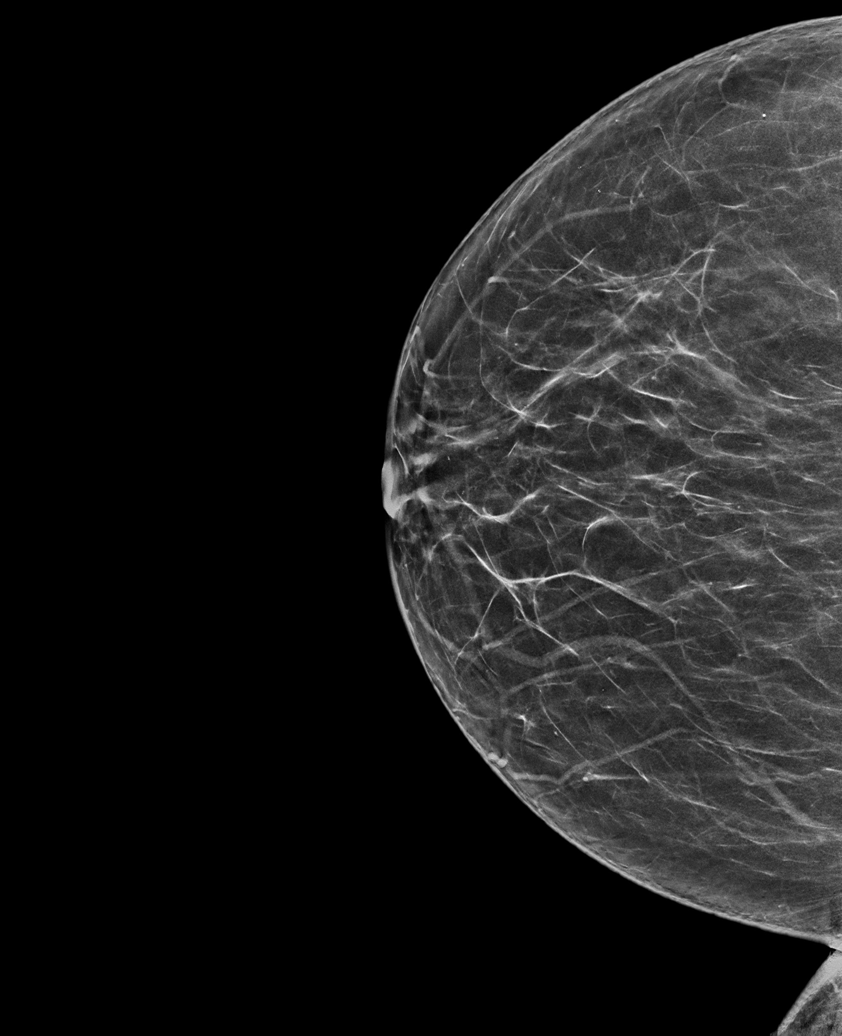

[6 of 36 positions shown; findings below may reference images not displayed]

ACR Breast Density Category b: There are scattered areas of
fibroglandular density.
FINDINGS: There are no findings suspicious for malignancy. Images were
processed with CAD.
IMPRESSION: No mammographic evidence of malignancy. A result letter of this
screening mammogram will be mailed directly to the patient.

RECOMMENDATION:
Screening mammogram in one year. (Code:[id number])

BI-RADS CATEGORY  1: Negative.

## 2021-06-30 LAB — HM DIABETES EYE EXAM

## 2021-07-06 MED FILL — Amlodipine Besylate Tab 5 MG (Base Equivalent): ORAL | 90 days supply | Qty: 90 | Fill #1 | Status: AC

## 2021-07-06 MED FILL — Lisinopril Tab 20 MG: ORAL | 90 days supply | Qty: 180 | Fill #2 | Status: AC

## 2021-07-07 ENCOUNTER — Other Ambulatory Visit: Payer: Self-pay

## 2021-07-07 DIAGNOSIS — E113212 Type 2 diabetes mellitus with mild nonproliferative diabetic retinopathy with macular edema, left eye: Secondary | ICD-10-CM | POA: Diagnosis not present

## 2021-07-07 DIAGNOSIS — H43823 Vitreomacular adhesion, bilateral: Secondary | ICD-10-CM | POA: Diagnosis not present

## 2021-07-07 DIAGNOSIS — E113311 Type 2 diabetes mellitus with moderate nonproliferative diabetic retinopathy with macular edema, right eye: Secondary | ICD-10-CM | POA: Diagnosis not present

## 2021-07-07 DIAGNOSIS — H35033 Hypertensive retinopathy, bilateral: Secondary | ICD-10-CM | POA: Diagnosis not present

## 2021-07-07 MED ORDER — INSULIN DEGLUDEC 100 UNIT/ML ~~LOC~~ SOPN
PEN_INJECTOR | SUBCUTANEOUS | 5 refills | Status: DC
Start: 2021-01-06 — End: 2022-01-22
  Filled 2021-07-07: qty 15, 37d supply, fill #0
  Filled 2021-09-24: qty 15, 37d supply, fill #1
  Filled 2021-11-02 – 2021-11-03 (×2): qty 15, 37d supply, fill #2
  Filled 2021-12-14: qty 15, 37d supply, fill #3

## 2021-07-07 MED FILL — Latanoprost Ophth Soln 0.005%: OPHTHALMIC | 18 days supply | Qty: 2.5 | Fill #1 | Status: AC

## 2021-08-03 ENCOUNTER — Other Ambulatory Visit: Payer: Self-pay

## 2021-08-03 MED FILL — Glipizide Tab 10 MG: ORAL | 90 days supply | Qty: 180 | Fill #2 | Status: AC

## 2021-08-04 ENCOUNTER — Other Ambulatory Visit: Payer: Self-pay

## 2021-08-04 ENCOUNTER — Encounter: Payer: Self-pay | Admitting: Internal Medicine

## 2021-08-04 ENCOUNTER — Ambulatory Visit: Payer: 59 | Admitting: Internal Medicine

## 2021-08-04 VITALS — BP 140/100 | HR 100 | Ht 59.0 in | Wt 211.8 lb

## 2021-08-04 DIAGNOSIS — E6609 Other obesity due to excess calories: Secondary | ICD-10-CM | POA: Diagnosis not present

## 2021-08-04 DIAGNOSIS — Z0000 Encounter for general adult medical examination without abnormal findings: Secondary | ICD-10-CM

## 2021-08-04 DIAGNOSIS — Z1211 Encounter for screening for malignant neoplasm of colon: Secondary | ICD-10-CM

## 2021-08-04 DIAGNOSIS — E119 Type 2 diabetes mellitus without complications: Secondary | ICD-10-CM

## 2021-08-04 DIAGNOSIS — G5601 Carpal tunnel syndrome, right upper limb: Secondary | ICD-10-CM | POA: Diagnosis not present

## 2021-08-04 DIAGNOSIS — I10 Essential (primary) hypertension: Secondary | ICD-10-CM | POA: Diagnosis not present

## 2021-08-04 DIAGNOSIS — Z794 Long term (current) use of insulin: Secondary | ICD-10-CM | POA: Diagnosis not present

## 2021-08-04 DIAGNOSIS — Z6836 Body mass index (BMI) 36.0-36.9, adult: Secondary | ICD-10-CM

## 2021-08-04 DIAGNOSIS — Z124 Encounter for screening for malignant neoplasm of cervix: Secondary | ICD-10-CM

## 2021-08-04 MED ORDER — LISINOPRIL 40 MG PO TABS
40.0000 mg | ORAL_TABLET | Freq: Every day | ORAL | 3 refills | Status: DC
  Filled 2021-08-04: qty 90, 90d supply, fill #0
  Filled 2021-11-25: qty 90, 90d supply, fill #1
  Filled 2022-03-11: qty 90, 90d supply, fill #2
  Filled 2022-06-23: qty 90, 90d supply, fill #3

## 2021-08-04 NOTE — Progress Notes (Signed)
Established Patient Office Visit  Subjective:  Patient ID: Tamara Wells, female    DOB: 04-Oct-1960  Age: 60 y.o. MRN: [id number]  CC:  Chief Complaint  Patient presents with   Diabetes    General check up    Diabetes   Tamara Wells presents for bp check, she is known to have diabetes hyperlipidemia sleep apnea and vertigo.   Past Surgical History:  Procedure Laterality Date   ABDOMINAL HYSTERECTOMY     2000   CARPAL TUNNEL RELEASE Left    CARPAL TUNNEL RELEASE Right 05/28/2019   Procedure: CARPAL TUNNEL RELEASE;  Surgeon: Earnestine Leys, MD;  Location: [facility] ORS;  Service: Orthopedics;  Laterality: Right;   COLONOSCOPY WITH PROPOFOL N/A 03/21/2020   Procedure: COLONOSCOPY WITH PROPOFOL;  Surgeon: Virgel Manifold, MD;  Location: [facility];  Service: Endoscopy;  Laterality: N/A;   TONSILLECTOMY      Family History  Problem Relation Age of Onset   Diabetes Mother    Diabetes Father    Diabetes Maternal Grandmother    Breast cancer Neg Hx     Social History   Socioeconomic History   Marital status: Married    Spouse name: Not on file   Number of children: Not on file   Years of education: Not on file   Highest education level: Not on file  Occupational History   Not on file  Tobacco Use   Smoking status: Former    Types: Cigarettes    Quit date: 09/27/2004    Years since quitting: 16.8   Smokeless tobacco: Never  Vaping Use   Vaping Use: Never used  Substance and Sexual Activity   Alcohol use: No   Drug use: No   Sexual activity: Not on file  Other Topics Concern   Not on file  Social History Narrative   Not on file   Social Determinants of Health   Financial Resource Strain: Not on file  Food Insecurity: Not on file  Transportation Needs: Not on file  Physical Activity: Not on file  Stress: Not on file  Social Connections: Not on file  Intimate Partner Violence: Not on file     Current Outpatient Medications:    acetaminophen  (TYLENOL) 500 MG tablet, Take 1,000 mg by mouth every 6 (six) hours as needed for moderate pain or headache. , Disp: , Rfl:    amLODipine (NORVASC) 5 MG tablet, Take 5 mg by mouth daily., Disp: , Rfl:    aspirin EC 81 MG tablet, Take 81 mg by mouth daily., Disp: , Rfl:    Black Cohosh 40 MG CAPS, Take 40 mg by mouth 2 (two) times daily., Disp: , Rfl:    diphenhydrAMINE (BENADRYL) 25 MG tablet, Take 25 mg by mouth daily as needed for allergies., Disp: , Rfl:    fluticasone (FLONASE) 50 MCG/ACT nasal spray, Place 1 spray into both nostrils at bedtime as needed for allergies or rhinitis., Disp: , Rfl:    glipiZIDE (GLUCOTROL) 10 MG tablet, TAKE 1 TABLET BY MOUTH TWICE DAILY, Disp: 180 tablet, Rfl: 5   insulin degludec (TRESIBA) 100 UNIT/ML FlexTouch Pen, Inject 40 units into the skin daily, Disp: 15 mL, Rfl: 5   latanoprost (XALATAN) 0.005 % ophthalmic solution, instill 1 drop into affected eye(s) by ophthalmic route once daily in the evening, Disp: 5 mL, Rfl: 1   lisinopril (ZESTRIL) 20 MG tablet, TAKE 2 TABLETS BY MOUTH DAILY, Disp: 180 tablet, Rfl: 4   lisinopril (ZESTRIL) 40  MG tablet, Take 1 tablet (40 mg total) by mouth daily., Disp: 90 tablet, Rfl: 3   meclizine (ANTIVERT) 25 MG tablet, Take 25 mg by mouth 3 (three) times daily as needed for dizziness. , Disp: , Rfl:    metFORMIN (GLUCOPHAGE) 1000 MG tablet, TAKE 1 TABLET BY MOUTH TWICE DAILY, Disp: 180 tablet, Rfl: 2   Multiple Vitamins-Minerals (CENTRAL-VITE PO), Take 1 tablet by mouth daily., Disp: , Rfl:    Insulin Glargine (BASAGLAR KWIKPEN) 100 UNIT/ML, INJECT 40 UNITS INTO THE SKIN DAILY., Disp: 15 mL, Rfl: 6   Allergies  Allergen Reactions   No Known Allergies     ROS Review of Systems  Constitutional: Negative.   HENT: Negative.    Eyes: Negative.   Respiratory: Negative.    Cardiovascular: Negative.   Gastrointestinal: Negative.   Endocrine: Negative.   Genitourinary: Negative.   Musculoskeletal: Negative.   Skin:  Negative.   Allergic/Immunologic: Negative.   Neurological: Negative.   Hematological: Negative.   Psychiatric/Behavioral: Negative.    All other systems reviewed and are negative.    Objective:    Physical Exam Vitals reviewed.  Constitutional:      Appearance: Normal appearance.  HENT:     Mouth/Throat:     Mouth: Mucous membranes are moist.  Eyes:     Pupils: Pupils are equal, round, and reactive to light.  Neck:     Vascular: No carotid bruit.  Cardiovascular:     Rate and Rhythm: Normal rate and regular rhythm.     Pulses: Normal pulses.     Heart sounds: Normal heart sounds.  Pulmonary:     Effort: Pulmonary effort is normal.     Breath sounds: Normal breath sounds.  Abdominal:     General: Bowel sounds are normal.     Palpations: Abdomen is soft. There is no hepatomegaly, splenomegaly or mass.     Tenderness: There is no abdominal tenderness.     Hernia: No hernia is present.  Musculoskeletal:        General: No tenderness.     Cervical back: Neck supple.     Right lower leg: No edema.     Left lower leg: No edema.  Skin:    Findings: No rash.  Neurological:     Mental Status: She is alert and oriented to person, place, and time.     Motor: No weakness.  Psychiatric:        Mood and Affect: Mood and affect normal.        Behavior: Behavior normal.    BP (!) 140/100   Pulse 100   Ht _0  (1.499 m)   Wt 211 lb 12.8 oz (96.1 kg)   BMI 42.78 kg/m  Wt Readings from Last 3 Encounters:  08/04/21 211 lb 12.8 oz (96.1 kg)  01/06/21 198 lb 3.2 oz (89.9 kg)  04/01/20 195 lb 11.2 oz (88.8 kg)     Health Maintenance Due  Topic Date Due   COVID-19 Vaccine (1) Never done   Pneumococcal Vaccine 82-19 Years old (1 - PCV) Never done   FOOT EXAM  Never done   TETANUS/TDAP  Never done   PAP SMEAR-Modifier  Never done   Zoster Vaccines- Shingrix (1 of 2) Never done   COLONOSCOPY (Pts 45-25yr Insurance coverage will need to be confirmed)  03/21/2021     There are no preventive care reminders to display for this patient.  Lab Results  Component Value Date   TSH 2.89 08/04/2021  Lab Results  Component Value Date   WBC 7.1 08/04/2021   HGB 12.7 08/04/2021   HCT 38.0 08/04/2021   MCV 86.8 08/04/2021   PLT 352 08/04/2021   Lab Results  Component Value Date   NA 142 08/04/2021   K 4.1 08/04/2021   CO2 21 08/04/2021   GLUCOSE 128 (H) 08/04/2021   BUN 14 08/04/2021   CREATININE 0.62 08/04/2021   BILITOT 0.3 08/04/2021   ALKPHOS 79 09/17/2018   AST 15 08/04/2021   ALT 17 08/04/2021   PROT 7.1 08/04/2021   ALBUMIN 4.3 09/17/2018   CALCIUM 9.4 08/04/2021   ANIONGAP 10 09/17/2018   EGFR 103 08/04/2021   Lab Results  Component Value Date   CHOL 267 (H) 08/04/2021   Lab Results  Component Value Date   HDL 53 08/04/2021   Lab Results  Component Value Date   LDLCALC 174 (H) 08/04/2021   Lab Results  Component Value Date   TRIG 236 (H) 08/04/2021   Lab Results  Component Value Date   CHOLHDL 5.0 (H) 08/04/2021   Lab Results  Component Value Date   HGBA1C 7.7 (H) 08/04/2021   Problem List Items Addressed This Visit       Cardiovascular and Mediastinum   Hypertension    The following hypertensive lifestyle modification were recommended and discussed:  1. Limiting alcohol intake to less than 1 oz/day of ethanol:(24 oz of beer or 8 oz of wine or 2 oz of 100-proof whiskey). 2. Take baby ASA 81 mg daily. 3. Importance of regular aerobic exercise and losing weight. 4. Reduce dietary saturated fat and cholesterol intake for overall cardiovascular health. 5. Maintaining adequate dietary potassium, calcium, and magnesium intake. 6. Regular monitoring of the blood pressure. 7. Reduce sodium intake to less than 100 mmol/day (less than 2.3 gm of sodium or less than 6 gm of sodium choride)       Relevant Medications   lisinopril (ZESTRIL) 40 MG tablet     Endocrine   Type 2 diabetes mellitus without complication,  with long-term current use of insulin ([place])    - The patient's blood sugar is labile on med. - The patient will continue the current treatment regimen.  - I encouraged the patient to regularly check blood sugar.  - I encouraged the patient to monitor diet. I encouraged the patient to eat low-carb and low-sugar to help prevent blood sugar spikes.  - I encouraged the patient to continue following their prescribed treatment plan for diabetes - I informed the patient to get help if blood sugar drops below 28m/dL, or if suddenly have trouble thinking clearly or breathing.  Patient was advised to buy a book on diabetes from a local bookstore or from [place]  Patient should read 2 chapters every day to keep the motivation going, this is in addition to some of the materials we provided them from the office.  There are other resources on the Internet like YouTube and wilkipedia to get an education on the diabetes      Relevant Medications   lisinopril (ZESTRIL) 40 MG tablet     Nervous and Auditory   Carpal tunnel syndrome of right wrist    Refer to orthopedics        Other   Class 2 obesity due to excess calories without serious comorbidity with body mass index (BMI) of 36.0 to 36.9 in adult    - I encouraged the patient to lose weight.  - I educated them on  making healthy dietary choices including eating more fruits and vegetables and less fried foods. - I encouraged the patient to exercise more, and educated on the benefits of exercise including weight loss, diabetes prevention, and hypertension prevention.   Dietary counseling with a registered dietician  Referral to a weight management support group (e.g. Weight Watchers, Overeaters Anonymous)  If your BMI is greater than 29 or you have gained more than 15 pounds you should work on weight loss.  Attend a healthy cooking class       Annual physical exam    Patient was advised to lose weight walk on a daily basis follow low-cholesterol  diet. Hypercholesterolemia  I advised the patient to follow Mediterranean diet This diet is rich in fruits vegetables and whole grain, and This diet is also rich in fish and lean meat Patient should also eat a handful of almonds or walnuts daily Recent heart study indicated that average follow-up on this kind of diet reduces the cardiovascular mortality by 50 to 70%==      Relevant Orders   CBC with Differential/Platelet (Completed)   Hemoglobin A1c (Completed)   TSH (Completed)   COMPLETE METABOLIC PANEL WITH GFR (Completed)   Lipid panel (Completed)   Hepatitis C Antibody (Completed)   HIV antibody (with reflex) (Completed)   Other Visit Diagnoses     Type 2 diabetes mellitus without complication, without long-term current use of insulin ([place])    -  Primary   Relevant Medications   lisinopril (ZESTRIL) 40 MG tablet   Other Relevant Orders   POCT glucose (manual entry)   Screening for colon cancer       Relevant Orders   Ambulatory referral to Gastroenterology   Screening for cervical cancer       Relevant Orders   Ambulatory referral to Obstetrics / Gynecology            Meds ordered this encounter  Medications   lisinopril (ZESTRIL) 40 MG tablet    Sig: Take 1 tablet (40 mg total) by mouth daily.    Dispense:  90 tablet    Refill:  3     Follow-up: No follow-ups on file.    Cletis Athens, MD

## 2021-08-04 NOTE — Assessment & Plan Note (Signed)
Patient was advised to lose weight walk on a daily basis follow low-cholesterol diet. Hypercholesterolemia  I advised the patient to follow Mediterranean diet This diet is rich in fruits vegetables and whole grain, and This diet is also rich in fish and lean meat Patient should also eat a handful of almonds or walnuts daily Recent heart study indicated that average follow-up on this kind of diet reduces the cardiovascular mortality by 50 to 70%==

## 2021-08-04 NOTE — Assessment & Plan Note (Signed)
Refer to orthopedics 

## 2021-08-04 NOTE — Assessment & Plan Note (Signed)

## 2021-08-04 NOTE — Assessment & Plan Note (Signed)

## 2021-08-04 NOTE — Assessment & Plan Note (Signed)

## 2021-08-05 LAB — HEPATITIS C ANTIBODY
Hepatitis C Ab: NONREACTIVE
SIGNAL TO CUT-OFF: 0.05 (ref <1.00)

## 2021-08-05 LAB — CBC WITH DIFFERENTIAL/PLATELET
Absolute Monocytes: 483 cells/uL (ref 200–950)
Basophils Absolute: 43 cells/uL (ref 0–200)
Basophils Relative: 0.6 %
Eosinophils Absolute: 149 cells/uL (ref 15–500)
Eosinophils Relative: 2.1 %
HCT: 38 % (ref 35.0–45.0)
Hemoglobin: 12.7 g/dL (ref 11.7–15.5)
Lymphs Abs: 2783 cells/uL (ref 850–3900)
MCH: 29 pg (ref 27.0–33.0)
MCHC: 33.4 g/dL (ref 32.0–36.0)
MCV: 86.8 fL (ref 80.0–100.0)
MPV: 10 fL (ref 7.5–12.5)
Monocytes Relative: 6.8 %
Neutro Abs: 3642 cells/uL (ref 1500–7800)
Neutrophils Relative %: 51.3 %
Platelets: 352 10*3/uL (ref 140–400)
RBC: 4.38 10*6/uL (ref 3.80–5.10)
RDW: 14.3 % (ref 11.0–15.0)
Total Lymphocyte: 39.2 %
WBC: 7.1 10*3/uL (ref 3.8–10.8)

## 2021-08-05 LAB — COMPLETE METABOLIC PANEL WITH GFR
AG Ratio: 1.4 (calc) (ref 1.0–2.5)
ALT: 17 U/L (ref 6–29)
AST: 15 U/L (ref 10–35)
Albumin: 4.2 g/dL (ref 3.6–5.1)
Alkaline phosphatase (APISO): 84 U/L (ref 37–153)
BUN: 14 mg/dL (ref 7–25)
CO2: 21 mmol/L (ref 20–32)
Calcium: 9.4 mg/dL (ref 8.6–10.4)
Chloride: 105 mmol/L (ref 98–110)
Creat: 0.62 mg/dL (ref 0.50–1.03)
Globulin: 2.9 g/dL (calc) (ref 1.9–3.7)
Glucose, Bld: 128 mg/dL — ABNORMAL HIGH (ref 65–99)
Potassium: 4.1 mmol/L (ref 3.5–5.3)
Sodium: 142 mmol/L (ref 135–146)
Total Bilirubin: 0.3 mg/dL (ref 0.2–1.2)
Total Protein: 7.1 g/dL (ref 6.1–8.1)
eGFR: 103 mL/min/{1.73_m2} (ref >=60)

## 2021-08-05 LAB — HEMOGLOBIN A1C
Hgb A1c MFr Bld: 7.7 % of total Hgb — ABNORMAL HIGH (ref <5.7)
Mean Plasma Glucose: 174 mg/dL
eAG (mmol/L): 9.7 mmol/L

## 2021-08-05 LAB — LIPID PANEL
Cholesterol: 267 mg/dL — ABNORMAL HIGH (ref <200)
HDL: 53 mg/dL (ref >=50)
LDL Cholesterol (Calc): 174 mg/dL (calc) — ABNORMAL HIGH
Non-HDL Cholesterol (Calc): 214 mg/dL (calc) — ABNORMAL HIGH (ref <130)
Total CHOL/HDL Ratio: 5 (calc) — ABNORMAL HIGH (ref <5.0)
Triglycerides: 236 mg/dL — ABNORMAL HIGH (ref <150)

## 2021-08-05 LAB — HIV ANTIBODY (ROUTINE TESTING W REFLEX): HIV 1&2 Ab, 4th Generation: NONREACTIVE

## 2021-08-05 LAB — TSH: TSH: 2.89 mIU/L (ref 0.40–4.50)

## 2021-08-16 ENCOUNTER — Other Ambulatory Visit: Payer: Self-pay | Admitting: Internal Medicine

## 2021-08-16 MED ORDER — METFORMIN HCL 1000 MG PO TABS
ORAL_TABLET | Freq: Two times a day (BID) | ORAL | 2 refills | Status: DC
  Filled 2021-08-16: qty 180, 90d supply, fill #0
  Filled 2021-11-17: qty 180, 90d supply, fill #1
  Filled 2022-02-07: qty 180, 90d supply, fill #2

## 2021-08-17 ENCOUNTER — Other Ambulatory Visit: Payer: Self-pay

## 2021-09-17 ENCOUNTER — Encounter: Payer: Self-pay | Admitting: Obstetrics and Gynecology

## 2021-09-24 ENCOUNTER — Other Ambulatory Visit: Payer: Self-pay

## 2021-09-29 ENCOUNTER — Other Ambulatory Visit: Payer: Self-pay

## 2021-10-06 ENCOUNTER — Other Ambulatory Visit: Payer: Self-pay | Admitting: *Deleted

## 2021-10-06 ENCOUNTER — Other Ambulatory Visit: Payer: Self-pay

## 2021-10-06 MED ORDER — FUROSEMIDE 20 MG PO TABS
20.0000 mg | ORAL_TABLET | ORAL | 3 refills | Status: DC
  Filled 2021-10-06: qty 30, 60d supply, fill #0
  Filled 2021-11-25: qty 30, 60d supply, fill #1
  Filled 2022-01-27: qty 30, 60d supply, fill #2
  Filled 2022-04-07: qty 30, 30d supply, fill #3

## 2021-10-09 ENCOUNTER — Other Ambulatory Visit: Payer: Self-pay

## 2021-10-09 DIAGNOSIS — M25571 Pain in right ankle and joints of right foot: Secondary | ICD-10-CM | POA: Diagnosis not present

## 2021-10-09 DIAGNOSIS — M654 Radial styloid tenosynovitis [de Quervain]: Secondary | ICD-10-CM | POA: Diagnosis not present

## 2021-10-09 DIAGNOSIS — M25572 Pain in left ankle and joints of left foot: Secondary | ICD-10-CM | POA: Diagnosis not present

## 2021-10-09 MED ORDER — MELOXICAM 15 MG PO TABS
ORAL_TABLET | ORAL | 2 refills | Status: DC
  Filled 2021-10-09: qty 30, 30d supply, fill #0
  Filled 2021-11-02: qty 30, 30d supply, fill #1
  Filled 2022-01-08: qty 30, 30d supply, fill #2

## 2021-10-23 ENCOUNTER — Encounter: Payer: Self-pay | Admitting: Obstetrics and Gynecology

## 2021-11-02 MED FILL — Glipizide Tab 10 MG: ORAL | 90 days supply | Qty: 180 | Fill #3 | Status: AC

## 2021-11-03 ENCOUNTER — Other Ambulatory Visit: Payer: Self-pay

## 2021-11-04 ENCOUNTER — Encounter: Payer: Self-pay | Admitting: Internal Medicine

## 2021-11-04 ENCOUNTER — Ambulatory Visit: Payer: 59 | Admitting: Internal Medicine

## 2021-11-04 ENCOUNTER — Other Ambulatory Visit: Payer: Self-pay

## 2021-11-04 VITALS — BP 145/80 | HR 84 | Ht 59.0 in | Wt 216.8 lb

## 2021-11-04 DIAGNOSIS — E119 Type 2 diabetes mellitus without complications: Secondary | ICD-10-CM

## 2021-11-04 DIAGNOSIS — Z6836 Body mass index (BMI) 36.0-36.9, adult: Secondary | ICD-10-CM

## 2021-11-04 DIAGNOSIS — Z794 Long term (current) use of insulin: Secondary | ICD-10-CM

## 2021-11-04 DIAGNOSIS — E6609 Other obesity due to excess calories: Secondary | ICD-10-CM

## 2021-11-04 DIAGNOSIS — G5601 Carpal tunnel syndrome, right upper limb: Secondary | ICD-10-CM

## 2021-11-04 DIAGNOSIS — R21 Rash and other nonspecific skin eruption: Secondary | ICD-10-CM | POA: Diagnosis not present

## 2021-11-04 DIAGNOSIS — I10 Essential (primary) hypertension: Secondary | ICD-10-CM | POA: Diagnosis not present

## 2021-11-04 LAB — GLUCOSE, POCT (MANUAL RESULT ENTRY): POC Glucose: 137 mg/dl — AB (ref 70–99)

## 2021-11-04 MED ORDER — ROSUVASTATIN CALCIUM 10 MG PO TABS
10.0000 mg | ORAL_TABLET | Freq: Every day | ORAL | 3 refills | Status: DC
  Filled 2021-11-04: qty 90, 90d supply, fill #0
  Filled 2022-01-27: qty 90, 90d supply, fill #1
  Filled 2022-05-06: qty 90, 90d supply, fill #2
  Filled 2022-08-09: qty 90, 90d supply, fill #3

## 2021-11-04 MED ORDER — GABAPENTIN 100 MG PO CAPS
100.0000 mg | ORAL_CAPSULE | Freq: Three times a day (TID) | ORAL | 3 refills | Status: DC
  Filled 2021-11-04: qty 90, 30d supply, fill #0
  Filled 2021-12-09: qty 90, 30d supply, fill #1
  Filled 2022-04-16: qty 90, 30d supply, fill #2
  Filled 2022-06-26: qty 90, 30d supply, fill #3

## 2021-11-04 NOTE — Assessment & Plan Note (Signed)
Stable now. 

## 2021-11-04 NOTE — Progress Notes (Signed)
Established Patient Office Visit  Subjective:  Patient ID: Tamara Wells, female    DOB: [birth year]  Age: 61 y.o. MRN: [id number]  CC:  Chief Complaint  Patient presents with   Diabetes    Patient here today to follow up on her diabetes     Diabetes   Tamara Wells presents for check up  Past Medical History:  Diagnosis Date   Diabetes mellitus without complication ([place])    Dx 2006   Hyperlipidemia    Hypertension    Sleep apnea    Vertigo    2002    Past Surgical History:  Procedure Laterality Date   ABDOMINAL HYSTERECTOMY     2000   CARPAL TUNNEL RELEASE Left    CARPAL TUNNEL RELEASE Right 05/28/2019   Procedure: CARPAL TUNNEL RELEASE;  Surgeon: Earnestine Leys, MD;  Location: [facility] OR[facility];  Service: Orthopedics;  Laterality: Right;   COLONOSCOPY WITH PROPOFOL N/A 03/21/2020   Procedure: COLONOSCOPY WITH PROPOFOL;  Surgeon: Virgel Manifold, MD;  Location: [facility];  Service: Endoscopy;  Laterality: N/A;   TONSILLECTOMY      Family History  Problem Relation Age of Onset   Diabetes Mother    Diabetes Father    Diabetes Maternal Grandmother    Breast cancer Neg Hx     Social History   Socioeconomic History   Marital status: Married    Spouse name: Not on file   Number of children: Not on file   Years of education: Not on file   Highest education level: Not on file  Occupational History   Not on file  Tobacco Use   Smoking status: Former    Types: Cigarettes    Quit date: 09/27/2004    Years since quitting: 17.1   Smokeless tobacco: Never  Vaping Use   Vaping Use: Never used  Substance and Sexual Activity   Alcohol use: No   Drug use: No   Sexual activity: Not on file  Other Topics Concern   Not on file  Social History Narrative   Not on file   Social Determinants of Health   Financial Resource Strain: Not on file  Food Insecurity: Not on file  Transportation Needs: Not on file  Physical Activity: Not on file  Stress: Not on  file  Social Connections: Not on file  Intimate Partner Violence: Not on file     Current Outpatient Medications:    acetaminophen (TYLENOL) 500 MG tablet, Take 1,000 mg by mouth every 6 (six) hours as needed for moderate pain or headache. , Disp: , Rfl:    amLODipine (NORVASC) 5 MG tablet, Take 5 mg by mouth daily., Disp: , Rfl:    aspirin EC 81 MG tablet, Take 81 mg by mouth daily., Disp: , Rfl:    Black Cohosh 40 MG CAPS, Take 40 mg by mouth 2 (two) times daily., Disp: , Rfl:    diphenhydrAMINE (BENADRYL) 25 MG tablet, Take 25 mg by mouth daily as needed for allergies., Disp: , Rfl:    fluticasone (FLONASE) 50 MCG/ACT nasal spray, Place 1 spray into both nostrils at bedtime as needed for allergies or rhinitis., Disp: , Rfl:    furosemide (LASIX) 20 MG tablet, Take 1 tablet (20 mg total) by mouth every other day., Disp: 30 tablet, Rfl: 3   glipiZIDE (GLUCOTROL) 10 MG tablet, TAKE 1 TABLET BY MOUTH TWICE DAILY, Disp: 180 tablet, Rfl: 5   insulin degludec (TRESIBA) 100 UNIT/ML FlexTouch Pen, Inject 40 units  into the skin daily, Disp: 15 mL, Rfl: 5   latanoprost (XALATAN) 0.005 % ophthalmic solution, instill 1 drop into affected eye(s) by ophthalmic route once daily in the evening, Disp: 5 mL, Rfl: 1   lisinopril (ZESTRIL) 40 MG tablet, Take 1 tablet (40 mg total) by mouth daily., Disp: 90 tablet, Rfl: 3   meclizine (ANTIVERT) 25 MG tablet, Take 25 mg by mouth 3 (three) times daily as needed for dizziness. , Disp: , Rfl:    meloxicam (MOBIC) 15 MG tablet, Take 1 tablet every day by oral route with meals., Disp: 30 tablet, Rfl: 2   metFORMIN (GLUCOPHAGE) 1000 MG tablet, TAKE 1 TABLET BY MOUTH TWICE DAILY, Disp: 180 tablet, Rfl: 2   Multiple Vitamins-Minerals (CENTRAL-VITE PO), Take 1 tablet by mouth daily., Disp: , Rfl:    Insulin Glargine (BASAGLAR KWIKPEN) 100 UNIT/ML, INJECT 40 UNITS INTO THE SKIN DAILY., Disp: 15 mL, Rfl: 6   lisinopril (ZESTRIL) 20 MG tablet, TAKE 2 TABLETS BY MOUTH DAILY,  Disp: 180 tablet, Rfl: 4   Allergies  Allergen Reactions   No Known Allergies     ROS Review of Systems  Constitutional: Negative.   HENT: Negative.    Eyes: Negative.   Respiratory: Negative.    Cardiovascular: Negative.   Gastrointestinal: Negative.   Endocrine: Negative.   Genitourinary: Negative.   Musculoskeletal: Negative.   Skin: Negative.   Allergic/Immunologic: Negative.   Neurological: Negative.   Hematological: Negative.   Psychiatric/Behavioral: Negative.    All other systems reviewed and are negative.    Objective:    Physical Exam Vitals reviewed.  Constitutional:      Appearance: Normal appearance.  HENT:     Mouth/Throat:     Mouth: Mucous membranes are moist.  Eyes:     Pupils: Pupils are equal, round, and reactive to light.  Neck:     Vascular: No carotid bruit.  Cardiovascular:     Rate and Rhythm: Normal rate and regular rhythm.     Pulses: Normal pulses.     Heart sounds: Normal heart sounds.  Pulmonary:     Effort: Pulmonary effort is normal.     Breath sounds: Normal breath sounds.  Abdominal:     General: Bowel sounds are normal.     Palpations: Abdomen is soft. There is no hepatomegaly, splenomegaly or mass.     Tenderness: There is no abdominal tenderness.     Hernia: No hernia is present.  Musculoskeletal:        General: No tenderness.     Cervical back: Neck supple.     Right lower leg: No edema.     Left lower leg: No edema.  Skin:    Findings: No rash.  Neurological:     Mental Status: She is alert and oriented to person, place, and time.     Motor: No weakness.     Comments: Pain lt leg  Psychiatric:        Mood and Affect: Mood and affect normal.        Behavior: Behavior normal.    BP (!) 145/80    Pulse 84    Ht _0  (1.499 m)    Wt 216 lb 12.8 oz (98.3 kg)    BMI 43.79 kg/m  Wt Readings from Last 3 Encounters:  11/04/21 216 lb 12.8 oz (98.3 kg)  08/04/21 211 lb 12.8 oz (96.1 kg)  01/06/21 198 lb 3.2 oz (89.9  kg)     Health Maintenance Due  Topic Date Due   FOOT EXAM  Never done   TETANUS/TDAP  Never done   PAP SMEAR-Modifier  Never done   Zoster Vaccines- Shingrix (1 of 2) Never done   COLONOSCOPY (Pts 45-91yr Insurance coverage will need to be confirmed)  03/21/2021    There are no preventive care reminders to display for this patient.  Lab Results  Component Value Date   TSH 2.89 08/04/2021   Lab Results  Component Value Date   WBC 7.1 08/04/2021   HGB 12.7 08/04/2021   HCT 38.0 08/04/2021   MCV 86.8 08/04/2021   PLT 352 08/04/2021   Lab Results  Component Value Date   NA 142 08/04/2021   K 4.1 08/04/2021   CO2 21 08/04/2021   GLUCOSE 128 (H) 08/04/2021   BUN 14 08/04/2021   CREATININE 0.62 08/04/2021   BILITOT 0.3 08/04/2021   ALKPHOS 79 09/17/2018   AST 15 08/04/2021   ALT 17 08/04/2021   PROT 7.1 08/04/2021   ALBUMIN 4.3 09/17/2018   CALCIUM 9.4 08/04/2021   ANIONGAP 10 09/17/2018   EGFR 103 08/04/2021   Lab Results  Component Value Date   CHOL 267 (H) 08/04/2021   Lab Results  Component Value Date   HDL 53 08/04/2021   Lab Results  Component Value Date   LDLCALC 174 (H) 08/04/2021   Lab Results  Component Value Date   TRIG 236 (H) 08/04/2021   Lab Results  Component Value Date   CHOLHDL 5.0 (H) 08/04/2021   Lab Results  Component Value Date   HGBA1C 7.7 (H) 08/04/2021      Assessment & Plan:   Problem List Items Addressed This Visit       Cardiovascular and Mediastinum   Hypertension     Patient denies any chest pain or shortness of breath there is no history of palpitation or paroxysmal nocturnal dyspnea   patient was advised to follow low-salt low-cholesterol diet    ideally I want to keep systolic blood pressure below 130 mmHg, patient was asked to check blood pressure one times a week and give me a report on that.  Patient will be follow-up in 3 months  or earlier as needed, patient will call me back for any change in the  cardiovascular symptoms Patient was advised to buy a book from local bookstore concerning blood pressure and read several chapters  every day.  This will be supplemented by some of the material we will give him from the office.  Patient should also utilize other resources like YouTube and Internet to learn more about the blood pressure and the diet.        Endocrine   Type 2 diabetes mellitus without complication, with long[place] insulin [place]    - The patient's blood sugar is labile on med. - The patient will continue the current treatment regimen.  - I encouraged the patient to regularly check blood sugar.  - I encouraged the patient to monitor diet. I encouraged the patient to eat low-carb and low-sugar to help prevent blood sugar spikes.  - I encouraged the patient to continue following their prescribed treatment plan for diabetes - I informed the patient to get help if blood sugar drops below 558mdL, or if suddenly have trouble thinking clearly or breathing.  Patient was advised to buy a book on diabetes from a local bookstore or from [place] Patient should read 2 chapters every day to keep the motivation going, this is in  addition to some of the materials we provided them from the office.  There are other resources on the Internet like YouTube and wilkipedia to get an education on the diabetes        Nervous and Auditory   Carpal tunnel syndrome of right wrist    Stable now        Other   Class 2 obesity due to excess calories without serious comorbidity with body mass index (BMI) of 36.0 to 36.9 in adult    - I encouraged the patient to lose weight.  - I educated them on making healthy dietary choices including eating more fruits and vegetables and less fried foods. - I encouraged the patient to exercise more, and educated on the benefits of exercise including weight loss, diabetes prevention, and hypertension prevention.   Dietary counseling with a registered  dietician  Referral to a weight management support group (e.g. Weight Watchers, Overeaters Anonymous)  If your BMI is greater than 29 or you have gained more than 15 pounds you should work on weight loss.  Attend a healthy cooking class       Other Visit Diagnoses     Type 2 diabetes mellitus without complication, without long-term current use of insulin ([place])    -  Primary   Relevant Orders   POCT glucose (manual entry) (Completed)   Rash       Relevant Orders   Ambulatory referral to Dermatology       No orders of the defined types were placed in this encounter.   Follow-up: No follow-ups on file.    Cletis Athens, MD

## 2021-11-04 NOTE — Assessment & Plan Note (Signed)

## 2021-11-04 NOTE — Assessment & Plan Note (Signed)

## 2021-11-04 NOTE — Assessment & Plan Note (Signed)

## 2021-11-12 ENCOUNTER — Other Ambulatory Visit: Payer: Self-pay

## 2021-11-12 ENCOUNTER — Encounter: Payer: Self-pay | Admitting: Obstetrics and Gynecology

## 2021-11-12 ENCOUNTER — Ambulatory Visit: Payer: 59 | Admitting: Dermatology

## 2021-11-12 DIAGNOSIS — L810 Postinflammatory hyperpigmentation: Secondary | ICD-10-CM | POA: Diagnosis not present

## 2021-11-12 DIAGNOSIS — Z1231 Encounter for screening mammogram for malignant neoplasm of breast: Secondary | ICD-10-CM

## 2021-11-12 DIAGNOSIS — L304 Erythema intertrigo: Secondary | ICD-10-CM

## 2021-11-12 DIAGNOSIS — L814 Other melanin hyperpigmentation: Secondary | ICD-10-CM

## 2021-11-12 DIAGNOSIS — Z124 Encounter for screening for malignant neoplasm of cervix: Secondary | ICD-10-CM

## 2021-11-12 DIAGNOSIS — Z01419 Encounter for gynecological examination (general) (routine) without abnormal findings: Secondary | ICD-10-CM

## 2021-11-12 MED ORDER — KETOCONAZOLE 2 % EX CREA
TOPICAL_CREAM | CUTANEOUS | 2 refills | Status: DC
  Filled 2021-11-12: qty 60, 30d supply, fill #0
  Filled 2021-12-09: qty 60, 30d supply, fill #1
  Filled 2022-01-14: qty 60, 30d supply, fill #2

## 2021-11-12 MED ORDER — MOMETASONE FUROATE 0.1 % EX CREA
TOPICAL_CREAM | CUTANEOUS | 6 refills | Status: DC
  Filled 2021-11-12: qty 45, 30d supply, fill #0
  Filled 2021-12-09: qty 45, 30d supply, fill #1
  Filled 2022-03-09: qty 45, 30d supply, fill #2

## 2021-11-12 NOTE — Progress Notes (Signed)
° °  New Patient Visit  Subjective  Tamara Wells is a 61 y.o. female who presents for the following: Rash (Check a rash under breast ~5 years getting worse itching and burning ).  The following portions of the chart were reviewed this encounter and updated as appropriate:   Tobacco   Allergies   Meds   Problems   Med Hx   Surg Hx   Fam Hx      Review of Systems:  No other skin or systemic complaints except as noted in HPI or Assessment and Plan.  Objective  Well appearing patient in no apparent distress; mood and affect are within normal limits.  A focused examination was performed including chest. Relevant physical exam findings are noted in the Assessment and Plan.  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  It is exacerbated by increased movement / activity; sweating; and higher atmospheric temperature.   Mometasone cr QHS MWF Ketoconazole cr qd 7 days per week in am  Related Medications mometasone (ELOCON) 0.1 % cream Apply to affected skin Monday, Wednesday, Friday at bedtime  ketoconazole (NIZORAL) 2 % cream Apply to affected skin 7 days a week  Return in about 3 months (around 02/09/2022) for intertrigo, PIPA .  IMarye Round, CMA, am acting as scribe for Sarina Ser, MD .  Documentation: I have reviewed the above documentation for accuracy and completeness, and I agree with the above.  Sarina Ser, MD

## 2021-11-12 NOTE — Patient Instructions (Signed)

## 2021-11-18 ENCOUNTER — Other Ambulatory Visit: Payer: Self-pay

## 2021-11-20 ENCOUNTER — Encounter: Payer: Self-pay | Admitting: Dermatology

## 2021-11-25 ENCOUNTER — Other Ambulatory Visit: Payer: Self-pay

## 2021-12-09 ENCOUNTER — Other Ambulatory Visit: Payer: Self-pay

## 2021-12-14 ENCOUNTER — Other Ambulatory Visit: Payer: Self-pay

## 2021-12-15 ENCOUNTER — Other Ambulatory Visit: Payer: Self-pay | Admitting: *Deleted

## 2021-12-15 ENCOUNTER — Other Ambulatory Visit: Payer: Self-pay

## 2021-12-15 MED ORDER — INSULIN GLARGINE-YFGN 100 UNIT/ML ~~LOC~~ SOPN
40.0000 [IU] | PEN_INJECTOR | Freq: Every day | SUBCUTANEOUS | 6 refills | Status: DC
  Filled 2021-12-15: qty 3, 7d supply, fill #0

## 2021-12-26 LAB — HM MAMMOGRAPHY: HM Mammogram: NORMAL (ref 0–4)

## 2021-12-31 ENCOUNTER — Ambulatory Visit: Payer: 59 | Admitting: Obstetrics and Gynecology

## 2021-12-31 ENCOUNTER — Encounter: Payer: Self-pay | Admitting: Obstetrics and Gynecology

## 2021-12-31 VITALS — BP 136/82 | HR 97 | Ht 59.0 in | Wt 208.6 lb

## 2021-12-31 DIAGNOSIS — Z7689 Persons encountering health services in other specified circumstances: Secondary | ICD-10-CM | POA: Diagnosis not present

## 2021-12-31 DIAGNOSIS — Z1231 Encounter for screening mammogram for malignant neoplasm of breast: Secondary | ICD-10-CM | POA: Diagnosis not present

## 2021-12-31 DIAGNOSIS — Z01419 Encounter for gynecological examination (general) (routine) without abnormal findings: Secondary | ICD-10-CM | POA: Diagnosis not present

## 2021-12-31 DIAGNOSIS — Z124 Encounter for screening for malignant neoplasm of cervix: Secondary | ICD-10-CM

## 2021-12-31 NOTE — Progress Notes (Signed)
HPI: ?     [name] is a 61 y.o. [phone] who LMP was No LMP recorded. Patient has had a hysterectomy. ? ?Subjective:  ? ?She presents today for her annual examination.  She has no complaints today.  She has been treated for a skin condition around her breast that causes itching.  She says the medications from her dermatologist are working. ?She has previously had a hysterectomy for uterine fibroids. "  More than 20 years ago." ? ? ?  Hx: ?The following portions of the patient's history were reviewed and updated as appropriate: ?            She  has a past medical history of Diabetes mellitus without complication ([place]), Hyperlipidemia, Hypertension, Sleep apnea, and Vertigo. ?She does not have any pertinent problems on file. ?She  has a past surgical history that includes Abdominal hysterectomy; Tonsillectomy; Carpal tunnel release (Left); Carpal tunnel release (Right, 05/28/2019); and Colonoscopy with propofol (N/A, 03/21/2020). ?Her family history includes Diabetes in her father, maternal grandmother, and mother. ?She  reports that she quit smoking about 17 years ago. Her smoking use included cigarettes. She has never used smokeless tobacco. She reports that she does not drink alcohol and does not use drugs. ?She has a current medication list which includes the following prescription(s): acetaminophen, amlodipine, aspirin ec, black cohosh, diphenhydramine, furosemide, gabapentin, glipizide, insulin degludec, insulin glargine-yfgn, ketoconazole, latanoprost, lisinopril, meclizine, meloxicam, metformin, mometasone, multiple vitamins-minerals, rosuvastatin, and lisinopril. ?She is allergic to no known allergies. ?      ?Review of Systems:  ?Review of Systems ? ?Constitutional: Denied constitutional symptoms, night sweats, recent illness, fatigue, fever, insomnia and weight loss.  ?Eyes: Denied eye symptoms, eye pain, photophobia, vision change and visual disturbance.  ?Ears/Nose/Throat/Neck: Denied ear,  nose, throat or neck symptoms, hearing loss, nasal discharge, sinus congestion and sore throat.  ?Cardiovascular: Denied cardiovascular symptoms, arrhythmia, chest pain/pressure, edema, exercise intolerance, orthopnea and palpitations.  ?Respiratory: Denied pulmonary symptoms, asthma, pleuritic pain, productive sputum, cough, dyspnea and wheezing.  ?Gastrointestinal: Denied, gastro-esophageal reflux, melena, nausea and vomiting.  ?Genitourinary: Denied genitourinary symptoms including symptomatic vaginal discharge, pelvic relaxation issues, and urinary complaints.  ?Musculoskeletal: Denied musculoskeletal symptoms, stiffness, swelling, muscle weakness and myalgia.  ?Dermatologic: Denied dermatology symptoms, rash and scar.  ?Neurologic: Denied neurology symptoms, dizziness, headache, neck pain and syncope.  ?Psychiatric: Denied psychiatric symptoms, anxiety and depression.  ?Endocrine: Denied endocrine symptoms including hot flashes and night sweats.  ? ?Meds: ?  ?Current Outpatient Medications on File Prior to Visit  ?Medication Sig Dispense Refill  ? acetaminophen (TYLENOL) 500 MG tablet Take 1,000 mg by mouth every 6 (six) hours as needed for moderate pain or headache.     ? amLODipine (NORVASC) 5 MG tablet Take 5 mg by mouth daily.    ? aspirin EC 81 MG tablet Take 81 mg by mouth daily.    ? Black Cohosh 40 MG CAPS Take 40 mg by mouth 2 (two) times daily.    ? diphenhydrAMINE (BENADRYL) 25 MG tablet Take 25 mg by mouth daily as needed for allergies.    ? furosemide (LASIX) 20 MG tablet Take 1 tablet (20 mg total) by mouth every other day. 30 tablet 3  ? gabapentin (NEURONTIN) 100 MG capsule Take 1 capsule (100 mg total) by mouth 3 (three) times daily. 90 capsule 3  ? glipiZIDE (GLUCOTROL) 10 MG tablet TAKE 1 TABLET BY MOUTH TWICE DAILY 180 tablet 5  ? insulin degludec (TRESIBA) 100 UNIT/ML FlexTouch  Pen Inject 40 units into the skin daily 15 mL 5  ? insulin glargine-yfgn (SEMGLEE) 100 UNIT/ML Pen Inject 40 Units  into the skin daily. 15 mL 6  ? ketoconazole (NIZORAL) 2 % cream Apply to affected skin 7 days a week 60 g 2  ? latanoprost (XALATAN) 0.005 % ophthalmic solution instill 1 drop into affected eye(s) by ophthalmic route once daily in the evening 5 mL 1  ? lisinopril (ZESTRIL) 40 MG tablet Take 1 tablet (40 mg total) by mouth daily. 90 tablet 3  ? meclizine (ANTIVERT) 25 MG tablet Take 25 mg by mouth 3 (three) times daily as needed for dizziness.     ? meloxicam (MOBIC) 15 MG tablet Take 1 tablet every day by oral route with meals. 30 tablet 2  ? metFORMIN (GLUCOPHAGE) 1000 MG tablet TAKE 1 TABLET BY MOUTH TWICE DAILY 180 tablet 2  ? mometasone (ELOCON) 0.1 % cream Apply to affected skin Monday, Wednesday, Friday at bedtime 45 g 6  ? Multiple Vitamins-Minerals (CENTRAL-VITE PO) Take 1 tablet by mouth daily.    ? rosuvastatin (CRESTOR) 10 MG tablet Take 1 tablet (10 mg total) by mouth daily. 90 tablet 3  ? lisinopril (ZESTRIL) 20 MG tablet TAKE 2 TABLETS BY MOUTH DAILY 180 tablet 4  ? ?No current facility-administered medications on file prior to visit.  ? ? ? ?Objective:  ?  ? ?Vitals:  ? 12/31/21 0755  ?BP: 136/82  ?Pulse: 97  ?  ?Filed Weights  ? 12/31/21 0755  ?Weight: 208 lb 9.6 oz (94.6 kg)  ? ?  ?         Physical examination ?General NAD, Conversant  ?HEENT Atraumatic; Op clear with mmm.  Normo-cephalic. Pupils reactive. Anicteric sclerae  ?Thyroid/Neck Smooth without nodularity or enlargement. Normal ROM.  Neck Supple.  ?Skin No rashes, lesions or ulceration. Normal palpated skin turgor. No nodularity.  ?Breasts: No masses or discharge.  Symmetric.  No axillary adenopathy.  ?Lungs: Clear to auscultation.No rales or wheezes. Normal Respiratory effort, no retractions.  ?Heart: NSR.  No murmurs or rubs appreciated. No periferal edema  ?Abdomen: Soft.  Non-tender.  No masses.  No HSM. No hernia  ?Extremities: Moves all appropriately.  Normal ROM for age. No lymphadenopathy.  ?Neuro: Oriented to PPT.  Normal mood.  Normal affect.  ? ?  Pelvic:   ?Vulva: Normal appearance.  No lesions.   ?Vagina: No lesions or abnormalities noted.  ?Support: Normal pelvic support.  ?Urethra No masses tenderness or scarring.  ?Meatus Normal size without lesions or prolapse.  ?Cervix: Surgically absent   ?Anus: Normal exam.  No lesions.  ?Perineum: Normal exam.  No lesions.  ?      Bimanual   ?Uterus: Surgically absent   ?Adnexae: No masses.  Non-tender to palpation.  ?Cul-de-sac: Negative for abnormality.  ? ? ? ?Assessment:  ?  ?[id number] ?Patient Active Problem List  ? Diagnosis Date Noted  ? Encounter for screening colonoscopy   ? Polyp of colon   ? Hypertension 02/21/2020  ? Type 2 diabetes mellitus without complication, with long-term current use of insulin ([place]) 02/21/2020  ? Class 2 obesity due to excess calories without serious comorbidity with body mass index (BMI) of 36.0 to 36.9 in adult 02/21/2020  ? Annual physical exam 02/21/2020  ? Ganglion cyst of tendon sheath of left hand 11/02/2019  ? Carpal tunnel syndrome of right wrist 04/20/2019  ? ?  ?1. Well woman exam with routine gynecological exam   ?2.  Establishing care with new doctor, encounter for   ?3. Screening mammogram for breast cancer   ? ?  ? ? ?Plan:  ?  ?       ? 1.  Basic Screening Recommendations ?The basic screening recommendations for asymptomatic women were discussed with the patient during her visit.  The age-appropriate recommendations were discussed with her and the rational for the tests reviewed.  When I am informed by the patient that another primary care physician has previously obtained the age-appropriate tests and they are up-to-date, only outstanding tests are ordered and referrals given as necessary.  Abnormal results of tests will be discussed with her when all of her results are completed.  Routine preventative health maintenance measures emphasized: Exercise/Diet/Weight control, Tobacco Warnings, Alcohol/Substance use risks and Stress  Management ? Mammogram ordered-lab work today. ? ?Orders ?Orders Placed This Encounter  ?Procedures  ? MM DIGITAL SCREENING BILATERAL  ? Basic metabolic panel  ? CBC  ? TSH  ? Lipid panel  ? Hemoglobin A1c  ? ? No orders of

## 2021-12-31 NOTE — Progress Notes (Signed)
Patients presents for annual exam today. Patient states she had a partial hysterectomy in 2000.  Patient is due for mammogram, ordered. Patients annual labs are ordered. Patient states no other questions or concerns at this time.  ? ?

## 2022-01-01 LAB — BASIC METABOLIC PANEL
BUN/Creatinine Ratio: 23 (ref 9–23)
BUN: 15 mg/dL (ref 6–24)
CO2: 25 mmol/L (ref 20–29)
Calcium: 9.5 mg/dL (ref 8.7–10.2)
Chloride: 100 mmol/L (ref 96–106)
Creatinine, Ser: 0.64 mg/dL (ref 0.57–1.00)
Glucose: 174 mg/dL — ABNORMAL HIGH (ref 70–99)
Potassium: 4.6 mmol/L (ref 3.5–5.2)
Sodium: 140 mmol/L (ref 134–144)
eGFR: 102 mL/min/{1.73_m2} (ref >59)

## 2022-01-01 LAB — CBC
Hematocrit: 38.7 % (ref 34.0–46.6)
Hemoglobin: 12.8 g/dL (ref 11.1–15.9)
MCH: 28.6 pg (ref 26.6–33.0)
MCHC: 33.1 g/dL (ref 31.5–35.7)
MCV: 86 fL (ref 79–97)
Platelets: 318 10*3/uL (ref 150–450)
RBC: 4.48 x10E6/uL (ref 3.77–5.28)
RDW: 13.6 % (ref 11.7–15.4)
WBC: 7.8 10*3/uL (ref 3.4–10.8)

## 2022-01-01 LAB — LIPID PANEL
Chol/HDL Ratio: 3.1 ratio (ref 0.0–4.4)
Cholesterol, Total: 142 mg/dL (ref 100–199)
HDL: 46 mg/dL (ref >39)
LDL Chol Calc (NIH): 76 mg/dL (ref 0–99)
Triglycerides: 108 mg/dL (ref 0–149)
VLDL Cholesterol Cal: 20 mg/dL (ref 5–40)

## 2022-01-01 LAB — HEMOGLOBIN A1C
Est. average glucose Bld gHb Est-mCnc: 246 mg/dL
Hgb A1c MFr Bld: 10.2 % — ABNORMAL HIGH (ref 4.8–5.6)

## 2022-01-01 LAB — TSH: TSH: 1.58 u[IU]/mL (ref 0.450–4.500)

## 2022-01-04 NOTE — Progress Notes (Signed)
Tamara Wells: ?Your glucose and A1c results show that your diabetes has not been well controlled.  You should see your family doctor or internist in the very near future to get this under control.

## 2022-01-06 ENCOUNTER — Ambulatory Visit: Payer: 59 | Admitting: Internal Medicine

## 2022-01-08 ENCOUNTER — Ambulatory Visit (INDEPENDENT_AMBULATORY_CARE_PROVIDER_SITE_OTHER): Payer: 59 | Admitting: Nurse Practitioner

## 2022-01-08 ENCOUNTER — Other Ambulatory Visit: Payer: Self-pay

## 2022-01-08 ENCOUNTER — Encounter: Payer: Self-pay | Admitting: Nurse Practitioner

## 2022-01-08 VITALS — BP 151/88 | HR 105 | Ht 59.0 in | Wt 212.4 lb

## 2022-01-08 DIAGNOSIS — Z6841 Body Mass Index (BMI) 40.0 and over, adult: Secondary | ICD-10-CM | POA: Diagnosis not present

## 2022-01-08 DIAGNOSIS — I10 Essential (primary) hypertension: Secondary | ICD-10-CM

## 2022-01-08 DIAGNOSIS — E119 Type 2 diabetes mellitus without complications: Secondary | ICD-10-CM | POA: Diagnosis not present

## 2022-01-08 DIAGNOSIS — Z794 Long term (current) use of insulin: Secondary | ICD-10-CM | POA: Diagnosis not present

## 2022-01-08 LAB — GLUCOSE, POCT (MANUAL RESULT ENTRY): POC Glucose: 247 mg/dl — AB (ref 70–99)

## 2022-01-08 NOTE — Progress Notes (Signed)
? ?Established Patient Office Visit ? ?Subjective:  ?Patient ID: Tamara Wells, female    DOB: June 30, 1961  Age: 61 y.o. MRN: [id number] ? ?CC:  ?Chief Complaint  ?Patient presents with  ? Diabetes  ?  Patient is here for 2 month diabetes follow up  ? ? ? ?HPI ? ?Tamara Wells presents for follow up of diabetes. Her HgA1c on 12/31/21 is 10.2 and her previous HgA1c on 08/04/22 was 7.7. Patient states that she works 12 hours a day and three time a week. She does not eat healthy food. She drink soda and eat chips in the break.  ?Patient is on metformin 1000 mg twice daily, glipizide 10 mg twice daily and 40 units insulin Tresiba 100 unit/ml. ?Patient states that her insurance does not cover tresiba and would like to know her other options.  ?HPI  ? ?Past Medical History:  ?Diagnosis Date  ? Diabetes mellitus without complication ([place])   ? Dx 2006  ? Hyperlipidemia   ? Hypertension   ? Sleep apnea   ? Vertigo   ? 2002  ? ? ?Past Surgical History:  ?Procedure Laterality Date  ? ABDOMINAL HYSTERECTOMY    ? 2000  ? CARPAL TUNNEL RELEASE Left   ? CARPAL TUNNEL RELEASE Right 05/28/2019  ? Procedure: CARPAL TUNNEL RELEASE;  Surgeon: Earnestine Leys, MD;  Location: [facility] ORS;  Service: Orthopedics;  Laterality: Right;  ? COLONOSCOPY WITH PROPOFOL N/A 03/21/2020  ? Procedure: COLONOSCOPY WITH PROPOFOL;  Surgeon: Virgel Manifold, MD;  Location: [facility] ENDOSCOPY;  Service: Endoscopy;  Laterality: N/A;  ? TONSILLECTOMY    ? ? ?Family History  ?Problem Relation Age of Onset  ? Diabetes Mother   ? Diabetes Father   ? Diabetes Maternal Grandmother   ? Breast cancer Neg Hx   ? ? ?Social History  ? ?Socioeconomic History  ? Marital status: Married  ?  Spouse name: Not on file  ? Number of children: Not on file  ? Years of education: Not on file  ? Highest education level: Not on file  ?Occupational History  ? Not on file  ?Tobacco Use  ? Smoking status: Former  ?  Types: Cigarettes  ?  Quit date: 09/27/2004  ?  Years since quitting: 17.3   ? Smokeless tobacco: Never  ?Vaping Use  ? Vaping Use: Never used  ?Substance and Sexual Activity  ? Alcohol use: No  ? Drug use: No  ? Sexual activity: Not Currently  ?  Birth control/protection: Surgical  ?Other Topics Concern  ? Not on file  ?Social History Narrative  ? Not on file  ? ?Social Determinants of Health  ? ?Financial Resource Strain: Not on file  ?Food Insecurity: Not on file  ?Transportation Needs: Not on file  ?Physical Activity: Not on file  ?Stress: Not on file  ?Social Connections: Not on file  ?Intimate Partner Violence: Not on file  ? ? ? ?Outpatient Medications Prior to Visit  ?Medication Sig Dispense Refill  ? acetaminophen (TYLENOL) 500 MG tablet Take 1,000 mg by mouth every 6 (six) hours as needed for moderate pain or headache.     ? amLODipine (NORVASC) 5 MG tablet Take 5 mg by mouth daily.    ? aspirin EC 81 MG tablet Take 81 mg by mouth daily.    ? Black Cohosh 40 MG CAPS Take 40 mg by mouth 2 (two) times daily.    ? diphenhydrAMINE (BENADRYL) 25 MG tablet Take 25 mg by mouth daily  as needed for allergies.    ? furosemide (LASIX) 20 MG tablet Take 1 tablet (20 mg total) by mouth every other day. 30 tablet 3  ? gabapentin (NEURONTIN) 100 MG capsule Take 1 capsule (100 mg total) by mouth 3 (three) times daily. 90 capsule 3  ? glipiZIDE (GLUCOTROL) 10 MG tablet TAKE 1 TABLET BY MOUTH TWICE DAILY 180 tablet 5  ? insulin degludec (TRESIBA) 100 UNIT/ML FlexTouch Pen Inject 40 units into the skin daily 15 mL 5  ? ketoconazole (NIZORAL) 2 % cream Apply to affected skin 7 days a week 60 g 2  ? latanoprost (XALATAN) 0.005 % ophthalmic solution instill 1 drop into affected eye(s) by ophthalmic route once daily in the evening 5 mL 1  ? lisinopril (ZESTRIL) 40 MG tablet Take 1 tablet (40 mg total) by mouth daily. 90 tablet 3  ? meclizine (ANTIVERT) 25 MG tablet Take 25 mg by mouth 3 (three) times daily as needed for dizziness.     ? meloxicam (MOBIC) 15 MG tablet Take 1 tablet every day by oral  route with meals. 30 tablet 2  ? metFORMIN (GLUCOPHAGE) 1000 MG tablet TAKE 1 TABLET BY MOUTH TWICE DAILY 180 tablet 2  ? mometasone (ELOCON) 0.1 % cream Apply to affected skin Monday, Wednesday, Friday at bedtime 45 g 6  ? Multiple Vitamins-Minerals (CENTRAL-VITE PO) Take 1 tablet by mouth daily.    ? rosuvastatin (CRESTOR) 10 MG tablet Take 1 tablet (10 mg total) by mouth daily. 90 tablet 3  ? insulin glargine-yfgn (SEMGLEE) 100 UNIT/ML Pen Inject 40 Units into the skin daily. (Patient not taking: Reported on 01/08/2022) 15 mL 6  ? lisinopril (ZESTRIL) 20 MG tablet TAKE 2 TABLETS BY MOUTH DAILY 180 tablet 4  ? ?No facility-administered medications prior to visit.  ? ? ?Allergies  ?Allergen Reactions  ? No Known Allergies   ? ? ?ROS ?Review of Systems  ?Constitutional:  Negative for activity change, appetite change and fever.  ?HENT:  Negative for congestion, ear pain and sinus pain.   ?Eyes:  Negative for discharge and redness.  ?Respiratory:  Negative for apnea and shortness of breath.   ?Cardiovascular:  Negative for chest pain and palpitations.  ?Gastrointestinal:  Negative for abdominal distention, blood in stool, nausea and vomiting.  ?Endocrine: Negative for cold intolerance and polydipsia.  ?Genitourinary:  Negative for difficulty urinating and flank pain.  ?Musculoskeletal:  Negative for arthralgias and joint swelling.  ?Neurological:  Negative for dizziness, facial asymmetry and headaches.  ?Psychiatric/Behavioral:  Negative for agitation, behavioral problems and confusion.   ? ?  ?Objective:  ?  ?Physical Exam ?Constitutional:   ?   Appearance: Normal appearance. She is obese.  ?HENT:  ?   Head: Normocephalic and atraumatic.  ?   Right Ear: Tympanic membrane normal.  ?   Left Ear: Tympanic membrane normal.  ?   Nose: Nose normal.  ?Eyes:  ?   Extraocular Movements: Extraocular movements intact.  ?   Conjunctiva/sclera: Conjunctivae normal.  ?   Pupils: Pupils are equal, round, and reactive to light.   ?Cardiovascular:  ?   Rate and Rhythm: Normal rate and regular rhythm.  ?   Pulses: Normal pulses.  ?   Heart sounds: Normal heart sounds.  ?Pulmonary:  ?   Effort: Pulmonary effort is normal.  ?   Breath sounds: Normal breath sounds.  ?Abdominal:  ?   General: Bowel sounds are normal.  ?   Palpations: Abdomen is soft.  ?Musculoskeletal:     ?  General: Normal range of motion.  ?   Cervical back: Normal range of motion.  ?Skin: ?   General: Skin is warm.  ?   Capillary Refill: Capillary refill takes less than 2 seconds.  ?Neurological:  ?   General: No focal deficit present.  ?   Mental Status: She is alert and oriented to person, place, and time. Mental status is at baseline.  ?Psychiatric:     ?   Mood and Affect: Mood normal.     ?   Behavior: Behavior normal.     ?   Thought Content: Thought content normal.     ?   Judgment: Judgment normal.  ? ? ?BP (!) 151/88   Pulse (!) 105   Ht 4' 11"  (1.499 m)   Wt 212 lb 6.4 oz (96.3 kg)   BMI 42.90 kg/m?  ?Wt Readings from Last 3 Encounters:  ?01/08/22 212 lb 6.4 oz (96.3 kg)  ?12/31/21 208 lb 9.6 oz (94.6 kg)  ?11/04/21 216 lb 12.8 oz (98.3 kg)  ? ? ? ?Health Maintenance Due  ?Topic Date Due  ? FOOT EXAM  Never done  ? TETANUS/TDAP  Never done  ? PAP SMEAR-Modifier  Never done  ? Zoster Vaccines- Shingrix (1 of 2) Never done  ? COLONOSCOPY (Pts 45-46yr Insurance coverage will need to be confirmed)  03/21/2021  ? ? ?There are no preventive care reminders to display for this patient. ? ?Lab Results  ?Component Value Date  ? TSH 1.580 12/31/2021  ? ?Lab Results  ?Component Value Date  ? WBC 7.8 12/31/2021  ? HGB 12.8 12/31/2021  ? HCT 38.7 12/31/2021  ? MCV 86 12/31/2021  ? PLT 318 12/31/2021  ? ?Lab Results  ?Component Value Date  ? NA 140 12/31/2021  ? K 4.6 12/31/2021  ? CO2 25 12/31/2021  ? GLUCOSE 174 (H) 12/31/2021  ? BUN 15 12/31/2021  ? CREATININE 0.64 12/31/2021  ? BILITOT 0.3 08/04/2021  ? ALKPHOS 79 09/17/2018  ? AST 15 08/04/2021  ? ALT 17 08/04/2021  ?  PROT 7.1 08/04/2021  ? ALBUMIN 4.3 09/17/2018  ? CALCIUM 9.5 12/31/2021  ? ANIONGAP 10 09/17/2018  ? EGFR 102 12/31/2021  ? ?Lab Results  ?Component Value Date  ? CHOL 142 12/31/2021  ? ?Lab Results  ?Component Va

## 2022-01-13 NOTE — Assessment & Plan Note (Addendum)
Patient [id number] in the office today. ?Advised pt to check the BS regularly, make a log and bring to next appointment.  ?Advised pt to monitor diet. ?Advised pt to eat variety of food including fruits, vegetables, whole grains, complex carbohydrates and proteins.  ?Encouraged patient to eat healthy and balanced diet. ?Will adjust the medication if needed. ?

## 2022-01-13 NOTE — Assessment & Plan Note (Signed)
BMI 42.90 ?Advised pt to lose weight. ?Advised patient to avoid trans fat, fatty and fried food. ?Follow a regular physical activity schedule. ?Went over the risk of chronic diseases with increased weight.   ? ? ? ?

## 2022-01-13 NOTE — Assessment & Plan Note (Signed)
Pt blood pressure 151/88 ?Advise pt to limit the intake of sodium. ?Advise pt to increase the intake of water, perform regular exercise and  cut down smoking.  ?Advise pt to measure the BP at home and bring it to the next appointment.  ?Would consider adjusting the medication dosage if needed. ? ? ?

## 2022-01-14 ENCOUNTER — Other Ambulatory Visit: Payer: Self-pay

## 2022-01-14 MED ORDER — AZITHROMYCIN 250 MG PO TABS
ORAL_TABLET | ORAL | 0 refills | Status: AC
  Filled 2022-01-14: qty 6, 5d supply, fill #0

## 2022-01-22 ENCOUNTER — Encounter: Payer: Self-pay | Admitting: Nurse Practitioner

## 2022-01-22 ENCOUNTER — Ambulatory Visit: Payer: 59 | Admitting: Nurse Practitioner

## 2022-01-22 ENCOUNTER — Other Ambulatory Visit: Payer: Self-pay

## 2022-01-22 VITALS — BP 179/111 | HR 101 | Ht 59.0 in | Wt 214.4 lb

## 2022-01-22 DIAGNOSIS — R058 Other specified cough: Secondary | ICD-10-CM

## 2022-01-22 DIAGNOSIS — I10 Essential (primary) hypertension: Secondary | ICD-10-CM

## 2022-01-22 DIAGNOSIS — E119 Type 2 diabetes mellitus without complications: Secondary | ICD-10-CM | POA: Diagnosis not present

## 2022-01-22 DIAGNOSIS — Z6841 Body Mass Index (BMI) 40.0 and over, adult: Secondary | ICD-10-CM

## 2022-01-22 DIAGNOSIS — R059 Cough, unspecified: Secondary | ICD-10-CM | POA: Insufficient documentation

## 2022-01-22 DIAGNOSIS — Z794 Long term (current) use of insulin: Secondary | ICD-10-CM | POA: Diagnosis not present

## 2022-01-22 LAB — GLUCOSE, POCT (MANUAL RESULT ENTRY): POC Glucose: 171 mg/dl — AB (ref 70–99)

## 2022-01-22 MED ORDER — INSULIN GLARGINE-YFGN 100 UNIT/ML ~~LOC~~ SOPN
40.0000 [IU] | PEN_INJECTOR | Freq: Every day | SUBCUTANEOUS | 5 refills | Status: DC
  Filled 2022-01-22: qty 15, 37d supply, fill #0
  Filled 2022-03-09: qty 15, 37d supply, fill #1

## 2022-01-22 MED ORDER — FLUTICASONE PROPIONATE 50 MCG/ACT NA SUSP
2.0000 | Freq: Every day | NASAL | 6 refills | Status: DC
  Filled 2022-01-22: qty 16, 30d supply, fill #0
  Filled 2022-08-04: qty 16, 30d supply, fill #1

## 2022-01-22 MED ORDER — AMLODIPINE BESYLATE 5 MG PO TABS
5.0000 mg | ORAL_TABLET | Freq: Every day | ORAL | 2 refills | Status: DC
Start: 2022-01-22 — End: 2022-03-19
  Filled 2022-01-22: qty 90, 90d supply, fill #0

## 2022-01-22 MED ORDER — BENZONATATE 100 MG PO CAPS
100.0000 mg | ORAL_CAPSULE | Freq: Two times a day (BID) | ORAL | 0 refills | Status: DC | PRN
  Filled 2022-01-22: qty 20, 10d supply, fill #0

## 2022-01-22 NOTE — Progress Notes (Signed)
? ?Established Patient Office Visit ? ?Subjective:  ?Patient ID: Tamara Wells, female    DOB: 03-18-1961  Age: 61 y.o. MRN: [id number] ? ?CC:  ?Chief Complaint  ?Patient presents with  ? Diabetes  ?  2 week follow up  ? ? ? ?HPI ? ?[name] presents for 2 weeks follow up on diabetes. Her last HgA1c 12/31/21 is 10.2. Patient average fasting BS at home is 180. Patient is on metformin 1000 mg twice daily, glipizide 10 mg twice daily and 40 units insulin Tresiba 100 unit/ml. Patient also states that her insurance is not covering [place].  She called the insurance and checked they will cover semglee. ? ?She also complaints of cough and congestion going from more than two weeks. ? ? ?Past Medical History:  ?Diagnosis Date  ? Diabetes mellitus without complication ([place])   ? Dx 2006  ? Hyperlipidemia   ? Hypertension   ? Sleep apnea   ? Vertigo   ? 2002  ? ? ?Past Surgical History:  ?Procedure Laterality Date  ? ABDOMINAL HYSTERECTOMY    ? 2000  ? CARPAL TUNNEL RELEASE Left   ? CARPAL TUNNEL RELEASE Right 05/28/2019  ? Procedure: CARPAL TUNNEL RELEASE;  Surgeon: Earnestine Leys, MD;  Location: [facility] ORS;  Service: Orthopedics;  Laterality: Right;  ? COLONOSCOPY WITH PROPOFOL N/A 03/21/2020  ? Procedure: COLONOSCOPY WITH PROPOFOL;  Surgeon: Virgel Manifold, MD;  Location: [facility] ENDOSCOPY;  Service: Endoscopy;  Laterality: N/A;  ? TONSILLECTOMY    ? ? ?Family History  ?Problem Relation Age of Onset  ? Diabetes Mother   ? Diabetes Father   ? Diabetes Maternal Grandmother   ? Breast cancer Neg Hx   ? ? ?Social History  ? ?Socioeconomic History  ? Marital status: Married  ?  Spouse name: Not on file  ? Number of children: Not on file  ? Years of education: Not on file  ? Highest education level: Not on file  ?Occupational History  ? Not on file  ?Tobacco Use  ? Smoking status: Former  ?  Types: Cigarettes  ?  Quit date: 09/27/2004  ?  Years since quitting: 17.3  ? Smokeless tobacco: Never  ?Vaping Use  ? Vaping Use: Never  used  ?Substance and Sexual Activity  ? Alcohol use: No  ? Drug use: No  ? Sexual activity: Not Currently  ?  Birth control/protection: Surgical  ?Other Topics Concern  ? Not on file  ?Social History Narrative  ? Not on file  ? ?Social Determinants of Health  ? ?Financial Resource Strain: Not on file  ?Food Insecurity: Not on file  ?Transportation Needs: Not on file  ?Physical Activity: Not on file  ?Stress: Not on file  ?Social Connections: Not on file  ?Intimate Partner Violence: Not on file  ? ? ? ?Outpatient Medications Prior to Visit  ?Medication Sig Dispense Refill  ? acetaminophen (TYLENOL) 500 MG tablet Take 1,000 mg by mouth every 6 (six) hours as needed for moderate pain or headache.     ? aspirin EC 81 MG tablet Take 81 mg by mouth daily.    ? Black Cohosh 40 MG CAPS Take 40 mg by mouth 2 (two) times daily.    ? diphenhydrAMINE (BENADRYL) 25 MG tablet Take 25 mg by mouth daily as needed for allergies.    ? furosemide (LASIX) 20 MG tablet Take 1 tablet (20 mg total) by mouth every other day. 30 tablet 3  ? gabapentin (NEURONTIN) 100  MG capsule Take 1 capsule (100 mg total) by mouth 3 (three) times daily. 90 capsule 3  ? glipiZIDE (GLUCOTROL) 10 MG tablet TAKE 1 TABLET BY MOUTH TWICE DAILY 180 tablet 5  ? ketoconazole (NIZORAL) 2 % cream Apply to affected skin 7 days a week 60 g 2  ? latanoprost (XALATAN) 0.005 % ophthalmic solution instill 1 drop into affected eye(s) by ophthalmic route once daily in the evening 5 mL 1  ? lisinopril (ZESTRIL) 40 MG tablet Take 1 tablet (40 mg total) by mouth daily. 90 tablet 3  ? meclizine (ANTIVERT) 25 MG tablet Take 25 mg by mouth 3 (three) times daily as needed for dizziness.     ? meloxicam (MOBIC) 15 MG tablet Take 1 tablet every day by oral route with meals. 30 tablet 2  ? metFORMIN (GLUCOPHAGE) 1000 MG tablet TAKE 1 TABLET BY MOUTH TWICE DAILY 180 tablet 2  ? mometasone (ELOCON) 0.1 % cream Apply to affected skin Monday, Wednesday, Friday at bedtime 45 g 6  ?  Multiple Vitamins-Minerals (CENTRAL-VITE PO) Take 1 tablet by mouth daily.    ? rosuvastatin (CRESTOR) 10 MG tablet Take 1 tablet (10 mg total) by mouth daily. 90 tablet 3  ? amLODipine (NORVASC) 5 MG tablet Take 5 mg by mouth daily.    ? insulin degludec (TRESIBA) 100 UNIT/ML FlexTouch Pen Inject 40 units into the skin daily 15 mL 5  ? ?No facility-administered medications prior to visit.  ? ? ?Allergies  ?Allergen Reactions  ? No Known Allergies   ? ? ?ROS ?Review of Systems  ?Constitutional:  Negative for activity change, appetite change and fever.  ?HENT:  Positive for congestion. Negative for ear pain and sinus pain.   ?Eyes:  Negative for discharge and redness.  ?Respiratory:  Positive for cough. Negative for apnea and shortness of breath.   ?Cardiovascular:  Negative for chest pain and palpitations.  ?Gastrointestinal:  Negative for abdominal distention, blood in stool, nausea and vomiting.  ?Endocrine: Negative for cold intolerance and polydipsia.  ?Genitourinary:  Negative for difficulty urinating and flank pain.  ?Musculoskeletal:  Negative for arthralgias and joint swelling.  ?Neurological:  Negative for dizziness, facial asymmetry and headaches.  ?Psychiatric/Behavioral:  Negative for agitation, behavioral problems and confusion.   ? ?  ?Objective:  ?  ?Physical Exam ?Constitutional:   ?   Appearance: Normal appearance. She is morbidly obese.  ?HENT:  ?   Head: Normocephalic and atraumatic.  ?   Right Ear: Tympanic membrane normal.  ?   Left Ear: Tympanic membrane normal.  ?   Nose: Congestion present.  ?   Mouth/Throat:  ?   Dentition: Has dentures.  ?Eyes:  ?   Extraocular Movements: Extraocular movements intact.  ?   Conjunctiva/sclera: Conjunctivae normal.  ?   Pupils: Pupils are equal, round, and reactive to light.  ?Cardiovascular:  ?   Rate and Rhythm: Normal rate and regular rhythm.  ?   Pulses: Normal pulses.  ?   Heart sounds: Normal heart sounds.  ?Pulmonary:  ?   Effort: Pulmonary effort is  normal.  ?   Breath sounds: Normal breath sounds.  ?Abdominal:  ?   General: Bowel sounds are normal.  ?   Palpations: Abdomen is soft.  ?Musculoskeletal:     ?   General: Normal range of motion.  ?   Cervical back: Normal range of motion.  ?Skin: ?   General: Skin is warm.  ?   Capillary Refill: Capillary refill  takes less than 2 seconds.  ?Neurological:  ?   General: No focal deficit present.  ?   Mental Status: She is alert and oriented to person, place, and time. Mental status is at baseline.  ?Psychiatric:     ?   Mood and Affect: Mood normal.     ?   Behavior: Behavior normal.     ?   Thought Content: Thought content normal.     ?   Judgment: Judgment normal.  ? ? ?BP (!) 179/111   Pulse (!) 101   Ht 4' 11" (1.499 m)   Wt 214 lb 6.4 oz (97.3 kg)   BMI 43.30 kg/m?  ?Wt Readings from Last 3 Encounters:  ?01/22/22 214 lb 6.4 oz (97.3 kg)  ?01/08/22 212 lb 6.4 oz (96.3 kg)  ?12/31/21 208 lb 9.6 oz (94.6 kg)  ? ? ? ?Health Maintenance Due  ?Topic Date Due  ? FOOT EXAM  Never done  ? TETANUS/TDAP  Never done  ? PAP SMEAR-Modifier  Never done  ? Zoster Vaccines- Shingrix (1 of 2) Never done  ? COLONOSCOPY (Pts 45-67yr Insurance coverage will need to be confirmed)  03/21/2021  ? ? ?There are no preventive care reminders to display for this patient. ? ?Lab Results  ?Component Value Date  ? TSH 1.580 12/31/2021  ? ?Lab Results  ?Component Value Date  ? WBC 7.8 12/31/2021  ? HGB 12.8 12/31/2021  ? HCT 38.7 12/31/2021  ? MCV 86 12/31/2021  ? PLT 318 12/31/2021  ? ?Lab Results  ?Component Value Date  ? NA 140 12/31/2021  ? K 4.6 12/31/2021  ? CO2 25 12/31/2021  ? GLUCOSE 174 (H) 12/31/2021  ? BUN 15 12/31/2021  ? CREATININE 0.64 12/31/2021  ? BILITOT 0.3 08/04/2021  ? ALKPHOS 79 09/17/2018  ? AST 15 08/04/2021  ? ALT 17 08/04/2021  ? PROT 7.1 08/04/2021  ? ALBUMIN 4.3 09/17/2018  ? CALCIUM 9.5 12/31/2021  ? ANIONGAP 10 09/17/2018  ? EGFR 102 12/31/2021  ? ?Lab Results  ?Component Value Date  ? CHOL 142 12/31/2021   ? ?Lab Results  ?Component Value Date  ? HDL 46 12/31/2021  ? ?Lab Results  ?Component Value Date  ? LLiborio Negron Torres76 12/31/2021  ? ?Lab Results  ?Component Value Date  ? TRIG 108 12/31/2021  ? ?Lab Results  ?Component

## 2022-01-22 NOTE — Assessment & Plan Note (Addendum)
BP 179/111 in the office today. ?Patient denies headache or dizziness. ?Encouraged patient to reduce salt intake and consume heart healthy diet. ?Continue lisinopril 40 mg. ?Started patient on amlodipine 5 mg once a day.  ? ?

## 2022-01-24 NOTE — Assessment & Plan Note (Signed)
Patient BS 171 in the office today. ?Advised pt to check the BS regularly, make a log and bring to next appointment. ?Encouraged  pt to consume diet  including fruits, vegetables, whole grains, complex carbohydrates and proteins.  ?Started on Semglee 40 units.  ?Will continue to monitor. ? ?

## 2022-01-24 NOTE — Assessment & Plan Note (Signed)
Started on benzonatate 100 mg. ?

## 2022-01-24 NOTE — Assessment & Plan Note (Signed)
BMI 43.30 ?Advised pt to lose weight. ?Advised patient to avoid trans fat, fatty and fried food. ?Follow a regular physical activity schedule. ?Went over the risk of chronic diseases with increased weight.   ? ? ? ?

## 2022-01-27 ENCOUNTER — Other Ambulatory Visit: Payer: Self-pay

## 2022-01-27 ENCOUNTER — Other Ambulatory Visit: Payer: Self-pay | Admitting: Internal Medicine

## 2022-01-29 ENCOUNTER — Other Ambulatory Visit: Payer: Self-pay

## 2022-02-01 ENCOUNTER — Other Ambulatory Visit: Payer: Self-pay

## 2022-02-02 ENCOUNTER — Other Ambulatory Visit: Payer: Self-pay

## 2022-02-02 MED ORDER — GLIPIZIDE 10 MG PO TABS
ORAL_TABLET | Freq: Two times a day (BID) | ORAL | 5 refills | Status: DC
  Filled 2022-02-02: qty 180, 90d supply, fill #0
  Filled 2022-05-06: qty 180, 90d supply, fill #1
  Filled 2022-08-03: qty 180, 90d supply, fill #2
  Filled 2022-11-08: qty 180, 90d supply, fill #3

## 2022-02-08 ENCOUNTER — Other Ambulatory Visit: Payer: Self-pay

## 2022-02-11 ENCOUNTER — Ambulatory Visit: Payer: 59 | Admitting: Dermatology

## 2022-03-09 ENCOUNTER — Other Ambulatory Visit: Payer: Self-pay | Admitting: Nurse Practitioner

## 2022-03-09 ENCOUNTER — Other Ambulatory Visit: Payer: Self-pay

## 2022-03-10 ENCOUNTER — Other Ambulatory Visit: Payer: Self-pay

## 2022-03-10 MED FILL — Insulin Glargine-yfgn Soln Pen-Injector 100 Unit/ML: SUBCUTANEOUS | 37 days supply | Qty: 15 | Fill #0 | Status: AC

## 2022-03-11 ENCOUNTER — Encounter: Payer: Self-pay | Admitting: Dermatology

## 2022-03-11 ENCOUNTER — Ambulatory Visit: Payer: 59 | Admitting: Dermatology

## 2022-03-11 ENCOUNTER — Other Ambulatory Visit (HOSPITAL_COMMUNITY): Payer: Self-pay

## 2022-03-11 ENCOUNTER — Other Ambulatory Visit: Payer: Self-pay

## 2022-03-11 DIAGNOSIS — L304 Erythema intertrigo: Secondary | ICD-10-CM | POA: Diagnosis not present

## 2022-03-11 DIAGNOSIS — L810 Postinflammatory hyperpigmentation: Secondary | ICD-10-CM | POA: Diagnosis not present

## 2022-03-11 MED ORDER — MOMETASONE FUROATE 0.1 % EX CREA
TOPICAL_CREAM | CUTANEOUS | 6 refills | Status: DC
  Filled 2022-05-21: qty 45, 30d supply, fill #0
  Filled 2022-07-29: qty 45, 30d supply, fill #1
  Filled 2022-10-05: qty 45, 30d supply, fill #2

## 2022-03-11 MED ORDER — KETOCONAZOLE 2 % EX CREA
TOPICAL_CREAM | CUTANEOUS | 6 refills | Status: DC
  Filled 2022-03-11: qty 60, 30d supply, fill #0
  Filled 2022-04-16: qty 60, 30d supply, fill #1
  Filled 2022-05-21: qty 60, 30d supply, fill #2
  Filled 2022-06-23: qty 60, 30d supply, fill #3
  Filled 2022-07-29: qty 60, 30d supply, fill #4
  Filled 2022-08-27: qty 60, 30d supply, fill #5
  Filled 2022-09-23: qty 60, 30d supply, fill #6

## 2022-03-11 NOTE — Patient Instructions (Addendum)
Start Zeasorb AF powder every morning.  Continue Mometasone cream at bedtime MWF. Use Ketoconazole cream 7 days per week at bedtime.    Instructions for [facility] Medications  One or more of your medications was sent to the [facility] mail order compounding pharmacy. You will receive an email from them and can purchase the medicine through that link. It will then be mailed to your home at the address you confirmed. If for any reason you do not receive an email from them, please check your spam folder. If you still do not find the email, please let us know. [facility] phone number is [phone].     Due to recent changes in healthcare laws, you may see results of your pathology and/or laboratory studies on MyChart before the doctors have had a chance to review them. We understand that in some cases there may be results that are confusing or concerning to you. Please understand that not all results are received at the same time and often the doctors may need to interpret multiple results in order to provide you with the best plan of care or course of treatment. Therefore, we ask that you please give [place] 2 business days to thoroughly review all your results before contacting the office for clarification. Should we see a critical lab result, you will be contacted sooner.   If You Need Anything After Your Visit  If you have any questions or concerns for your doctor, please call our main line at [phone] and press option 4 to reach your doctor's medical assistant. If no one answers, please leave a voicemail as directed and we will return your call as soon as possible. Messages left after 4 pm will be answered the following business day.   You may also send [place] a message via [place]. We typically respond to MyChart messages within 1-2 business days.  For prescription refills, please ask your pharmacy to contact our office. Our fax number is [phone].  If you have an urgent  issue when the clinic is closed that cannot wait until the next business day, you can page your doctor at the number below.    Please note that while we do our best to be available for urgent issues outside of office hours, we are not available 24/7.   If you have an urgent issue and are unable to reach [place], you may choose to seek medical care at your doctor's office, retail clinic, urgent care center, or emergency room.  If you have a medical emergency, please immediately call 911 or go to the emergency department.  Pager Numbers  - Dr. Nehemiah Massed: [phone]  - Dr. Laurence Ferrari: [phone]  - Dr. Nicole Kindred: [phone]  In the event of inclement weather, please call our main line at [phone] for an update on the status of any delays or closures.  Dermatology Medication Tips: Please keep the boxes that topical medications come in in order to help keep track of the instructions about where and how to use these. Pharmacies typically print the medication instructions only on the boxes and not directly on the medication tubes.   If your medication is too expensive, please contact our office at [phone] option 4 or send [place] a message through [place].   We are unable to tell what your co-pay for medications will be in advance as this is different depending on your insurance coverage. However, we may be able to find a substitute medication at lower cost or fill out paperwork to  get insurance to cover a needed medication.   If a prior authorization is required to get your medication covered by your insurance company, please allow [place] 1-2 business days to complete this process.  Drug prices often vary depending on where the prescription is filled and some pharmacies may offer cheaper prices.  The website www.goodrx.com contains coupons for medications through different pharmacies. The prices here do not account for what the cost may be with help from insurance (it may be cheaper with your  insurance), but the website can give you the price if you did not use any insurance.  - You can print the associated coupon and take it with your prescription to the pharmacy.  - You may also stop by our office during regular business hours and pick up a GoodRx coupon card.  - If you need your prescription sent electronically to a different pharmacy, notify our office through [facility] or by phone at [phone] option 4.     Si Usted Necesita Algo Despus de Su Visita  Tambin puede enviarnos un mensaje a travs de Pharmacist, community. Por lo general respondemos a los mensajes de MyChart en el transcurso de 1 a 2 das hbiles.  Para renovar recetas, por favor pida a su farmacia que se ponga en contacto con nuestra oficina. Harland Dingwall de fax es [place] [phone].  Si tiene un asunto urgente cuando la clnica est cerrada y que no puede esperar hasta el siguiente da hbil, puede llamar/localizar a su doctor(a) al nmero que aparece a continuacin.   Por favor, tenga en cuenta que aunque hacemos todo lo posible para estar disponibles para asuntos urgentes fuera del horario de [place], no estamos disponibles las 24 horas del da, los 7 das de la [place].   Si tiene un problema urgente y no puede comunicarse con nosotros, puede optar por buscar atencin mdica  en el consultorio de su doctor(a), en una clnica privada, en un centro de atencin urgente o en una sala de emergencias.  Si tiene Engineering geologist, por favor llame inmediatamente al 911 o vaya a la sala de emergencias.  Nmeros de bper  - Dr. Nehemiah Massed: [phone]  - Dra. Moye: [phone]  - Dra. Nicole Kindred: [phone]  En caso de inclemencias del [place], por favor llame a Johnsie Kindred principal al [phone] para una actualizacin sobre el [place] de cualquier retraso o cierre.  Consejos para la medicacin en dermatologa: Por favor, guarde las cajas en las que vienen los medicamentos de uso tpico para ayudarle a  seguir las instrucciones sobre dnde y cmo usarlos. Las farmacias generalmente imprimen las instrucciones del medicamento slo en las cajas y no directamente en los tubos del [place].   Si su medicamento es muy caro, por favor, pngase en contacto con Zigmund Daniel llamando al [phone] y presione la opcin 4 o envenos un mensaje a travs de Pharmacist, community.   No podemos decirle cul ser su copago por los medicamentos por adelantado ya que esto es diferente dependiendo de la cobertura de su seguro. Sin embargo, es posible que podamos encontrar un medicamento sustituto a Electrical engineer un formulario para que el seguro cubra el medicamento que se considera necesario.   Si se requiere una autorizacin previa para que su compaa de seguros [place] su medicamento, por favor permtanos de 1 a 2 das hbiles para completar este proceso.  Los precios de los medicamentos varan con frecuencia dependiendo del Environmental consultant de dnde se surte la receta y Environmental health practitioner pueden  ofrecer precios ms baratos.  El sitio web www.goodrx.com tiene cupones para medicamentos de Airline pilot. Los precios aqu no tienen en cuenta lo que podra costar con la ayuda del seguro (puede ser ms barato con su seguro), pero el sitio web puede darle el precio si no utiliz Research scientist (physical sciences).  - Puede imprimir el cupn correspondiente y llevarlo con su receta a la farmacia.  - Tambin puede pasar por nuestra oficina durante el horario de atencin regular y Charity fundraiser una tarjeta de cupones de GoodRx.  - Si necesita que su receta se enve electrnicamente a una farmacia diferente, informe a nuestra oficina a travs de MyChart de [place] o por telfono llamando al [phone] y presione la opcin 4.       Intertrigo Intertrigo is skin irritation (inflammation) that happens in warm, moist areas of the body. The irritation can cause a rash and make skin raw and itchy. The rash is usually pink or red. It happens mostly between  folds of skin or where skin rubs together, such as: Between the toes. In the armpits. In the groin area. Under the belly. Under the breasts. Around the butt area. This condition is not passed from person to person (is not contagious). What are the causes? Heat, moisture, rubbing, and not enough air movement. The condition can be made worse by: Sweat. Bacteria. A fungus, such as yeast. What increases the risk? Moisture in your skin folds. You are more likely to develop this condition if you: Have diabetes. Are overweight. Are not able to move around. Live in a warm and moist climate. Wear splints, braces, or other medical devices. Are not able to control your pee (urine) or poop (stool). What are the signs or symptoms? A pink or red skin rash in the skin fold or near the skin fold. Raw or scaly skin. Itching. A burning feeling. Bleeding. Leaking fluid. A bad smell. How is this treated? Cleaning and drying your skin. Taking an antibiotic medicine or using an antibiotic skin cream for a bacterial infection. Using an antifungal cream on your skin or taking pills for an infection that was caused by a fungus, such as yeast. Using a steroid ointment to stop the itching and irritation. Separating the skin fold with a clean cotton cloth to absorb moisture and allow air to flow into the area. Follow these instructions at home: Keep the affected area clean and dry. Do not scratch your skin. Stay cool as much as you can. Use an air conditioner or a fan, if you have one. Apply over-the-counter and prescription medicines only as told by your doctor. If you were prescribed an antibiotic medicine, use it as told by your doctor. Do not stop using the antibiotic even if your condition starts to get better. Keep all follow-up visits as told by your doctor. This is important. How is this prevented?  Stay at a healthy weight. Take care of your feet. This is very important if you have  diabetes. You should: Wear shoes that fit well. Keep your feet dry. Wear clean cotton or wool socks. Protect the skin in your groin and butt area as told by your doctor. To do this: Follow a regular cleaning routine. Use creams, powders, or ointments that protect your skin. Change protection pads often. Do not wear tight clothes. Wear clothes that: Are loose. Take moisture away from your body. Are made of cotton. Wear a bra that gives good support, if needed. Shower and dry yourself well after being  active. Use a hair dryer on a cool setting to dry between skin folds. Keep your blood sugar under control if you have diabetes. Contact a doctor if: Your symptoms do not get better with treatment. Your symptoms get worse or they spread. You notice more redness and warmth. You have a fever. Summary Intertrigo is skin irritation that occurs when folds of skin rub together. This condition is caused by heat, moisture, and rubbing. This condition may be treated by cleaning and drying your skin and with medicines. Apply over-the-counter and prescription medicines only as told by your doctor. Keep all follow-up visits as told by your doctor. This is important. This information is not intended to replace advice given to you by your health care provider. Make sure you discuss any questions you have with your health care provider. Document Revised: 06/29/2021 Document Reviewed: 06/29/2021 Elsevier Patient Education  [place].

## 2022-03-11 NOTE — Progress Notes (Unsigned)
   Follow-Up Visit   Subjective  Tamara Wells is a 61 y.o. female who presents for the following: Rash (4 month recheck. Intertrigo. Breast folds. Using Mometasone cr QHS MWF, Ketoconazole cr qd 7 days per week in am. Creams help some but rash is still there. Mometasone burns when applied. Still itching).  The following portions of the chart were reviewed this encounter and updated as appropriate:  Tobacco  Allergies  Meds  Problems  Med Hx  Surg Hx  Fam Hx     Review of Systems: No other skin or systemic complaints except as noted in HPI or Assessment and Plan.  Objective  Well appearing patient in no apparent distress; mood and affect are within normal limits.  A focused examination was performed including face, chest. Relevant physical exam findings are noted in the Assessment and Plan.  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 Condition is symptomatic / bothersome to patient. Not to goal.  Discussed adding [facility] Itching cream. Okay to hold on prescription for now to see if new regimen works.  Start Amitriptyline: 5%, Gabapentin: 10%, Lidocaine: 5% cream twice daily as needed for itching.   Start Zeasorb AF powder every morning.   Continue Mometasone cream at bedtime MWF,  Ketoconazole cream 7 days per week at bedtime.  Related Medications ketoconazole (NIZORAL) 2 % cream Apply to affected skin 7 days a week  mometasone (ELOCON) 0.1 % cream Apply to affected skin Monday, Wednesday, Friday at bedtime  Return in about 8 months (around 11/11/2022) for Intertrigo recheck.  I, Emelia Salisbury, CMA, am acting as scribe for Sarina Ser, MD. Documentation: I have reviewed the above documentation for accuracy and completeness,  and I agree with the above.  Sarina Ser, MD

## 2022-03-16 ENCOUNTER — Encounter: Payer: Self-pay | Admitting: Dermatology

## 2022-03-19 ENCOUNTER — Other Ambulatory Visit: Payer: Self-pay

## 2022-03-19 ENCOUNTER — Encounter: Payer: Self-pay | Admitting: Nurse Practitioner

## 2022-03-19 ENCOUNTER — Ambulatory Visit (INDEPENDENT_AMBULATORY_CARE_PROVIDER_SITE_OTHER): Payer: 59 | Admitting: Nurse Practitioner

## 2022-03-19 VITALS — BP 150/95 | HR 99 | Ht 59.0 in | Wt 211.5 lb

## 2022-03-19 DIAGNOSIS — Z794 Long term (current) use of insulin: Secondary | ICD-10-CM | POA: Diagnosis not present

## 2022-03-19 DIAGNOSIS — Z0000 Encounter for general adult medical examination without abnormal findings: Secondary | ICD-10-CM

## 2022-03-19 DIAGNOSIS — Z1231 Encounter for screening mammogram for malignant neoplasm of breast: Secondary | ICD-10-CM

## 2022-03-19 DIAGNOSIS — E119 Type 2 diabetes mellitus without complications: Secondary | ICD-10-CM | POA: Diagnosis not present

## 2022-03-19 DIAGNOSIS — I10 Essential (primary) hypertension: Secondary | ICD-10-CM | POA: Diagnosis not present

## 2022-03-19 LAB — GLUCOSE, POCT (MANUAL RESULT ENTRY): POC Glucose: 178 mg/dl — AB (ref 70–99)

## 2022-03-19 MED ORDER — INSULIN GLARGINE-YFGN 100 UNIT/ML ~~LOC~~ SOPN
PEN_INJECTOR | SUBCUTANEOUS | 5 refills | Status: DC
  Filled 2022-03-19: qty 3, fill #0
  Filled 2022-04-16: qty 15, 30d supply, fill #0
  Filled 2022-05-21: qty 15, 37d supply, fill #1
  Filled 2022-06-29: qty 15, 37d supply, fill #2
  Filled 2022-08-09: qty 15, 37d supply, fill #3
  Filled 2022-09-15: qty 15, 37d supply, fill #4
  Filled 2022-10-27: qty 15, 37d supply, fill #5

## 2022-03-19 MED ORDER — GLUCOSE BLOOD VI STRP
ORAL_STRIP | 0 refills | Status: AC
Start: 2022-03-19 — End: ?
  Filled 2022-03-19: qty 100, 25d supply, fill #0

## 2022-03-19 MED ORDER — BLOOD GLUCOSE METER KIT
PACK | 0 refills | Status: AC
  Filled 2022-03-19 (×2): qty 1, 1d supply, fill #0

## 2022-03-19 MED ORDER — AMLODIPINE BESYLATE 10 MG PO TABS
10.0000 mg | ORAL_TABLET | Freq: Every day | ORAL | 2 refills | Status: DC
  Filled 2022-03-19: qty 90, 90d supply, fill #0

## 2022-03-19 MED ORDER — FREESTYLE LANCETS MISC
0 refills | Status: AC
  Filled 2022-03-19: qty 100, 25d supply, fill #0

## 2022-03-19 NOTE — Assessment & Plan Note (Signed)
Patient blood sugar 178 in the office today. Patient said her blood sugar readings at home has been improved.   Her fasting blood sugar at home around 140s. Continue take current medication.

## 2022-03-20 LAB — MICROALBUMIN, URINE: Microalb, Ur: 4.6 mg/dL

## 2022-04-07 ENCOUNTER — Other Ambulatory Visit: Payer: Self-pay

## 2022-04-16 ENCOUNTER — Other Ambulatory Visit: Payer: Self-pay

## 2022-04-19 ENCOUNTER — Other Ambulatory Visit: Payer: Self-pay

## 2022-05-06 ENCOUNTER — Other Ambulatory Visit: Payer: Self-pay

## 2022-05-06 MED ORDER — OMRON 3 SERIES BP MONITOR DEVI
0 refills | Status: AC
  Filled 2022-05-06: qty 1, 1d supply, fill #0

## 2022-05-19 ENCOUNTER — Other Ambulatory Visit: Payer: Self-pay | Admitting: Internal Medicine

## 2022-05-20 ENCOUNTER — Other Ambulatory Visit: Payer: Self-pay

## 2022-05-20 MED ORDER — METFORMIN HCL 1000 MG PO TABS
ORAL_TABLET | Freq: Two times a day (BID) | ORAL | 2 refills | Status: DC
  Filled 2022-05-20: qty 180, 90d supply, fill #0
  Filled 2022-08-15: qty 180, 90d supply, fill #1
  Filled 2022-11-18: qty 180, 90d supply, fill #2

## 2022-05-21 ENCOUNTER — Other Ambulatory Visit: Payer: Self-pay

## 2022-06-21 ENCOUNTER — Ambulatory Visit: Payer: 59 | Admitting: Internal Medicine

## 2022-06-23 ENCOUNTER — Other Ambulatory Visit: Payer: Self-pay | Admitting: Internal Medicine

## 2022-06-23 ENCOUNTER — Other Ambulatory Visit: Payer: Self-pay

## 2022-06-23 MED ORDER — FUROSEMIDE 20 MG PO TABS
20.0000 mg | ORAL_TABLET | ORAL | 3 refills | Status: DC
  Filled 2022-06-23: qty 30, 60d supply, fill #0
  Filled 2022-08-27: qty 30, 60d supply, fill #1
  Filled 2022-11-08: qty 30, 60d supply, fill #2
  Filled 2023-01-09: qty 30, 60d supply, fill #3

## 2022-06-24 ENCOUNTER — Other Ambulatory Visit: Payer: Self-pay

## 2022-06-25 ENCOUNTER — Ambulatory Visit: Payer: 59 | Admitting: Nurse Practitioner

## 2022-06-25 ENCOUNTER — Other Ambulatory Visit: Payer: Self-pay

## 2022-06-25 VITALS — BP 128/82 | HR 104 | Ht 59.0 in | Wt 212.0 lb

## 2022-06-25 DIAGNOSIS — M25474 Effusion, right foot: Secondary | ICD-10-CM | POA: Diagnosis not present

## 2022-06-25 DIAGNOSIS — E119 Type 2 diabetes mellitus without complications: Secondary | ICD-10-CM

## 2022-06-25 DIAGNOSIS — I10 Essential (primary) hypertension: Secondary | ICD-10-CM

## 2022-06-25 DIAGNOSIS — M25472 Effusion, left ankle: Secondary | ICD-10-CM

## 2022-06-25 DIAGNOSIS — Z794 Long term (current) use of insulin: Secondary | ICD-10-CM | POA: Diagnosis not present

## 2022-06-25 DIAGNOSIS — M25471 Effusion, right ankle: Secondary | ICD-10-CM

## 2022-06-25 DIAGNOSIS — M25475 Effusion, left foot: Secondary | ICD-10-CM

## 2022-06-25 LAB — GLUCOSE, POCT (MANUAL RESULT ENTRY): POC Glucose: 153 mg/dL — AB (ref 70–99)

## 2022-06-25 MED ORDER — TIRZEPATIDE 2.5 MG/0.5ML ~~LOC~~ SOAJ
2.5000 mg | SUBCUTANEOUS | 4 refills | Status: DC
  Filled 2022-06-25: qty 2, 28d supply, fill #0
  Filled 2022-07-19: qty 2, 28d supply, fill #1
  Filled 2022-08-16: qty 2, 28d supply, fill #2
  Filled 2022-09-08: qty 2, 28d supply, fill #3
  Filled 2022-10-05: qty 2, 28d supply, fill #4

## 2022-06-25 MED ORDER — ALBUTEROL SULFATE HFA 108 (90 BASE) MCG/ACT IN AERS
INHALATION_SPRAY | RESPIRATORY_TRACT | 0 refills | Status: DC
  Filled 2022-06-25: qty 6.7, 16d supply, fill #0

## 2022-06-25 MED ORDER — VALSARTAN-HYDROCHLOROTHIAZIDE 80-12.5 MG PO TABS
1.0000 | ORAL_TABLET | Freq: Every day | ORAL | 3 refills | Status: DC
  Filled 2022-06-25: qty 90, 90d supply, fill #0
  Filled 2022-09-23: qty 90, 90d supply, fill #1
  Filled 2022-12-05: qty 90, 90d supply, fill #2
  Filled 2023-03-22: qty 90, 90d supply, fill #3

## 2022-06-25 NOTE — Progress Notes (Unsigned)
Established Patient Office Visit  Subjective:  Patient ID: Tamara Wells, female    DOB: 1961/05/25  Age: 61 y.o. MRN: [id number]  CC:  Chief Complaint  Patient presents with   Diabetes     HPI  Tamara Wells presents for: No thyroid issues.   HPI   Past Medical History:  Diagnosis Date   Diabetes mellitus without complication ([place])    Dx 2006   Hyperlipidemia    Hypertension    Sleep apnea    Vertigo    2002    Past Surgical History:  Procedure Laterality Date   ABDOMINAL HYSTERECTOMY     2000   CARPAL TUNNEL RELEASE Left    CARPAL TUNNEL RELEASE Right 05/28/2019   Procedure: CARPAL TUNNEL RELEASE;  Surgeon: Earnestine Leys, MD;  Location: [facility] OR[facility];  Service: Orthopedics;  Laterality: Right;   COLONOSCOPY WITH PROPOFOL N/A 03/21/2020   Procedure: COLONOSCOPY WITH PROPOFOL;  Surgeon: Virgel Manifold, MD;  Location: [facility];  Service: Endoscopy;  Laterality: N/A;   TONSILLECTOMY      Family History  Problem Relation Age of Onset   Diabetes Mother    Diabetes Father    Diabetes Maternal Grandmother    Breast cancer Neg Hx     Social History   Socioeconomic History   Marital status: Married    Spouse name: Not on file   Number of children: Not on file   Years of education: Not on file   Highest education level: Not on file  Occupational History   Not on file  Tobacco Use   Smoking status: Former    Types: Cigarettes    Quit date: 09/27/2004    Years since quitting: 17.7   Smokeless tobacco: Never  Vaping Use   Vaping Use: Never used  Substance and Sexual Activity   Alcohol use: No   Drug use: No   Sexual activity: Not Currently    Birth control/protection: Surgical  Other Topics Concern   Not on file  Social History Narrative   Not on file   Social Determinants of Health   Financial Resource Strain: Not on file  Food Insecurity: Not on file  Transportation Needs: Not on file  Physical Activity: Not on file  Stress: Not on  file  Social Connections: Not on file  Intimate Partner Violence: Not on file     Outpatient Medications Prior to Visit  Medication Sig Dispense Refill   acetaminophen (TYLENOL) 500 MG tablet Take 1,000 mg by mouth every 6 (six) hours as needed for moderate pain or headache.      albuterol (VENTOLIN HFA) 108 (90 Base) MCG/ACT inhaler Inhale 2 puffs into the lungs every 4 hours as needed for wheezing. 6.7 g 0   amLODipine (NORVASC) 10 MG tablet Take 1 tablet (10 mg total) by mouth daily. 90 tablet 2   aspirin EC 81 MG tablet Take 81 mg by mouth daily.     Black Cohosh 40 MG CAPS Take 40 mg by mouth 2 (two) times daily.     blood glucose meter kit and supplies Dispense based on patient and insurance preference. Use up to four times daily as directed. (FOR ICD-10 E10.9, E11.9). 1 each 0   Blood Pressure Monitoring (OMRON 3 SERIES BP MONITOR) DEVI use to check blood pressure 1 each 0   diphenhydrAMINE (BENADRYL) 25 MG tablet Take 25 mg by mouth daily as needed for allergies.     fluticasone (FLONASE) 50 MCG/ACT nasal spray  Place 2 sprays into both nostrils daily. 16 g 6   furosemide (LASIX) 20 MG tablet Take 1 tablet (20 mg total) by mouth every other day. 30 tablet 3   gabapentin (NEURONTIN) 100 MG capsule Take 1 capsule (100 mg total) by mouth 3 (three) times daily. 90 capsule 3   glipiZIDE (GLUCOTROL) 10 MG tablet TAKE 1 TABLET BY MOUTH TWICE DAILY 180 tablet 5   glucose blood test strip Test as directed up to 4 times a day 100 each 0   insulin glargine-yfgn (SEMGLEE, YFGN,) 100 UNIT/ML Pen Inject 40 Units into the skin daily. 15 mL 5   ketoconazole (NIZORAL) 2 % cream Apply to affected skin 7 days a week 60 g 6   Lancets (FREESTYLE) lancets Test up to 4 times a day as directed 100 each 0   latanoprost (XALATAN) 0.005 % ophthalmic solution instill 1 drop into affected eye(s) by ophthalmic route once daily in the evening 5 mL 1   lisinopril (ZESTRIL) 40 MG tablet Take 1 tablet (40 mg total) by  mouth daily. 90 tablet 3   meclizine (ANTIVERT) 25 MG tablet Take 25 mg by mouth 3 (three) times daily as needed for dizziness.      meloxicam (MOBIC) 15 MG tablet Take 1 tablet every day by oral route with meals. 30 tablet 2   metFORMIN (GLUCOPHAGE) 1000 MG tablet TAKE 1 TABLET BY MOUTH TWICE DAILY 180 tablet 2   mometasone (ELOCON) 0.1 % cream Apply to affected skin Monday, Wednesday, and Friday at bedtime 45 g 6   Multiple Vitamins-Minerals (CENTRAL-VITE PO) Take 1 tablet by mouth daily.     rosuvastatin (CRESTOR) 10 MG tablet Take 1 tablet (10 mg total) by mouth daily. 90 tablet 3   No facility-administered medications prior to visit.    Allergies  Allergen Reactions   No Known Allergies     ROS Review of Systems  Constitutional: Negative.   HENT: Negative.    Eyes: Negative.   Respiratory:  Negative for chest tightness and shortness of breath.   Cardiovascular:  Negative for chest pain and palpitations.       Ankle swelling billaterally  Gastrointestinal: Negative.   Genitourinary: Negative.   Musculoskeletal: Negative.   Skin: Negative.   Neurological:  Positive for headaches. Negative for dizziness and facial asymmetry.  Psychiatric/Behavioral:  Negative for agitation, behavioral problems, confusion and decreased concentration.       Objective:    Physical Exam Neurological:     Mental Status: She is alert.     There were no vitals taken for this visit. Wt Readings from Last 3 Encounters:  03/19/22 211 lb 8 oz (95.9 kg)  01/22/22 214 lb 6.4 oz (97.3 kg)  01/08/22 212 lb 6.4 oz (96.3 kg)     Health Maintenance  Topic Date Due   FOOT EXAM  Never done   Diabetic kidney evaluation - Urine ACR  Never done   PAP SMEAR-Modifier  Never done   COLONOSCOPY (Pts 45-28yr Insurance coverage will need to be confirmed)  03/21/2021   Zoster Vaccines- Shingrix (1 of 2) 09/24/2022 (Originally 08/05/2012)   OPHTHALMOLOGY EXAM  06/30/2022   HEMOGLOBIN A1C  07/02/2022    Diabetic kidney evaluation - GFR measurement  01/01/2023   MAMMOGRAM  12/27/2023   TETANUS/TDAP  09/23/2029   INFLUENZA VACCINE  Completed   COVID-19 Vaccine  Completed   Hepatitis C Screening  Completed   HIV Screening  Completed   HPV VACCINES  Aged Out  There are no preventive care reminders to display for this patient.  Lab Results  Component Value Date   TSH 1.580 12/31/2021   Lab Results  Component Value Date   WBC 7.8 12/31/2021   HGB 12.8 12/31/2021   HCT 38.7 12/31/2021   MCV 86 12/31/2021   PLT 318 12/31/2021   Lab Results  Component Value Date   NA 140 12/31/2021   K 4.6 12/31/2021   CO2 25 12/31/2021   GLUCOSE 174 (H) 12/31/2021   BUN 15 12/31/2021   CREATININE 0.64 12/31/2021   BILITOT 0.3 08/04/2021   ALKPHOS 79 09/17/2018   AST 15 08/04/2021   ALT 17 08/04/2021   PROT 7.1 08/04/2021   ALBUMIN 4.3 09/17/2018   CALCIUM 9.5 12/31/2021   ANIONGAP 10 09/17/2018   EGFR 102 12/31/2021   Lab Results  Component Value Date   CHOL 142 12/31/2021   Lab Results  Component Value Date   HDL 46 12/31/2021   Lab Results  Component Value Date   LDLCALC 76 12/31/2021   Lab Results  Component Value Date   TRIG 108 12/31/2021   Lab Results  Component Value Date   CHOLHDL 3.1 12/31/2021   Lab Results  Component Value Date   HGBA1C 10.2 (H) 12/31/2021      Assessment & Plan:   Problem List Items Addressed This Visit       Endocrine   Type 2 diabetes mellitus without complication, with long-term current use of insulin ([place]) - Primary   Relevant Orders   Microalbumin, urine   POCT glucose (manual entry) (Completed)     No orders of the defined types were placed in this encounter.    Follow-up: No follow-ups on file.    Theresia Lo, NP

## 2022-06-26 ENCOUNTER — Encounter: Payer: Self-pay | Admitting: Nurse Practitioner

## 2022-06-26 DIAGNOSIS — M25471 Effusion, right ankle: Secondary | ICD-10-CM | POA: Insufficient documentation

## 2022-06-26 DIAGNOSIS — M25475 Effusion, left foot: Secondary | ICD-10-CM | POA: Insufficient documentation

## 2022-06-26 LAB — MICROALBUMIN, URINE: Microalb, Ur: 0.7 mg/dL

## 2022-06-26 NOTE — Assessment & Plan Note (Signed)
Patient blood pressure 128/82 in the office today. Changed her medication to valsartan HCTZ 80/12.5 mg daily. Stopped lisinopril and amlodipine.

## 2022-06-26 NOTE — Assessment & Plan Note (Signed)
Advised patient to keep the feet elevated. Wear Compression stockings daily. Continue Lasix 20 mg daily

## 2022-06-26 NOTE — Assessment & Plan Note (Addendum)
Patient blood sugar 153 in the office today. Started her on Mounjaro 2.5 mg subcu injection weekly. Continue glipizide, metformin and insulin. Encourage patient watch diet and follow regular exercise routine

## 2022-06-28 ENCOUNTER — Other Ambulatory Visit: Payer: Self-pay

## 2022-06-29 ENCOUNTER — Other Ambulatory Visit: Payer: Self-pay

## 2022-07-05 ENCOUNTER — Other Ambulatory Visit: Payer: Self-pay

## 2022-07-05 DIAGNOSIS — T63421A Toxic effect of venom of ants, accidental (unintentional), initial encounter: Secondary | ICD-10-CM | POA: Diagnosis not present

## 2022-07-05 DIAGNOSIS — Z23 Encounter for immunization: Secondary | ICD-10-CM | POA: Diagnosis not present

## 2022-07-05 MED ORDER — DOXYCYCLINE HYCLATE 100 MG PO CAPS
ORAL_CAPSULE | ORAL | 0 refills | Status: DC
  Filled 2022-07-05: qty 20, 10d supply, fill #0

## 2022-07-05 MED ORDER — PREDNISONE 20 MG PO TABS
ORAL_TABLET | ORAL | 0 refills | Status: DC
  Filled 2022-07-05: qty 10, 5d supply, fill #0

## 2022-07-13 ENCOUNTER — Other Ambulatory Visit: Payer: Self-pay

## 2022-07-18 ENCOUNTER — Other Ambulatory Visit: Payer: Self-pay

## 2022-07-19 ENCOUNTER — Other Ambulatory Visit: Payer: Self-pay

## 2022-07-20 ENCOUNTER — Other Ambulatory Visit: Payer: Self-pay

## 2022-07-21 ENCOUNTER — Other Ambulatory Visit: Payer: Self-pay

## 2022-07-22 ENCOUNTER — Other Ambulatory Visit: Payer: Self-pay

## 2022-07-23 ENCOUNTER — Ambulatory Visit: Payer: 59 | Admitting: Nurse Practitioner

## 2022-07-23 ENCOUNTER — Encounter: Payer: Self-pay | Admitting: Nurse Practitioner

## 2022-07-23 VITALS — BP 125/88 | HR 90 | Ht 59.0 in | Wt 209.8 lb

## 2022-07-23 DIAGNOSIS — E119 Type 2 diabetes mellitus without complications: Secondary | ICD-10-CM | POA: Diagnosis not present

## 2022-07-23 DIAGNOSIS — Z6841 Body Mass Index (BMI) 40.0 and over, adult: Secondary | ICD-10-CM | POA: Diagnosis not present

## 2022-07-23 DIAGNOSIS — Z794 Long term (current) use of insulin: Secondary | ICD-10-CM

## 2022-07-23 DIAGNOSIS — I10 Essential (primary) hypertension: Secondary | ICD-10-CM | POA: Diagnosis not present

## 2022-07-23 LAB — POCT GLYCOSYLATED HEMOGLOBIN (HGB A1C): HbA1c POC (<> result, manual entry): 7.1 % (ref 4.0–5.6)

## 2022-07-23 NOTE — Progress Notes (Unsigned)
Established Patient Office Visit  Subjective:  Patient ID: Tamara Wells, female    DOB: 03-10-1961  Age: 60 y.o. MRN: [id number]  CC:  Chief Complaint  Patient presents with   Diabetes     HPI  Tamara Wells presents for diabetes follow up. Her previous A1c was  Diabetes Pertinent negatives for hypoglycemia include no confusion, dizziness or headaches. Pertinent negatives for diabetes include no chest pain.     Past Medical History:  Diagnosis Date   Diabetes mellitus without complication ([place])    Dx 2006   Hyperlipidemia    Hypertension    Sleep apnea    Vertigo    2002    Past Surgical History:  Procedure Laterality Date   ABDOMINAL HYSTERECTOMY     2000   CARPAL TUNNEL RELEASE Left    CARPAL TUNNEL RELEASE Right 05/28/2019   Procedure: CARPAL TUNNEL RELEASE;  Surgeon: Earnestine Leys, MD;  Location: [facility];  Service: Orthopedics;  Laterality: Right;   COLONOSCOPY WITH PROPOFOL N/A 03/21/2020   Procedure: COLONOSCOPY WITH PROPOFOL;  Surgeon: Virgel Manifold, MD;  Location: [facility];  Service: Endoscopy;  Laterality: N/A;   TONSILLECTOMY      Family History  Problem Relation Age of Onset   Diabetes Mother    Diabetes Father    Diabetes Maternal Grandmother    Breast cancer Neg Hx     Social History   Socioeconomic History   Marital status: Married    Spouse name: Not on file   Number of children: Not on file   Years of education: Not on file   Highest education level: Not on file  Occupational History   Not on file  Tobacco Use   Smoking status: Former    Types: Cigarettes    Quit date: 09/27/2004    Years since quitting: 17.8   Smokeless tobacco: Never  Vaping Use   Vaping Use: Never used  Substance and Sexual Activity   Alcohol use: No   Drug use: No   Sexual activity: Not Currently    Birth control/protection: Surgical  Other Topics Concern   Not on file  Social History Narrative   Not on file   Social Determinants of  Health   Financial Resource Strain: Not on file  Food Insecurity: Not on file  Transportation Needs: Not on file  Physical Activity: Not on file  Stress: Not on file  Social Connections: Not on file  Intimate Partner Violence: Not on file     Outpatient Medications Prior to Visit  Medication Sig Dispense Refill   acetaminophen (TYLENOL) 500 MG tablet Take 1,000 mg by mouth every 6 (six) hours as needed for moderate pain or headache.      albuterol (VENTOLIN HFA) 108 (90 Base) MCG/ACT inhaler Inhale 2 puffs into the lungs every 4 hours as needed for wheezing. 6.7 g 0   aspirin EC 81 MG tablet Take 81 mg by mouth daily.     Black Cohosh 40 MG CAPS Take 40 mg by mouth 2 (two) times daily.     blood glucose meter kit and supplies Dispense based on patient and insurance preference. Use up to four times daily as directed. (FOR ICD-10 E10.9, E11.9). 1 each 0   Blood Pressure Monitoring (OMRON 3 SERIES BP MONITOR) DEVI use to check blood pressure 1 each 0   diphenhydrAMINE (BENADRYL) 25 MG tablet Take 25 mg by mouth daily as needed for allergies.     doxycycline (VIBRAMYCIN)  100 MG capsule Take 1 capsule (100 mg total) by mouth 2 (two) times daily for 10 days 20 capsule 0   fluticasone (FLONASE) 50 MCG/ACT nasal spray Place 2 sprays into both nostrils daily. 16 g 6   furosemide (LASIX) 20 MG tablet Take 1 tablet (20 mg total) by mouth every other day. 30 tablet 3   gabapentin (NEURONTIN) 100 MG capsule Take 1 capsule (100 mg total) by mouth 3 (three) times daily. 90 capsule 3   glipiZIDE (GLUCOTROL) 10 MG tablet TAKE 1 TABLET BY MOUTH TWICE DAILY 180 tablet 5   glucose blood test strip Test as directed up to 4 times a day 100 each 0   insulin glargine-yfgn (SEMGLEE, YFGN,) 100 UNIT/ML Pen Inject 40 Units into the skin daily. 15 mL 5   ketoconazole (NIZORAL) 2 % cream Apply to affected skin 7 days a week 60 g 6   Lancets (FREESTYLE) lancets Test up to 4 times a day as directed 100 each 0    latanoprost (XALATAN) 0.005 % ophthalmic solution instill 1 drop into affected eye(s) by ophthalmic route once daily in the evening 5 mL 1   meclizine (ANTIVERT) 25 MG tablet Take 25 mg by mouth 3 (three) times daily as needed for dizziness.      metFORMIN (GLUCOPHAGE) 1000 MG tablet TAKE 1 TABLET BY MOUTH TWICE DAILY 180 tablet 2   mometasone (ELOCON) 0.1 % cream Apply to affected skin Monday, Wednesday, and Friday at bedtime 45 g 6   Multiple Vitamins-Minerals (CENTRAL-VITE PO) Take 1 tablet by mouth daily.     predniSONE (DELTASONE) 20 MG tablet Take 1 tablet (20 mg total) by mouth 2 (two) times daily for 5 days 10 tablet 0   rosuvastatin (CRESTOR) 10 MG tablet Take 1 tablet (10 mg total) by mouth daily. 90 tablet 3   tirzepatide (MOUNJARO) 2.5 MG/0.5ML Pen Inject 2.5 mg into the skin once a week. 2 mL 4   valsartan-hydrochlorothiazide (DIOVAN-HCT) 80-12.5 MG tablet Take 1 tablet by mouth daily. 90 tablet 3   No facility-administered medications prior to visit.    Allergies  Allergen Reactions   No Known Allergies     ROS Review of Systems  Constitutional: Negative.   HENT:  Negative for congestion.   Eyes: Negative.   Respiratory:  Negative for chest tightness and shortness of breath.   Cardiovascular:  Negative for chest pain.  Gastrointestinal:  Negative for abdominal distention and constipation.  Genitourinary:  Negative for difficulty urinating.  Musculoskeletal:  Negative for arthralgias.  Neurological:  Negative for dizziness, facial asymmetry and headaches.  Psychiatric/Behavioral:  Negative for agitation, behavioral problems and confusion.       Objective:    Physical Exam  BP 125/88   Pulse 90   Ht _0  (1.499 m)   Wt 209 lb 12.8 oz (95.2 kg)   BMI 42.37 kg/m  Wt Readings from Last 3 Encounters:  07/23/22 209 lb 12.8 oz (95.2 kg)  06/25/22 212 lb (96.2 kg)  03/19/22 211 lb 8 oz (95.9 kg)     Health Maintenance  Topic Date Due   FOOT EXAM  Never done    Diabetic kidney evaluation - Urine ACR  Never done   PAP SMEAR-Modifier  Never done   COLONOSCOPY (Pts 45-58yr Insurance coverage will need to be confirmed)  03/21/2021   OPHTHALMOLOGY EXAM  06/30/2022   Zoster Vaccines- Shingrix (1 of 2) 09/24/2022 (Originally 08/05/2012)   Diabetic kidney evaluation - GFR measurement  01/01/2023  HEMOGLOBIN A1C  01/22/2023   MAMMOGRAM  12/27/2023   TETANUS/TDAP  07/05/2032   INFLUENZA VACCINE  Completed   COVID-19 Vaccine  Completed   Hepatitis C Screening  Completed   HIV Screening  Completed   HPV VACCINES  Aged Out    There are no preventive care reminders to display for this patient.  Lab Results  Component Value Date   TSH 1.580 12/31/2021   Lab Results  Component Value Date   WBC 7.8 12/31/2021   HGB 12.8 12/31/2021   HCT 38.7 12/31/2021   MCV 86 12/31/2021   PLT 318 12/31/2021   Lab Results  Component Value Date   NA 140 12/31/2021   K 4.6 12/31/2021   CO2 25 12/31/2021   GLUCOSE 174 (H) 12/31/2021   BUN 15 12/31/2021   CREATININE 0.64 12/31/2021   BILITOT 0.3 08/04/2021   ALKPHOS 79 09/17/2018   AST 15 08/04/2021   ALT 17 08/04/2021   PROT 7.1 08/04/2021   ALBUMIN 4.3 09/17/2018   CALCIUM 9.5 12/31/2021   ANIONGAP 10 09/17/2018   EGFR 102 12/31/2021   Lab Results  Component Value Date   CHOL 142 12/31/2021   Lab Results  Component Value Date   HDL 46 12/31/2021   Lab Results  Component Value Date   LDLCALC 76 12/31/2021   Lab Results  Component Value Date   TRIG 108 12/31/2021   Lab Results  Component Value Date   CHOLHDL 3.1 12/31/2021   Lab Results  Component Value Date   HGBA1C 7.1 07/23/2022      Assessment & Plan:   Problem List Items Addressed This Visit       Endocrine   Type 2 diabetes mellitus without complication, with long-term current use of insulin ([place]) - Primary   Relevant Orders   POCT HgB A1C (Completed)     No orders of the defined types were placed in this  encounter.    Follow-up: No follow-ups on file.    Theresia Lo, NP

## 2022-07-26 ENCOUNTER — Encounter: Payer: Self-pay | Admitting: Nurse Practitioner

## 2022-07-26 NOTE — Assessment & Plan Note (Signed)
She has lost 3 pounds since last visit. Advised patient to continue consume healthy food and follow regular exercise routine.

## 2022-07-26 NOTE — Assessment & Plan Note (Signed)
Patient BP 25/88 in the office today Advised pt to follow a low sodium and heart healthy diet. Continue valsartan HCTZ daily

## 2022-07-26 NOTE — Assessment & Plan Note (Signed)
Patient POCT hemoglobin A1c 7.1 in the office today. Advised pt to check the BS regularly, make a log and bring to next appointment.  Advised pt to monitor diet. Advised pt to eat variety of food including fruits, vegetables, whole grains, complex carbohydrates and proteins.  Continue the current medication.

## 2022-07-27 ENCOUNTER — Other Ambulatory Visit: Payer: Self-pay

## 2022-07-29 ENCOUNTER — Other Ambulatory Visit: Payer: Self-pay

## 2022-07-29 MED ORDER — LATANOPROST 0.005 % OP SOLN
OPHTHALMIC | 3 refills | Status: AC
  Filled 2022-07-29: qty 5, 30d supply, fill #0
  Filled 2022-08-27: qty 5, 30d supply, fill #1
  Filled 2022-09-23: qty 5, 30d supply, fill #2
  Filled 2023-01-09: qty 5, 30d supply, fill #3

## 2022-08-03 ENCOUNTER — Other Ambulatory Visit: Payer: Self-pay

## 2022-08-04 ENCOUNTER — Other Ambulatory Visit: Payer: Self-pay

## 2022-08-09 ENCOUNTER — Other Ambulatory Visit: Payer: Self-pay

## 2022-08-16 ENCOUNTER — Other Ambulatory Visit: Payer: Self-pay

## 2022-08-27 ENCOUNTER — Other Ambulatory Visit: Payer: Self-pay

## 2022-09-08 ENCOUNTER — Other Ambulatory Visit: Payer: Self-pay

## 2022-10-05 ENCOUNTER — Other Ambulatory Visit: Payer: Self-pay | Admitting: Internal Medicine

## 2022-10-05 DIAGNOSIS — E119 Type 2 diabetes mellitus without complications: Secondary | ICD-10-CM

## 2022-10-06 ENCOUNTER — Other Ambulatory Visit: Payer: Self-pay

## 2022-10-06 DIAGNOSIS — H401132 Primary open-angle glaucoma, bilateral, moderate stage: Secondary | ICD-10-CM | POA: Diagnosis not present

## 2022-10-06 MED ORDER — LATANOPROST 0.005 % OP SOLN
1.0000 [drp] | Freq: Every day | OPHTHALMIC | 0 refills | Status: AC
  Filled 2022-10-06: qty 2.5, 25d supply, fill #0

## 2022-10-07 ENCOUNTER — Other Ambulatory Visit: Payer: Self-pay | Admitting: Internal Medicine

## 2022-10-07 ENCOUNTER — Other Ambulatory Visit: Payer: Self-pay

## 2022-10-07 DIAGNOSIS — E119 Type 2 diabetes mellitus without complications: Secondary | ICD-10-CM

## 2022-10-08 ENCOUNTER — Other Ambulatory Visit: Payer: Self-pay

## 2022-10-08 ENCOUNTER — Encounter: Payer: Self-pay | Admitting: Nurse Practitioner

## 2022-10-08 ENCOUNTER — Ambulatory Visit (INDEPENDENT_AMBULATORY_CARE_PROVIDER_SITE_OTHER): Payer: Commercial Managed Care - PPO | Admitting: Nurse Practitioner

## 2022-10-08 VITALS — BP 132/84 | HR 102 | Temp 98.0°F | Ht 59.0 in | Wt 215.0 lb

## 2022-10-08 DIAGNOSIS — I10 Essential (primary) hypertension: Secondary | ICD-10-CM

## 2022-10-08 DIAGNOSIS — Z794 Long term (current) use of insulin: Secondary | ICD-10-CM | POA: Diagnosis not present

## 2022-10-08 DIAGNOSIS — K120 Recurrent oral aphthae: Secondary | ICD-10-CM | POA: Diagnosis not present

## 2022-10-08 DIAGNOSIS — E119 Type 2 diabetes mellitus without complications: Secondary | ICD-10-CM | POA: Diagnosis not present

## 2022-10-08 MED ORDER — NYSTATIN 100000 UNIT/ML MT SUSP
5.0000 mL | Freq: Three times a day (TID) | OROMUCOSAL | 0 refills | Status: DC | PRN
  Filled 2022-10-08: qty 180, 12d supply, fill #0

## 2022-10-08 MED ORDER — TIRZEPATIDE 5 MG/0.5ML ~~LOC~~ SOAJ
5.0000 mg | SUBCUTANEOUS | 0 refills | Status: DC
  Filled 2022-10-08: qty 6, 84d supply, fill #0
  Filled 2022-11-01: qty 2, 28d supply, fill #0
  Filled 2022-12-02: qty 2, 28d supply, fill #1
  Filled 2022-12-28: qty 2, 28d supply, fill #2

## 2022-10-08 MED ORDER — MAGIC MOUTHWASH
5.0000 mL | Freq: Three times a day (TID) | ORAL | 0 refills | Status: DC | PRN
  Filled 2022-10-08: qty 250, 17d supply, fill #0

## 2022-10-08 NOTE — Progress Notes (Addendum)
Established Patient Office Visit  Subjective:  Patient ID: Tamara Wells, female    DOB: 02-03-1961  Age: 62 y.o. MRN: [id number]  CC:  Chief Complaint  Patient presents with   Diabetes   Hypertension     HPI  Tamara Wells presents for follow-up on diabetes and hypertension.  She is taking mounjaro, metformin, glipizide and Semglee 40 units for diabetes. Her fasting BS at home ranges from 130-140.   She is getting canker sore from 2 month intermittently and she is using warm  salt water that helps. She still has one canker sore on the left lower lip.     Diabetes Pertinent negatives for hypoglycemia include no confusion, dizziness or headaches. Pertinent negatives for diabetes include no chest pain.  Hypertension Pertinent negatives include no chest pain, headaches or shortness of breath.     Past Medical History:  Diagnosis Date   Diabetes mellitus without complication ([place])    Dx 2006   Hyperlipidemia    Hypertension    Sleep apnea    Vertigo    2002    Past Surgical History:  Procedure Laterality Date   ABDOMINAL HYSTERECTOMY     2000   CARPAL TUNNEL RELEASE Left    CARPAL TUNNEL RELEASE Right 05/28/2019   Procedure: CARPAL TUNNEL RELEASE;  Surgeon: Earnestine Leys, MD;  Location: [facility] ORS;  Service: Orthopedics;  Laterality: Right;   COLONOSCOPY WITH PROPOFOL N/A 03/21/2020   Procedure: COLONOSCOPY WITH PROPOFOL;  Surgeon: Virgel Manifold, MD;  Location: [facility] ENDOSCOPY;  Service: Endoscopy;  Laterality: N/A;   TONSILLECTOMY      Family History  Problem Relation Age of Onset   Diabetes Mother    Diabetes Father    Diabetes Maternal Grandmother    Breast cancer Neg Hx     Social History   Socioeconomic History   Marital status: Married    Spouse name: Not on file   Number of children: Not on file   Years of education: Not on file   Highest education level: Not on file  Occupational History   Not on file  Tobacco Use   Smoking status:  Former    Types: Cigarettes    Quit date: 09/27/2004    Years since quitting: 18.0   Smokeless tobacco: Never  Vaping Use   Vaping Use: Never used  Substance and Sexual Activity   Alcohol use: No   Drug use: No   Sexual activity: Not Currently    Birth control/protection: Surgical  Other Topics Concern   Not on file  Social History Narrative   Not on file   Social Determinants of Health   Financial Resource Strain: Not on file  Food Insecurity: Not on file  Transportation Needs: Not on file  Physical Activity: Not on file  Stress: Not on file  Social Connections: Not on file  Intimate Partner Violence: Not on file     Outpatient Medications Prior to Visit  Medication Sig Dispense Refill   albuterol (VENTOLIN HFA) 108 (90 Base) MCG/ACT inhaler Inhale 2 puffs into the lungs every 4 hours as needed for wheezing. 6.7 g 0   aspirin EC 81 MG tablet Take 81 mg by mouth daily.     Black Cohosh 40 MG CAPS Take 40 mg by mouth 2 (two) times daily.     blood glucose meter kit and supplies Dispense based on patient and insurance preference. Use up to four times daily as directed. (FOR ICD-10 E10.9, E11.9). 1  each 0   Blood Pressure Monitoring (OMRON 3 SERIES BP MONITOR) DEVI use to check blood pressure 1 each 0   diphenhydrAMINE (BENADRYL) 25 MG tablet Take 25 mg by mouth daily as needed for allergies.     fluticasone (FLONASE) 50 MCG/ACT nasal spray Place 2 sprays into both nostrils daily. 16 g 6   furosemide (LASIX) 20 MG tablet Take 1 tablet (20 mg total) by mouth every other day. 30 tablet 3   gabapentin (NEURONTIN) 100 MG capsule Take 1 capsule (100 mg total) by mouth 3 (three) times daily. 90 capsule 3   glipiZIDE (GLUCOTROL) 10 MG tablet TAKE 1 TABLET BY MOUTH TWICE DAILY 180 tablet 5   glucose blood test strip Test as directed up to 4 times a day 100 each 0   insulin glargine-yfgn (SEMGLEE, YFGN,) 100 UNIT/ML Pen Inject 40 Units into the skin daily. 15 mL 5   ketoconazole  (NIZORAL) 2 % cream Apply to affected skin 7 days a week 60 g 6   Lancets (FREESTYLE) lancets Test up to 4 times a day as directed 100 each 0   latanoprost (XALATAN) 0.005 % ophthalmic solution instill 1 drop into affected eye(s) once daily in the evening 5 mL 3   latanoprost (XALATAN) 0.005 % ophthalmic solution Place 1 drop into both eyes daily in the evening. 2.5 mL 0   meclizine (ANTIVERT) 25 MG tablet Take 25 mg by mouth 3 (three) times daily as needed for dizziness.      metFORMIN (GLUCOPHAGE) 1000 MG tablet TAKE 1 TABLET BY MOUTH TWICE DAILY 180 tablet 2   mometasone (ELOCON) 0.1 % cream Apply to affected skin Monday, Wednesday, and Friday at bedtime 45 g 6   Multiple Vitamins-Minerals (CENTRAL-VITE PO) Take 1 tablet by mouth daily.     rosuvastatin (CRESTOR) 10 MG tablet Take 1 tablet (10 mg total) by mouth daily. 90 tablet 3   valsartan-hydrochlorothiazide (DIOVAN-HCT) 80-12.5 MG tablet Take 1 tablet by mouth daily. 90 tablet 3   tirzepatide (MOUNJARO) 2.5 MG/0.5ML Pen Inject 2.5 mg into the skin once a week. 2 mL 4   acetaminophen (TYLENOL) 500 MG tablet Take 1,000 mg by mouth every 6 (six) hours as needed for moderate pain or headache.      doxycycline (VIBRAMYCIN) 100 MG capsule Take 1 capsule (100 mg total) by mouth 2 (two) times daily for 10 days 20 capsule 0   predniSONE (DELTASONE) 20 MG tablet Take 1 tablet (20 mg total) by mouth 2 (two) times daily for 5 days 10 tablet 0   No facility-administered medications prior to visit.    Allergies  Allergen Reactions   No Known Allergies     ROS Review of Systems  Constitutional: Negative.   HENT:  Positive for mouth sores. Negative for congestion.   Eyes: Negative.   Respiratory:  Negative for chest tightness and shortness of breath.   Cardiovascular:  Negative for chest pain.  Gastrointestinal:  Negative for abdominal distention and constipation.  Genitourinary:  Negative for difficulty urinating.  Musculoskeletal:  Negative  for arthralgias.  Neurological:  Negative for dizziness, facial asymmetry and headaches.  Psychiatric/Behavioral:  Negative for agitation, behavioral problems and confusion.       Objective:    Physical Exam Constitutional:      Appearance: Normal appearance. She is obese.  HENT:     Head: Normocephalic.     Right Ear: Tympanic membrane normal.     Left Ear: Tympanic membrane normal.  Nose: Nose normal.     Mouth/Throat:     Mouth: Mucous membranes are moist.     Pharynx: Oropharynx is clear.  Eyes:     Extraocular Movements: Extraocular movements intact.     Conjunctiva/sclera: Conjunctivae normal.     Pupils: Pupils are equal, round, and reactive to light.  Cardiovascular:     Rate and Rhythm: Normal rate and regular rhythm.     Pulses: Normal pulses.     Heart sounds: Normal heart sounds.  Pulmonary:     Effort: Pulmonary effort is normal. No respiratory distress.     Breath sounds: Normal breath sounds. No rhonchi.  Abdominal:     General: Bowel sounds are normal.     Palpations: Abdomen is soft. There is no mass.     Tenderness: There is no abdominal tenderness.     Hernia: No hernia is present.  Musculoskeletal:        General: Normal range of motion.     Cervical back: Neck supple. No tenderness.  Skin:    General: Skin is warm.     Capillary Refill: Capillary refill takes less than 2 seconds.  Neurological:     General: No focal deficit present.     Mental Status: She is alert and oriented to person, place, and time. Mental status is at baseline.  Psychiatric:        Mood and Affect: Mood normal.        Behavior: Behavior normal.        Thought Content: Thought content normal.        Judgment: Judgment normal.     BP 132/84   Pulse (!) 102   Temp 98 F (36.7 C)   Ht '4\' 11"'$  (1.499 m)   Wt 215 lb (97.5 kg)   SpO2 96%   BMI 43.42 kg/m  Wt Readings from Last 3 Encounters:  10/08/22 215 lb (97.5 kg)  07/23/22 209 lb 12.8 oz (95.2 kg)  06/25/22 212  lb (96.2 kg)     Health Maintenance  Topic Date Due   FOOT EXAM  Never done   Diabetic kidney evaluation - Urine ACR  Never done   PAP SMEAR-Modifier  Never done   Zoster Vaccines- Shingrix (1 of 2) Never done   COLONOSCOPY (Pts 45-79yr Insurance coverage will need to be confirmed)  03/21/2021   OPHTHALMOLOGY EXAM  06/30/2022   COVID-19 Vaccine (4 - 2023-24 season) 10/24/2022 (Originally 05/28/2022)   Diabetic kidney evaluation - eGFR measurement  01/01/2023   HEMOGLOBIN A1C  01/22/2023   MAMMOGRAM  12/27/2023   DTaP/Tdap/Td (3 - Td or Tdap) 07/05/2032   INFLUENZA VACCINE  Completed   Hepatitis C Screening  Completed   HIV Screening  Completed   HPV VACCINES  Aged Out    There are no preventive care reminders to display for this patient.  Lab Results  Component Value Date   TSH 1.580 12/31/2021   Lab Results  Component Value Date   WBC 7.8 12/31/2021   HGB 12.8 12/31/2021   HCT 38.7 12/31/2021   MCV 86 12/31/2021   PLT 318 12/31/2021   Lab Results  Component Value Date   NA 140 12/31/2021   K 4.6 12/31/2021   CO2 25 12/31/2021   GLUCOSE 174 (H) 12/31/2021   BUN 15 12/31/2021   CREATININE 0.64 12/31/2021   BILITOT 0.3 08/04/2021   ALKPHOS 79 09/17/2018   AST 15 08/04/2021   ALT 17 08/04/2021   PROT 7.1  08/04/2021   ALBUMIN 4.3 09/17/2018   CALCIUM 9.5 12/31/2021   ANIONGAP 10 09/17/2018   EGFR 102 12/31/2021   Lab Results  Component Value Date   CHOL 142 12/31/2021   Lab Results  Component Value Date   HDL 46 12/31/2021   Lab Results  Component Value Date   LDLCALC 76 12/31/2021   Lab Results  Component Value Date   TRIG 108 12/31/2021   Lab Results  Component Value Date   CHOLHDL 3.1 12/31/2021   Lab Results  Component Value Date   HGBA1C 7.1 07/23/2022      Assessment & Plan:   Problem List Items Addressed This Visit       Cardiovascular and Mediastinum   Hypertension - Primary    Patient BP  Vitals:   10/08/22 1010  BP:  132/84    in the office today. Advised pt to follow a low sodium and heart healthy diet. Continue valsartan HCTZ 80-12.5 mg daily.         Digestive   Canker sores oral    Started her on Magic mouthwash. Advised patient to swish and spit 5 mL 3 times a day for 10 days.        Endocrine   Type 2 diabetes mellitus without complication, with long-term current use of insulin (HCC)    Her POCT hemoglobin A1c 7.1 on 07/23/2022. Encouraged her to check the BS regularly, make a log and bring to next appointment.  Increased Mounjaro to 5 mg subcu weekly. Continue the current medication regimen.       Relevant Medications   tirzepatide (MOUNJARO) 5 MG/0.5ML Pen     Meds ordered this encounter  Medications   tirzepatide (MOUNJARO) 5 MG/0.5ML Pen    Sig: Inject 5 mg into the skin once a week.    Dispense:  6 mL    Refill:  0   DISCONTD: magic mouthwash SOLN    Sig: Take 5 mLs by mouth 3 (three) times daily as needed for mouth pain.    Dispense:  250 mL    Refill:  0   magic mouthwash (nystatin, lidocaine, diphenhydrAMINE, alum & mag hydroxide) suspension    Sig: Swish and spit 5 mLs 3 (three) times daily as needed for mouth pain.    Dispense:  180 mL    Refill:  0     Follow-up: No follow-ups on file.    Theresia Lo, NP

## 2022-10-11 ENCOUNTER — Other Ambulatory Visit: Payer: Self-pay

## 2022-10-13 DIAGNOSIS — K120 Recurrent oral aphthae: Secondary | ICD-10-CM | POA: Insufficient documentation

## 2022-10-13 NOTE — Assessment & Plan Note (Signed)
Patient BP  Vitals:   10/08/22 1010  BP: 132/84    in the office today. Advised pt to follow a low sodium and heart healthy diet. Continue valsartan HCTZ 80-12.5 mg daily.

## 2022-10-13 NOTE — Assessment & Plan Note (Signed)
Started her on Magic mouthwash. Advised patient to swish and spit 5 mL 3 times a day for 10 days.

## 2022-10-13 NOTE — Assessment & Plan Note (Addendum)
Her POCT hemoglobin A1c 7.1 on 07/23/2022. Encouraged her to check the BS regularly, make a log and bring to next appointment.  Increased Mounjaro to 5 mg subcu weekly. Continue the current medication regimen.

## 2022-10-14 ENCOUNTER — Other Ambulatory Visit: Payer: Self-pay

## 2022-10-14 ENCOUNTER — Other Ambulatory Visit: Payer: Self-pay | Admitting: Nurse Practitioner

## 2022-10-14 DIAGNOSIS — E119 Type 2 diabetes mellitus without complications: Secondary | ICD-10-CM

## 2022-10-18 ENCOUNTER — Other Ambulatory Visit: Payer: Self-pay

## 2022-10-23 ENCOUNTER — Other Ambulatory Visit: Payer: Self-pay | Admitting: Internal Medicine

## 2022-10-23 DIAGNOSIS — E119 Type 2 diabetes mellitus without complications: Secondary | ICD-10-CM

## 2022-10-25 ENCOUNTER — Other Ambulatory Visit: Payer: Self-pay

## 2022-10-25 ENCOUNTER — Other Ambulatory Visit: Payer: Self-pay | Admitting: Internal Medicine

## 2022-10-25 DIAGNOSIS — E119 Type 2 diabetes mellitus without complications: Secondary | ICD-10-CM

## 2022-10-26 ENCOUNTER — Other Ambulatory Visit: Payer: Self-pay

## 2022-10-27 ENCOUNTER — Other Ambulatory Visit: Payer: Self-pay | Admitting: Internal Medicine

## 2022-10-27 DIAGNOSIS — E119 Type 2 diabetes mellitus without complications: Secondary | ICD-10-CM

## 2022-10-28 ENCOUNTER — Telehealth: Payer: Self-pay | Admitting: Internal Medicine

## 2022-10-28 ENCOUNTER — Other Ambulatory Visit: Payer: Self-pay | Admitting: Nurse Practitioner

## 2022-10-28 ENCOUNTER — Other Ambulatory Visit: Payer: Self-pay

## 2022-10-28 DIAGNOSIS — E119 Type 2 diabetes mellitus without complications: Secondary | ICD-10-CM

## 2022-10-28 MED ORDER — GABAPENTIN 100 MG PO CAPS
100.0000 mg | ORAL_CAPSULE | Freq: Three times a day (TID) | ORAL | 3 refills | Status: DC
  Filled 2022-10-28: qty 90, 30d supply, fill #0
  Filled 2022-12-05: qty 90, 30d supply, fill #1
  Filled 2023-02-23: qty 90, 30d supply, fill #2
  Filled 2023-04-08: qty 90, 30d supply, fill #3

## 2022-10-28 MED ORDER — ROSUVASTATIN CALCIUM 10 MG PO TABS
10.0000 mg | ORAL_TABLET | Freq: Every day | ORAL | 3 refills | Status: DC
  Filled 2022-10-28: qty 90, 90d supply, fill #0
  Filled 2023-02-09: qty 90, 90d supply, fill #1
  Filled 2023-05-12: qty 90, 90d supply, fill #2
  Filled 2023-08-11: qty 90, 90d supply, fill #3

## 2022-10-28 NOTE — Telephone Encounter (Signed)
Prescription Request  10/28/2022  Is this a "Controlled Substance" medicine? Yes  LOV: 11/04/2021  What is the name of the medication or equipment? gabapentin (NEURONTIN) 100 MG capsule and rosuvastatin (CRESTOR) 10 MG tablet.  Have you contacted your pharmacy to request a refill? No   Which pharmacy would you like this sent to?  [facility] [place] [address] Phone: [phone] Fax: [phone]    Patient notified that their request is being sent to the clinical staff for review and that they should receive a response within 2 business days.   Please advise at [place]

## 2022-10-28 NOTE — Telephone Encounter (Signed)
Yes, please.

## 2022-10-29 NOTE — Telephone Encounter (Signed)
DONE

## 2022-11-01 ENCOUNTER — Other Ambulatory Visit: Payer: Self-pay

## 2022-11-08 ENCOUNTER — Other Ambulatory Visit: Payer: Self-pay | Admitting: Dermatology

## 2022-11-08 ENCOUNTER — Other Ambulatory Visit: Payer: Self-pay

## 2022-11-08 DIAGNOSIS — L304 Erythema intertrigo: Secondary | ICD-10-CM

## 2022-11-08 MED ORDER — KETOCONAZOLE 2 % EX CREA
TOPICAL_CREAM | CUTANEOUS | 3 refills | Status: DC
  Filled 2022-11-08: qty 60, 30d supply, fill #0

## 2022-11-11 ENCOUNTER — Ambulatory Visit: Payer: 59 | Admitting: Dermatology

## 2022-11-18 ENCOUNTER — Other Ambulatory Visit: Payer: Self-pay

## 2022-11-18 DIAGNOSIS — M654 Radial styloid tenosynovitis [de Quervain]: Secondary | ICD-10-CM | POA: Insufficient documentation

## 2022-11-18 DIAGNOSIS — M7710 Lateral epicondylitis, unspecified elbow: Secondary | ICD-10-CM | POA: Insufficient documentation

## 2022-11-18 DIAGNOSIS — M7711 Lateral epicondylitis, right elbow: Secondary | ICD-10-CM | POA: Diagnosis not present

## 2022-11-18 MED ORDER — MELOXICAM 15 MG PO TABS
15.0000 mg | ORAL_TABLET | Freq: Every day | ORAL | 2 refills | Status: AC
  Filled 2022-11-18: qty 90, 90d supply, fill #0
  Filled 2023-02-13: qty 90, 90d supply, fill #1
  Filled 2023-05-15: qty 90, 90d supply, fill #2

## 2022-11-25 ENCOUNTER — Ambulatory Visit (INDEPENDENT_AMBULATORY_CARE_PROVIDER_SITE_OTHER): Payer: Commercial Managed Care - PPO | Admitting: Dermatology

## 2022-11-25 ENCOUNTER — Other Ambulatory Visit: Payer: Self-pay

## 2022-11-25 VITALS — BP 159/89 | HR 114

## 2022-11-25 DIAGNOSIS — L299 Pruritus, unspecified: Secondary | ICD-10-CM | POA: Diagnosis not present

## 2022-11-25 DIAGNOSIS — L304 Erythema intertrigo: Secondary | ICD-10-CM

## 2022-11-25 DIAGNOSIS — L819 Disorder of pigmentation, unspecified: Secondary | ICD-10-CM

## 2022-11-25 MED ORDER — KETOCONAZOLE 2 % EX CREA
1.0000 | TOPICAL_CREAM | Freq: Two times a day (BID) | CUTANEOUS | 5 refills | Status: AC
  Filled 2022-11-25: qty 60, 30d supply, fill #0
  Filled 2022-12-28: qty 60, 30d supply, fill #1
  Filled 2023-01-21: qty 60, 30d supply, fill #2
  Filled 2023-02-21: qty 60, 30d supply, fill #3

## 2022-11-25 MED ORDER — MOMETASONE FUROATE 0.1 % EX CREA
1.0000 | TOPICAL_CREAM | Freq: Two times a day (BID) | CUTANEOUS | 5 refills | Status: AC
  Filled 2022-11-25: qty 45, 30d supply, fill #0
  Filled 2023-06-08: qty 45, 30d supply, fill #1

## 2022-11-25 NOTE — Patient Instructions (Addendum)
Increase Ketoconazole 2% cream to twice daily everyday. Increase Mometasone 0.1% cream to twice daily 3-4 days per week. Recommend Zeasorb AF powder daily to help prevent recurrence of rash.   Due to recent changes in healthcare laws, you may see results of your pathology and/or laboratory studies on MyChart before the doctors have had a chance to review them. We understand that in some cases there may be results that are confusing or concerning to you. Please understand that not all results are received at the same time and often the doctors may need to interpret multiple results in order to provide you with the best plan of care or course of treatment. Therefore, we ask that you please give [place] 2 business days to thoroughly review all your results before contacting the office for clarification. Should we see a critical lab result, you will be contacted sooner.   If You Need Anything After Your Visit  If you have any questions or concerns for your doctor, please call our main line at [phone] and press option 4 to reach your doctor's medical assistant. If no one answers, please leave a voicemail as directed and we will return your call as soon as possible. Messages left after 4 pm will be answered the following business day.   You may also send [place] a message via [place]. We typically respond to MyChart messages within 1-2 business days.  For prescription refills, please ask your pharmacy to contact our office. Our fax number is [phone].  If you have an urgent issue when the clinic is closed that cannot wait until the next business day, you can page your doctor at the number below.    Please note that while we do our best to be available for urgent issues outside of office hours, we are not available 24/7.   If you have an urgent issue and are unable to reach [place], you may choose to seek medical care at your doctor's office, retail clinic, urgent care center, or emergency room.  If you have a  medical emergency, please immediately call 911 or go to the emergency department.  Pager Numbers  - Dr. Nehemiah Massed: [phone]  - Dr. Laurence Ferrari: [phone]  - Dr. Nicole Kindred: [phone]  In the event of inclement weather, please call our main line at [phone] for an update on the status of any delays or closures.  Dermatology Medication Tips: Please keep the boxes that topical medications come in in order to help keep track of the instructions about where and how to use these. Pharmacies typically print the medication instructions only on the boxes and not directly on the medication tubes.   If your medication is too expensive, please contact our office at [phone] option 4 or send [place] a message through [place].   We are unable to tell what your co-pay for medications will be in advance as this is different depending on your insurance coverage. However, we may be able to find a substitute medication at lower cost or fill out paperwork to get insurance to cover a needed medication.   If a prior authorization is required to get your medication covered by your insurance company, please allow [place] 1-2 business days to complete this process.  Drug prices often vary depending on where the prescription is filled and some pharmacies may offer cheaper prices.  The website www.goodrx.com contains coupons for medications through different pharmacies. The prices here do not account for what the cost may be with help from insurance (it may  be cheaper with your insurance), but the website can give you the price if you did not use any insurance.  - You can print the associated coupon and take it with your prescription to the pharmacy.  - You may also stop by our office during regular business hours and pick up a GoodRx coupon card.  - If you need your prescription sent electronically to a different pharmacy, notify our office through [facility] or by phone at [phone] option 4.     Si  Usted Necesita Algo Despus de Su Visita  Tambin puede enviarnos un mensaje a travs de Pharmacist, community. Por lo general respondemos a los mensajes de MyChart en el transcurso de 1 a 2 das hbiles.  Para renovar recetas, por favor pida a su farmacia que se ponga en contacto con nuestra oficina. Harland Dingwall de fax es [place] [phone].  Si tiene un asunto urgente cuando la clnica est cerrada y que no puede esperar hasta el siguiente da hbil, puede llamar/localizar a su doctor(a) al nmero que aparece a continuacin.   Por favor, tenga en cuenta que aunque hacemos todo lo posible para estar disponibles para asuntos urgentes fuera del horario de [place], no estamos disponibles las 24 horas del da, los 7 das de la [place].   Si tiene un problema urgente y no puede comunicarse con nosotros, puede optar por buscar atencin mdica  en el consultorio de su doctor(a), en una clnica privada, en un centro de atencin urgente o en una sala de emergencias.  Si tiene Engineering geologist, por favor llame inmediatamente al 911 o vaya a la sala de emergencias.  Nmeros de bper  - Dr. Nehemiah Massed: [phone]  - Dra. Moye: [phone]  - Dra. Nicole Kindred: [phone]  En caso de inclemencias del [place], por favor llame a Johnsie Kindred principal al [phone] para una actualizacin sobre el [place] de cualquier retraso o cierre.  Consejos para la medicacin en dermatologa: Por favor, guarde las cajas en las que vienen los medicamentos de uso tpico para ayudarle a seguir las instrucciones sobre dnde y cmo usarlos. Las farmacias generalmente imprimen las instrucciones del medicamento slo en las cajas y no directamente en los tubos del [place].   Si su medicamento es muy caro, por favor, pngase en contacto con Zigmund Daniel llamando al [phone] y presione la opcin 4 o envenos un mensaje a travs de Pharmacist, community.   No podemos decirle cul ser su copago por los medicamentos por adelantado ya que  esto es diferente dependiendo de la cobertura de su seguro. Sin embargo, es posible que podamos encontrar un medicamento sustituto a Electrical engineer un formulario para que el seguro cubra el medicamento que se considera necesario.   Si se requiere una autorizacin previa para que su compaa de seguros [place] su medicamento, por favor permtanos de 1 a 2 das hbiles para completar este proceso.  Los precios de los medicamentos varan con frecuencia dependiendo del Environmental consultant de dnde se surte la receta y alguna farmacias pueden ofrecer precios ms baratos.  El sitio web www.goodrx.com tiene cupones para medicamentos de Airline pilot. Los precios aqu no tienen en cuenta lo que podra costar con la ayuda del seguro (puede ser ms barato con su seguro), pero el sitio web puede darle el precio si no utiliz Research scientist (physical sciences).  - Puede imprimir el cupn correspondiente y llevarlo con su receta a la farmacia.  - Tambin puede pasar por nuestra oficina durante el horario de atencin regular y  recoger una tarjeta de cupones de GoodRx.  - Si necesita que su receta se enve electrnicamente a una farmacia diferente, informe a nuestra oficina a travs de MyChart de [place] o por telfono llamando al [phone] y presione la opcin 4.

## 2022-11-25 NOTE — Progress Notes (Signed)
   Follow-Up Visit   Subjective  Tamara Wells is a 62 y.o. female who presents for the following: Intertrigo (Inframammary  - patient states that she is still itching under the breast even though she is using Ketoconazole 2% cream QD and Mometasone 0.1% cream QD 3-4 days per week. Pt felt the Skin Medicinals mix wasn't helpful and even made the itching worse).  The following portions of the chart were reviewed this encounter and updated as appropriate:   Tobacco  Allergies  Meds  Problems  Med Hx  Surg Hx  Fam Hx     Review of Systems:  No other skin or systemic complaints except as noted in HPI or Assessment and Plan.  Objective  Well appearing patient in no apparent distress; mood and affect are within normal limits.  A focused examination was performed including the face and inframammary area. Relevant physical exam findings are noted in the Assessment and Plan.  B/L inframammary fold Macular hyperpigmentation.    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 Condition is symptomatic / bothersome to patient. Not to goal. Itching is persistent and hyperpigmentation is persistent but lightening up.  Rash and redness seems mostly resolved.  Intertrigo is a chronic recurrent rash that occurs in skin fold areas that may be associated with friction; heat; moisture; yeast; fungus; and bacteria.  It is exacerbated by increased movement / activity; sweating; and higher atmospheric temperature.  Recommend Zeasorb AF powder daily to prevent recurrence.   Continue Ketoconazole 2% cream but increase to BID, and  Mometasone 0.1% cream but increase to BID 3-4 days per week.   D/C Skin Medicinals mix since patient hasn't found it helpful.  mometasone (ELOCON) 0.1 % cream - B/L inframammary fold Apply 1 Application topically 2 (two) times daily under breast 3  times a week. ketoconazole (NIZORAL) 2 % cream - B/L inframammary fold Apply 1 Application topically 2 (two) times daily under the breast.  Return in about 3 months (around 02/23/2023) for intertrigo follow up .  Luther Redo, CMA, am acting as scribe for Sarina Ser, MD . Documentation: I have reviewed the above documentation for accuracy and completeness, and I agree with the above.  Sarina Ser, MD

## 2022-12-02 ENCOUNTER — Other Ambulatory Visit: Payer: Self-pay

## 2022-12-03 ENCOUNTER — Encounter: Payer: Self-pay | Admitting: Dermatology

## 2022-12-05 ENCOUNTER — Other Ambulatory Visit: Payer: Self-pay

## 2022-12-06 ENCOUNTER — Other Ambulatory Visit: Payer: Self-pay | Admitting: Nurse Practitioner

## 2022-12-06 ENCOUNTER — Other Ambulatory Visit: Payer: Self-pay

## 2022-12-06 MED ORDER — INSULIN GLARGINE-YFGN 100 UNIT/ML ~~LOC~~ SOPN
40.0000 [IU] | PEN_INJECTOR | Freq: Every day | SUBCUTANEOUS | 5 refills | Status: DC
  Filled 2022-12-06: qty 15, 37d supply, fill #0
  Filled 2023-01-21: qty 15, 37d supply, fill #1
  Filled 2023-02-21: qty 15, 37d supply, fill #2
  Filled 2023-04-06: qty 15, 37d supply, fill #3
  Filled 2023-05-13: qty 15, 37d supply, fill #4
  Filled 2023-06-08 – 2023-06-23 (×2): qty 15, 37d supply, fill #5

## 2022-12-08 ENCOUNTER — Other Ambulatory Visit: Payer: Self-pay

## 2022-12-23 ENCOUNTER — Other Ambulatory Visit: Payer: Self-pay

## 2022-12-23 ENCOUNTER — Other Ambulatory Visit: Payer: Self-pay | Admitting: Nurse Practitioner

## 2022-12-24 ENCOUNTER — Other Ambulatory Visit: Payer: Self-pay

## 2022-12-27 ENCOUNTER — Other Ambulatory Visit: Payer: Self-pay

## 2022-12-27 MED FILL — Albuterol Sulfate Inhal Aero 108 MCG/ACT (90MCG Base Equiv): RESPIRATORY_TRACT | 30 days supply | Qty: 6.7 | Fill #0 | Status: AC

## 2022-12-28 ENCOUNTER — Encounter: Payer: Self-pay | Admitting: Pharmacist

## 2022-12-28 ENCOUNTER — Other Ambulatory Visit: Payer: Self-pay

## 2022-12-29 ENCOUNTER — Other Ambulatory Visit: Payer: Self-pay

## 2023-01-07 ENCOUNTER — Ambulatory Visit: Payer: Commercial Managed Care - PPO | Admitting: Nurse Practitioner

## 2023-01-07 ENCOUNTER — Other Ambulatory Visit: Payer: Self-pay

## 2023-01-07 ENCOUNTER — Encounter: Payer: Self-pay | Admitting: Nurse Practitioner

## 2023-01-07 VITALS — BP 118/78 | HR 93 | Temp 98.0°F | Ht 58.5 in | Wt 213.0 lb

## 2023-01-07 DIAGNOSIS — I10 Essential (primary) hypertension: Secondary | ICD-10-CM | POA: Diagnosis not present

## 2023-01-07 DIAGNOSIS — E119 Type 2 diabetes mellitus without complications: Secondary | ICD-10-CM | POA: Diagnosis not present

## 2023-01-07 DIAGNOSIS — R232 Flushing: Secondary | ICD-10-CM | POA: Diagnosis not present

## 2023-01-07 DIAGNOSIS — Z794 Long term (current) use of insulin: Secondary | ICD-10-CM

## 2023-01-07 DIAGNOSIS — E785 Hyperlipidemia, unspecified: Secondary | ICD-10-CM

## 2023-01-07 MED ORDER — TIRZEPATIDE 7.5 MG/0.5ML ~~LOC~~ SOAJ
7.5000 mg | SUBCUTANEOUS | 5 refills | Status: DC
Start: 2023-01-07 — End: 2023-05-17
  Filled 2023-01-07 – 2023-01-27 (×2): qty 2, 28d supply, fill #0
  Filled 2023-02-21: qty 2, 28d supply, fill #1
  Filled 2023-03-22: qty 2, 28d supply, fill #2
  Filled 2023-04-21: qty 2, 28d supply, fill #3

## 2023-01-07 NOTE — Progress Notes (Unsigned)
Established Patient Office Visit  Subjective:  Patient ID: Tamara Wells, female    DOB: Oct 27, 1960  Age: 62 y.o. MRN: [id number]  CC:  Chief Complaint  Patient presents with   Medical Management of Chronic Issues    Hot flashes    HPI  Tamara Wells presents for chronic disease management.   Diabetes: She is taking mounjaro, metformin, glipizide and Semglee 40 units for diabetes. Her fasting BS at home ranges from lowest 120 and high as 160.  HTN: She is taking valsartan HCTZ for blood pressure control.  She reports hot flashes off-and-on going on for few months.  She is not taking anything for it.   Diabetes Pertinent negatives for hypoglycemia include no confusion, dizziness or headaches. Pertinent negatives for diabetes include no chest pain.  Hypertension Pertinent negatives include no chest pain, headaches or shortness of breath.     Past Medical History:  Diagnosis Date   Diabetes mellitus without complication    Dx 2006   Hyperlipidemia    Hypertension    Sleep apnea    Vertigo    2002    Past Surgical History:  Procedure Laterality Date   ABDOMINAL HYSTERECTOMY     2000   CARPAL TUNNEL RELEASE Left    CARPAL TUNNEL RELEASE Right 05/28/2019   Procedure: CARPAL TUNNEL RELEASE;  Surgeon: Deeann Saint, MD;  Location: [facility] ORS;  Service: Orthopedics;  Laterality: Right;   COLONOSCOPY WITH PROPOFOL N/A 03/21/2020   Procedure: COLONOSCOPY WITH PROPOFOL;  Surgeon: Pasty Spillers, MD;  Location: [facility] ENDOSCOPY;  Service: Endoscopy;  Laterality: N/A;   TONSILLECTOMY      Family History  Problem Relation Age of Onset   Diabetes Mother    Diabetes Father    Diabetes Maternal Grandmother    Breast cancer Neg Hx     Social History   Socioeconomic History   Marital status: Married    Spouse name: Not on file   Number of children: Not on file   Years of education: Not on file   Highest education level: Not on file  Occupational History   Not  on file  Tobacco Use   Smoking status: Former    Types: Cigarettes    Quit date: 09/27/2004    Years since quitting: 18.3   Smokeless tobacco: Never  Vaping Use   Vaping Use: Never used  Substance and Sexual Activity   Alcohol use: No   Drug use: No   Sexual activity: Not Currently    Birth control/protection: Surgical  Other Topics Concern   Not on file  Social History Narrative   Not on file   Social Determinants of Health   Financial Resource Strain: Not on file  Food Insecurity: Not on file  Transportation Needs: Not on file  Physical Activity: Not on file  Stress: Not on file  Social Connections: Not on file  Intimate Partner Violence: Not on file     Outpatient Medications Prior to Visit  Medication Sig Dispense Refill   albuterol (VENTOLIN HFA) 108 (90 Base) MCG/ACT inhaler Inhale 2 puffs into the lungs every 4 hours as needed for wheezing. 6.7 g 0   aspirin EC 81 MG tablet Take 81 mg by mouth daily.     Black Cohosh 40 MG CAPS Take 40 mg by mouth 2 (two) times daily.     blood glucose meter kit and supplies Dispense based on patient and insurance preference. Use up to four times daily as directed. (  FOR ICD-10 E10.9, E11.9). 1 each 0   Blood Pressure Monitoring (OMRON 3 SERIES BP MONITOR) DEVI use to check blood pressure 1 each 0   diphenhydrAMINE (BENADRYL) 25 MG tablet Take 25 mg by mouth daily as needed for allergies.     fluticasone (FLONASE) 50 MCG/ACT nasal spray Place 2 sprays into both nostrils daily. 16 g 6   furosemide (LASIX) 20 MG tablet Take 1 tablet (20 mg total) by mouth every other day. 30 tablet 3   gabapentin (NEURONTIN) 100 MG capsule Take 1 capsule (100 mg total) by mouth 3 (three) times daily. 90 capsule 3   glipiZIDE (GLUCOTROL) 10 MG tablet TAKE 1 TABLET BY MOUTH TWICE DAILY 180 tablet 5   glucose blood test strip Test as directed up to 4 times a day 100 each 0   insulin glargine-yfgn (SEMGLEE, YFGN,) 100 UNIT/ML Pen Inject 40 Units into the  skin daily. 15 mL 5   ketoconazole (NIZORAL) 2 % cream Apply 1 Application topically 2 (two) times daily under the breast. 60 g 5   Lancets (FREESTYLE) lancets Test up to 4 times a day as directed 100 each 0   latanoprost (XALATAN) 0.005 % ophthalmic solution instill 1 drop into affected eye(s) once daily in the evening 5 mL 3   latanoprost (XALATAN) 0.005 % ophthalmic solution Place 1 drop into both eyes daily in the evening. 2.5 mL 0   magic mouthwash (nystatin, lidocaine, diphenhydrAMINE, alum & mag hydroxide) suspension Swish and spit 5 mLs 3 (three) times daily as needed for mouth pain. 180 mL 0   meclizine (ANTIVERT) 25 MG tablet Take 25 mg by mouth 3 (three) times daily as needed for dizziness.      meloxicam (MOBIC) 15 MG tablet Take 1 tablet (15 mg total) by mouth daily with meal. 90 tablet 2   metFORMIN (GLUCOPHAGE) 1000 MG tablet TAKE 1 TABLET BY MOUTH TWICE DAILY 180 tablet 2   mometasone (ELOCON) 0.1 % cream Apply 1 Application topically 2 (two) times daily under breast 3 times a week. 45 g 5   Multiple Vitamins-Minerals (CENTRAL-VITE PO) Take 1 tablet by mouth daily.     rosuvastatin (CRESTOR) 10 MG tablet Take 1 tablet (10 mg total) by mouth daily. 90 tablet 3   valsartan-hydrochlorothiazide (DIOVAN-HCT) 80-12.5 MG tablet Take 1 tablet by mouth daily. 90 tablet 3   tirzepatide (MOUNJARO) 5 MG/0.5ML Pen Inject 5 mg into the skin once a week. 6 mL 0   No facility-administered medications prior to visit.    Allergies  Allergen Reactions   No Known Allergies     ROS Review of Systems  Constitutional: Negative.   HENT:  Negative for congestion and mouth sores.   Eyes: Negative.   Respiratory:  Negative for chest tightness and shortness of breath.   Cardiovascular:  Negative for chest pain.  Gastrointestinal:  Negative for abdominal distention and constipation.  Genitourinary:  Negative for difficulty urinating.  Musculoskeletal:  Negative for arthralgias.  Neurological:   Negative for dizziness, facial asymmetry and headaches.  Psychiatric/Behavioral:  Negative for agitation, behavioral problems and confusion.       Objective:    Physical Exam Constitutional:      Appearance: Normal appearance. She is obese.  HENT:     Head: Normocephalic.     Right Ear: Tympanic membrane normal.     Left Ear: Tympanic membrane normal.     Nose: Nose normal.     Mouth/Throat:     Mouth: Mucous  membranes are moist.     Pharynx: Oropharynx is clear.  Eyes:     Extraocular Movements: Extraocular movements intact.     Conjunctiva/sclera: Conjunctivae normal.     Pupils: Pupils are equal, round, and reactive to light.  Cardiovascular:     Rate and Rhythm: Normal rate and regular rhythm.     Pulses: Normal pulses.     Heart sounds: Normal heart sounds.  Pulmonary:     Effort: Pulmonary effort is normal. No respiratory distress.     Breath sounds: Normal breath sounds. No rhonchi.  Abdominal:     General: Bowel sounds are normal.     Palpations: Abdomen is soft. There is no mass.     Tenderness: There is no abdominal tenderness.     Hernia: No hernia is present.  Musculoskeletal:        General: Normal range of motion.     Cervical back: Neck supple. No tenderness.  Skin:    General: Skin is warm.     Capillary Refill: Capillary refill takes less than 2 seconds.  Neurological:     General: No focal deficit present.     Mental Status: She is alert and oriented to person, place, and time. Mental status is at baseline.  Psychiatric:        Mood and Affect: Mood normal.        Behavior: Behavior normal.        Thought Content: Thought content normal.        Judgment: Judgment normal.     BP 118/78   Pulse 93   Temp 98 F (36.7 C) (Oral)   Ht 4' 10.5" (1.486 m)   Wt 213 lb (96.6 kg)   SpO2 99%   BMI 43.76 kg/m  Wt Readings from Last 3 Encounters:  01/07/23 213 lb (96.6 kg)  10/08/22 215 lb (97.5 kg)  07/23/22 209 lb 12.8 oz (95.2 kg)     Health  Maintenance  Topic Date Due   FOOT EXAM  Never done   Diabetic kidney evaluation - Urine ACR  Never done   PAP SMEAR-Modifier  Never done   Zoster Vaccines- Shingrix (1 of 2) Never done   COLONOSCOPY (Pts 45-73yrs Insurance coverage will need to be confirmed)  03/21/2021   COVID-19 Vaccine (4 - 2023-24 season) 05/28/2022   OPHTHALMOLOGY EXAM  06/30/2022   INFLUENZA VACCINE  04/28/2023   HEMOGLOBIN A1C  07/09/2023   MAMMOGRAM  12/27/2023   Diabetic kidney evaluation - eGFR measurement  01/07/2024   DTaP/Tdap/Td (3 - Td or Tdap) 07/05/2032   Hepatitis C Screening  Completed   HIV Screening  Completed   HPV VACCINES  Aged Out    There are no preventive care reminders to display for this patient.  Lab Results  Component Value Date   TSH 1.920 01/07/2023   Lab Results  Component Value Date   WBC 9.9 01/07/2023   HGB 12.2 01/07/2023   HCT 36.8 01/07/2023   MCV 85 01/07/2023   PLT 317 01/07/2023   Lab Results  Component Value Date   NA 142 01/07/2023   K 4.0 01/07/2023   CO2 23 01/07/2023   GLUCOSE 53 (L) 01/07/2023   BUN 22 01/07/2023   CREATININE 0.79 01/07/2023   BILITOT <0.2 01/07/2023   ALKPHOS 82 01/07/2023   AST 16 01/07/2023   ALT 17 01/07/2023   PROT 6.4 01/07/2023   ALBUMIN 4.3 01/07/2023   CALCIUM 9.6 01/07/2023   ANIONGAP 10 09/17/2018  EGFR 86 01/07/2023   Lab Results  Component Value Date   CHOL 128 01/07/2023   Lab Results  Component Value Date   HDL 44 01/07/2023   Lab Results  Component Value Date   LDLCALC 51 01/07/2023   Lab Results  Component Value Date   TRIG 203 (H) 01/07/2023   Lab Results  Component Value Date   CHOLHDL 2.9 01/07/2023   Lab Results  Component Value Date   HGBA1C 7.9 (H) 01/07/2023      Assessment & Plan:   Problem List Items Addressed This Visit       Cardiovascular and Mediastinum   Hypertension - Primary    Blood pressure 118/78 in the office today. Advised to consume low-salt heart healthy  diet. Continue valsartan HCTZ.      Relevant Orders   CBC with Differential/Platelet (Completed)   Comprehensive metabolic panel (Completed)   Lipid panel (Completed)   TSH (Completed)   Hot flashes    Advised patient to cut down on caffeine, spicy food and alcohol. Would consider referral to GYN if symptoms not improving.        Endocrine   Type 2 diabetes mellitus without complication, with long-term current use of insulin    Last hemoglobin A1c 7.1 on 07/23/2022. Nutritional education provided. Continue the current medication regimen. Will check hemoglobin A1c today      Relevant Medications   tirzepatide (MOUNJARO) 7.5 MG/0.5ML Pen   Other Relevant Orders   HgB A1c (Completed)     Other   Hyperlipidemia    Will check lipid panel today. Advised patient to watch diet and follow regular exercise schedule.        Meds ordered this encounter  Medications   tirzepatide (MOUNJARO) 7.5 MG/0.5ML Pen    Sig: Inject 7.5 mg into the skin once a week.    Dispense:  6 mL    Refill:  5     Follow-up: Return in about 4 months (around 05/09/2023) for chronic management.    Kara Dies, NP

## 2023-01-07 NOTE — Patient Instructions (Addendum)
Labs ordered. Rx sent to pharmacy. Avoid caffeine, smoking and spicy food. Regular exercise.

## 2023-01-08 LAB — COMPREHENSIVE METABOLIC PANEL
ALT: 17 IU/L (ref 0–32)
AST: 16 IU/L (ref 0–40)
Albumin/Globulin Ratio: 2 (ref 1.2–2.2)
Albumin: 4.3 g/dL (ref 3.8–4.9)
Alkaline Phosphatase: 82 IU/L (ref 44–121)
BUN/Creatinine Ratio: 28 (ref 12–28)
BUN: 22 mg/dL (ref 8–27)
Bilirubin Total: <0.2 mg/dL (ref 0.0–1.2)
CO2: 23 mmol/L (ref 20–29)
Calcium: 9.6 mg/dL (ref 8.7–10.3)
Chloride: 102 mmol/L (ref 96–106)
Creatinine, Ser: 0.79 mg/dL (ref 0.57–1.00)
Globulin, Total: 2.1 g/dL (ref 1.5–4.5)
Glucose: 53 mg/dL — ABNORMAL LOW (ref 70–99)
Potassium: 4 mmol/L (ref 3.5–5.2)
Sodium: 142 mmol/L (ref 134–144)
Total Protein: 6.4 g/dL (ref 6.0–8.5)
eGFR: 86 mL/min/{1.73_m2} (ref >59)

## 2023-01-08 LAB — CBC WITH DIFFERENTIAL/PLATELET
Basophils Absolute: 0 10*3/uL (ref 0.0–0.2)
Basos: 0 %
EOS (ABSOLUTE): 0 10*3/uL (ref 0.0–0.4)
Eos: 0 %
Hematocrit: 36.8 % (ref 34.0–46.6)
Hemoglobin: 12.2 g/dL (ref 11.1–15.9)
Immature Grans (Abs): 0 10*3/uL (ref 0.0–0.1)
Immature Granulocytes: 0 %
Lymphocytes Absolute: 4.3 10*3/uL — ABNORMAL HIGH (ref 0.7–3.1)
Lymphs: 44 %
MCH: 28.2 pg (ref 26.6–33.0)
MCHC: 33.2 g/dL (ref 31.5–35.7)
MCV: 85 fL (ref 79–97)
Monocytes Absolute: 0.7 10*3/uL (ref 0.1–0.9)
Monocytes: 7 %
Neutrophils Absolute: 4.8 10*3/uL (ref 1.4–7.0)
Neutrophils: 49 %
Platelets: 317 10*3/uL (ref 150–450)
RBC: 4.33 x10E6/uL (ref 3.77–5.28)
RDW: 14 % (ref 11.7–15.4)
WBC: 9.9 10*3/uL (ref 3.4–10.8)

## 2023-01-08 LAB — LIPID PANEL
Chol/HDL Ratio: 2.9 ratio (ref 0.0–4.4)
Cholesterol, Total: 128 mg/dL (ref 100–199)
HDL: 44 mg/dL (ref >39)
LDL Chol Calc (NIH): 51 mg/dL (ref 0–99)
Triglycerides: 203 mg/dL — ABNORMAL HIGH (ref 0–149)
VLDL Cholesterol Cal: 33 mg/dL (ref 5–40)

## 2023-01-08 LAB — HEMOGLOBIN A1C
Est. average glucose Bld gHb Est-mCnc: 180 mg/dL
Hgb A1c MFr Bld: 7.9 % — ABNORMAL HIGH (ref 4.8–5.6)

## 2023-01-08 LAB — TSH: TSH: 1.92 u[IU]/mL (ref 0.450–4.500)

## 2023-01-09 ENCOUNTER — Other Ambulatory Visit: Payer: Self-pay

## 2023-01-13 ENCOUNTER — Other Ambulatory Visit: Payer: Self-pay

## 2023-01-13 DIAGNOSIS — E785 Hyperlipidemia, unspecified: Secondary | ICD-10-CM | POA: Insufficient documentation

## 2023-01-13 DIAGNOSIS — R232 Flushing: Secondary | ICD-10-CM | POA: Insufficient documentation

## 2023-01-13 NOTE — Assessment & Plan Note (Signed)
Blood pressure 118/78 in the office today. Advised to consume low-salt heart healthy diet. Continue valsartan HCTZ.

## 2023-01-13 NOTE — Assessment & Plan Note (Signed)
Last hemoglobin A1c 7.1 on 07/23/2022. Nutritional education provided. Continue the current medication regimen. Will check hemoglobin A1c today

## 2023-01-13 NOTE — Assessment & Plan Note (Signed)
Advised patient to cut down on caffeine, spicy food and alcohol. Would consider referral to GYN if symptoms not improving.

## 2023-01-13 NOTE — Assessment & Plan Note (Signed)
Will check lipid panel today. Advised patient to watch diet and follow regular exercise schedule.

## 2023-01-21 ENCOUNTER — Other Ambulatory Visit: Payer: Self-pay

## 2023-01-27 ENCOUNTER — Other Ambulatory Visit: Payer: Self-pay

## 2023-02-09 ENCOUNTER — Other Ambulatory Visit: Payer: Self-pay

## 2023-02-10 ENCOUNTER — Other Ambulatory Visit: Payer: Self-pay

## 2023-02-10 ENCOUNTER — Other Ambulatory Visit: Payer: Self-pay | Admitting: Nurse Practitioner

## 2023-02-10 MED FILL — Glipizide Tab 10 MG: ORAL | 90 days supply | Qty: 180 | Fill #0 | Status: AC

## 2023-02-13 ENCOUNTER — Other Ambulatory Visit: Payer: Self-pay

## 2023-02-14 ENCOUNTER — Other Ambulatory Visit: Payer: Self-pay | Admitting: Nurse Practitioner

## 2023-02-14 ENCOUNTER — Other Ambulatory Visit: Payer: Self-pay

## 2023-02-15 ENCOUNTER — Other Ambulatory Visit: Payer: Self-pay

## 2023-02-15 MED FILL — Metformin HCl Tab 1000 MG: ORAL | 90 days supply | Qty: 180 | Fill #0 | Status: AC

## 2023-02-16 ENCOUNTER — Other Ambulatory Visit: Payer: Self-pay

## 2023-02-23 ENCOUNTER — Other Ambulatory Visit: Payer: Self-pay

## 2023-02-24 ENCOUNTER — Ambulatory Visit: Payer: Commercial Managed Care - PPO | Admitting: Dermatology

## 2023-02-24 ENCOUNTER — Other Ambulatory Visit: Payer: Self-pay

## 2023-02-24 VITALS — BP 138/82 | HR 79

## 2023-02-24 DIAGNOSIS — Z79899 Other long term (current) drug therapy: Secondary | ICD-10-CM

## 2023-02-24 DIAGNOSIS — L304 Erythema intertrigo: Secondary | ICD-10-CM | POA: Diagnosis not present

## 2023-02-24 DIAGNOSIS — Z7189 Other specified counseling: Secondary | ICD-10-CM

## 2023-02-24 DIAGNOSIS — L2089 Other atopic dermatitis: Secondary | ICD-10-CM

## 2023-02-24 MED ORDER — EUCRISA 2 % EX OINT
TOPICAL_OINTMENT | CUTANEOUS | 2 refills | Status: DC
  Filled 2023-02-24: qty 60, 30d supply, fill #0
  Filled 2023-04-04: qty 60, 30d supply, fill #1
  Filled 2023-05-13: qty 60, 30d supply, fill #2

## 2023-02-24 MED ORDER — FLUCONAZOLE 200 MG PO TABS
200.0000 mg | ORAL_TABLET | ORAL | 1 refills | Status: DC
  Filled 2023-02-24: qty 12, 28d supply, fill #0
  Filled 2023-03-22: qty 12, 28d supply, fill #1

## 2023-02-24 NOTE — Progress Notes (Signed)
   Follow-Up Visit   Subjective  Tamara Wells is a 62 y.o. female who presents for the following: 3 months f/u on a rash under her breast treating with Ketoconazole cream twice a day, Mometasone cream 3 days a week and Zeasorb powder with a poor response, past treatment skin medicinals intertrigo cream made her skin itch worse.   The following portions of the chart were reviewed this encounter and updated as appropriate: medications, allergies, medical history  Review of Systems:  No other skin or systemic complaints except as noted in HPI or Assessment and Plan.  Objective  Well appearing patient in no apparent distress; mood and affect are within normal limits. A focused examination was performed of the following areas: face, breast, chest  Relevant exam findings are noted in the Assessment and Plan.       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 Condition is symptomatic/ bothersome to patient. Not currently at goal.  Intertrigo is a chronic recurrent rash that occurs in skin fold areas that may be associated with friction; heat; moisture; yeast; fungus; and bacteria.  It is exacerbated by increased movement / activity; sweating; and higher atmospheric temperature.  Atopic dermatitis (eczema) is a chronic, relapsing, pruritic condition that can significantly affect quality of life. It is often associated with allergic rhinitis and/or asthma and can require treatment with topical medications, phototherapy, or in severe cases biologic injectable medication (Dupixent; Adbry) or Oral JAK inhibitors.  Treatment Plan Start  Eucrisa ointment apply in the morning  Continue Ketoconazole cream apply at bedtime  Take Diflucan 200 mg 1 tablet Monday/Wednesday/Friday each week for 4 weeks #12 1 RF  Patient has 1 refill of  Diflucan she will only use if needed after taking for 1 month  Intertrigo  Medication management  Other atopic dermatitis  Counseling and coordination of care   May consider Opzelura if [name] is not not covered by insurance   Return in about 3 months (around 05/27/2023) for intertrigo .  IAngelique Holm, CMA, am acting as scribe for Armida Sans, MD .   Documentation: I have reviewed the above documentation for accuracy and completeness, and I agree with the above.  Armida Sans, MD

## 2023-02-24 NOTE — Patient Instructions (Addendum)
INTERTRIGO Intertrigo is a chronic recurrent rash that occurs in skin fold areas that may be associated with friction; heat; moisture; yeast; fungus; and bacteria.  It is exacerbated by increased movement / activity; sweating; and higher atmospheric temperature.  Apply Eucrisa ointment apply in the morning  Apply Ketoconazole cream apply at bedtime  Take Diflucan 200 mg 1 tablet Monday/Wednesday/Friday each week for 4 weeks      Due to recent changes in healthcare laws, you may see results of your pathology and/or laboratory studies on MyChart before the doctors have had a chance to review them. We understand that in some cases there may be results that are confusing or concerning to you. Please understand that not all results are received at the same time and often the doctors may need to interpret multiple results in order to provide you with the best plan of care or course of treatment. Therefore, we ask that you please give [place] 2 business days to thoroughly review all your results before contacting the office for clarification. Should we see a critical lab result, you will be contacted sooner.   If You Need Anything After Your Visit  If you have any questions or concerns for your doctor, please call our main line at [phone] and press option 4 to reach your doctor's medical assistant. If no one answers, please leave a voicemail as directed and we will return your call as soon as possible. Messages left after 4 pm will be answered the following business day.   You may also send [place] a message via MyChart. We typically respond to MyChart messages within 1-2 business days.  For prescription refills, please ask your pharmacy to contact our office. Our fax number is [phone].  If you have an urgent issue when the clinic is closed that cannot wait until the next business day, you can page your doctor at the number below.    Please note that while we do our best to be available for urgent  issues outside of office hours, we are not available 24/7.   If you have an urgent issue and are unable to reach [place], you may choose to seek medical care at your doctor's office, retail clinic, urgent care center, or emergency room.  If you have a medical emergency, please immediately call 911 or go to the emergency department.  Pager Numbers  - Dr. Gwen Pounds: [phone]  - Dr. Neale Burly: [phone]  - Dr. Roseanne Reno: [phone]  In the event of inclement weather, please call our main line at [phone] for an update on the status of any delays or closures.  Dermatology Medication Tips: Please keep the boxes that topical medications come in in order to help keep track of the instructions about where and how to use these. Pharmacies typically print the medication instructions only on the boxes and not directly on the medication tubes.   If your medication is too expensive, please contact our office at [phone] option 4 or send [place] a message through MyChart.   We are unable to tell what your co-pay for medications will be in advance as this is different depending on your insurance coverage. However, we may be able to find a substitute medication at lower cost or fill out paperwork to get insurance to cover a needed medication.   If a prior authorization is required to get your medication covered by your insurance company, please allow [place] 1-2 business days to complete this process.  Drug prices often vary depending on where  the prescription is filled and some pharmacies may offer cheaper prices.  The website www.goodrx.com contains coupons for medications through different pharmacies. The prices here do not account for what the cost may be with help from insurance (it may be cheaper with your insurance), but the website can give you the price if you did not use any insurance.  - You can print the associated coupon and take it with your prescription to the pharmacy.  - You may also stop by  our office during regular business hours and pick up a GoodRx coupon card.  - If you need your prescription sent electronically to a different pharmacy, notify our office through [facility] or by phone at [phone] option 4.     Si Usted Necesita Algo Despus de Su Visita  Tambin puede enviarnos un mensaje a travs de Clinical cytogeneticist. Por lo general respondemos a los mensajes de MyChart en el transcurso de 1 a 2 das hbiles.  Para renovar recetas, por favor pida a su farmacia que se ponga en contacto con nuestra oficina. Annie Sable de fax es [place] [phone].  Si tiene un asunto urgente cuando la clnica est cerrada y que no puede esperar hasta el siguiente da hbil, puede llamar/localizar a su doctor(a) al nmero que aparece a continuacin.   Por favor, tenga en cuenta que aunque hacemos todo lo posible para estar disponibles para asuntos urgentes fuera del horario de [place], no estamos disponibles las 24 horas del da, los 7 [address]  [address] de la [place].   Si tiene un problema urgente y no puede comunicarse con nosotros, puede optar por buscar atencin mdica  en el consultorio de su doctor(a), en una clnica privada, en un centro de atencin urgente o en una sala de emergencias.  Si tiene Engineer, drilling, por favor llame inmediatamente al 911 o vaya a la sala de emergencias.  Nmeros de bper  - Dr. Gwen Pounds: [phone]  - Dra. Moye: [phone]  - Dra. Roseanne Reno: [phone]  En caso de inclemencias del [place], por favor llame a Lacy Duverney principal al [phone] para una actualizacin sobre el [place] de cualquier retraso o cierre.  Consejos para la medicacin en dermatologa: Por favor, guarde las cajas en las que vienen los medicamentos de uso tpico para ayudarle a seguir las instrucciones sobre dnde y cmo usarlos. Las farmacias generalmente imprimen las instrucciones del medicamento slo en las cajas y no directamente en los tubos del [place].   Si su  medicamento es muy caro, por favor, pngase en contacto con Rolm Gala llamando al [phone] y presione la opcin 4 o envenos un mensaje a travs de Clinical cytogeneticist.   No podemos decirle cul ser su copago por los medicamentos por adelantado ya que esto es diferente dependiendo de la cobertura de su seguro. Sin embargo, es posible que podamos encontrar un medicamento sustituto a Audiological scientist un formulario para que el seguro cubra el medicamento que se considera necesario.   Si se requiere una autorizacin previa para que su compaa de seguros [place] su medicamento, por favor permtanos de 1 a 2 das hbiles para completar [address]  Los precios de los medicamentos varan con frecuencia dependiendo del Environmental consultant de dnde se surte la receta y alguna farmacias pueden ofrecer precios ms baratos.  El sitio web www.goodrx.com tiene cupones para medicamentos de Health and safety inspector. Los precios aqu no tienen en cuenta lo que podra costar con la ayuda del seguro (puede ser ms barato con su seguro), pero el sitio  web puede darle el precio si no Visual merchandiser.  - Puede imprimir el cupn correspondiente y llevarlo con su receta a la farmacia.  - Tambin puede pasar por nuestra oficina durante el horario de atencin regular y Education officer, museum una tarjeta de cupones de GoodRx.  - Si necesita que su receta se enve electrnicamente a una farmacia diferente, informe a nuestra oficina a travs de MyChart de [place] o por telfono llamando al [phone] y presione la opcin 4.

## 2023-02-25 ENCOUNTER — Other Ambulatory Visit: Payer: Self-pay

## 2023-03-09 ENCOUNTER — Encounter: Payer: Self-pay | Admitting: Dermatology

## 2023-03-10 ENCOUNTER — Telehealth: Payer: Self-pay

## 2023-03-10 NOTE — Telephone Encounter (Signed)
Called pt to see how she was doing, patient report she is doing much better on this regimen  Diflucan orally is helping. Pt was able to get Eucrisa filled for $10.  Patient report she will not get the refill of Diflucan filled since she is doing so much better, she will continue Eucrisa ointment and keep scheduled appt in Sept.

## 2023-03-17 ENCOUNTER — Other Ambulatory Visit (HOSPITAL_COMMUNITY): Payer: Self-pay

## 2023-03-18 ENCOUNTER — Other Ambulatory Visit: Payer: Self-pay

## 2023-03-21 ENCOUNTER — Other Ambulatory Visit: Payer: Self-pay

## 2023-03-21 ENCOUNTER — Other Ambulatory Visit: Payer: Self-pay | Admitting: Nurse Practitioner

## 2023-03-22 ENCOUNTER — Other Ambulatory Visit: Payer: Self-pay

## 2023-03-22 MED FILL — Furosemide Tab 20 MG: ORAL | 60 days supply | Qty: 30 | Fill #0 | Status: AC

## 2023-03-24 ENCOUNTER — Other Ambulatory Visit: Payer: Self-pay

## 2023-04-04 ENCOUNTER — Other Ambulatory Visit: Payer: Self-pay

## 2023-04-08 ENCOUNTER — Other Ambulatory Visit: Payer: Self-pay

## 2023-04-15 DIAGNOSIS — H401132 Primary open-angle glaucoma, bilateral, moderate stage: Secondary | ICD-10-CM | POA: Diagnosis not present

## 2023-04-20 ENCOUNTER — Other Ambulatory Visit: Payer: Self-pay

## 2023-04-20 MED ORDER — LATANOPROST 0.005 % OP SOLN
1.0000 [drp] | Freq: Every day | OPHTHALMIC | 1 refills | Status: DC
  Filled 2023-04-20: qty 5, 75d supply, fill #0
  Filled 2023-07-17: qty 5, 75d supply, fill #1

## 2023-04-21 ENCOUNTER — Other Ambulatory Visit: Payer: Self-pay

## 2023-04-21 ENCOUNTER — Other Ambulatory Visit: Payer: Self-pay | Admitting: Nurse Practitioner

## 2023-04-21 MED ORDER — FLUTICASONE PROPIONATE 50 MCG/ACT NA SUSP
2.0000 | Freq: Every day | NASAL | 6 refills | Status: AC
  Filled 2023-04-21: qty 16, 30d supply, fill #0
  Filled 2023-06-08: qty 16, 30d supply, fill #1

## 2023-04-27 ENCOUNTER — Encounter (INDEPENDENT_AMBULATORY_CARE_PROVIDER_SITE_OTHER): Payer: Self-pay

## 2023-05-12 MED FILL — Glipizide Tab 10 MG: ORAL | 90 days supply | Qty: 180 | Fill #1 | Status: AC

## 2023-05-13 ENCOUNTER — Other Ambulatory Visit: Payer: Self-pay

## 2023-05-15 ENCOUNTER — Other Ambulatory Visit: Payer: Self-pay | Admitting: Nurse Practitioner

## 2023-05-15 DIAGNOSIS — E119 Type 2 diabetes mellitus without complications: Secondary | ICD-10-CM

## 2023-05-15 MED FILL — Metformin HCl Tab 1000 MG: ORAL | 90 days supply | Qty: 180 | Fill #1 | Status: AC

## 2023-05-15 MED FILL — Furosemide Tab 20 MG: ORAL | 60 days supply | Qty: 30 | Fill #1 | Status: AC

## 2023-05-17 ENCOUNTER — Encounter: Payer: Self-pay | Admitting: Nurse Practitioner

## 2023-05-17 ENCOUNTER — Other Ambulatory Visit: Payer: Self-pay

## 2023-05-17 ENCOUNTER — Ambulatory Visit: Payer: Commercial Managed Care - PPO | Admitting: Nurse Practitioner

## 2023-05-17 VITALS — BP 102/68 | HR 102 | Temp 98.0°F | Ht 58.5 in | Wt 212.0 lb

## 2023-05-17 DIAGNOSIS — F32A Depression, unspecified: Secondary | ICD-10-CM

## 2023-05-17 DIAGNOSIS — F419 Anxiety disorder, unspecified: Secondary | ICD-10-CM | POA: Diagnosis not present

## 2023-05-17 DIAGNOSIS — E785 Hyperlipidemia, unspecified: Secondary | ICD-10-CM

## 2023-05-17 DIAGNOSIS — Z794 Long term (current) use of insulin: Secondary | ICD-10-CM

## 2023-05-17 DIAGNOSIS — I10 Essential (primary) hypertension: Secondary | ICD-10-CM | POA: Diagnosis not present

## 2023-05-17 DIAGNOSIS — E119 Type 2 diabetes mellitus without complications: Secondary | ICD-10-CM | POA: Diagnosis not present

## 2023-05-17 MED ORDER — TIRZEPATIDE 10 MG/0.5ML ~~LOC~~ SOAJ
10.0000 mg | SUBCUTANEOUS | 2 refills | Status: DC
  Filled 2023-05-17: qty 2, 28d supply, fill #0
  Filled 2023-06-08: qty 2, 28d supply, fill #1
  Filled 2023-07-17: qty 2, 28d supply, fill #2
  Filled 2023-08-22: qty 2, 28d supply, fill #3
  Filled 2023-09-19: qty 2, 28d supply, fill #4

## 2023-05-17 MED ORDER — HYDROXYZINE PAMOATE 25 MG PO CAPS
25.0000 mg | ORAL_CAPSULE | Freq: Three times a day (TID) | ORAL | 0 refills | Status: AC | PRN
  Filled 2023-05-17: qty 30, 10d supply, fill #0

## 2023-05-17 MED ORDER — GABAPENTIN 100 MG PO CAPS
100.0000 mg | ORAL_CAPSULE | Freq: Three times a day (TID) | ORAL | 3 refills | Status: DC
Start: 2023-05-17 — End: 2023-12-12
  Filled 2023-05-17: qty 90, 30d supply, fill #0
  Filled 2023-06-08: qty 90, 30d supply, fill #1
  Filled 2023-08-11: qty 90, 30d supply, fill #2
  Filled 2023-11-04: qty 90, 30d supply, fill #3

## 2023-05-17 MED ORDER — NYSTATIN 100000 UNIT/ML MT SUSP
5.0000 mL | Freq: Three times a day (TID) | OROMUCOSAL | 0 refills | Status: AC | PRN
  Filled 2023-05-17: qty 180, 12d supply, fill #0

## 2023-05-17 NOTE — Progress Notes (Signed)
Established Patient Office Visit  Subjective:  Patient ID: Tamara Wells, female    DOB: Oct 15, 1960  Age: 62 y.o. MRN: [id number]  CC:  Chief Complaint  Patient presents with   Medical Management of Chronic Issues    HPI  Tamara Wells presents for chronic disease management.   Diabetes: She checks her blood sugar at home ranges between 120-130.  She takes her medication regularly.  HTN: She is taking valsartan HCTZ for blood pressure control.  He had some family stress going on that is making her anxious, she would like to get started on something to control her anxiety.  Past Medical History:  Diagnosis Date   Diabetes mellitus without complication (HCC)    Dx 2006   Hyperlipidemia    Hypertension    Sleep apnea    Vertigo    2002    Past Surgical History:  Procedure Laterality Date   ABDOMINAL HYSTERECTOMY     2000   CARPAL TUNNEL RELEASE Left    CARPAL TUNNEL RELEASE Right 05/28/2019   Procedure: CARPAL TUNNEL RELEASE;  Surgeon: Deeann Saint, MD;  Location: [facility];  Service: Orthopedics;  Laterality: Right;   COLONOSCOPY WITH PROPOFOL N/A 03/21/2020   Procedure: COLONOSCOPY WITH PROPOFOL;  Surgeon: Pasty Spillers, MD;  Location: [facility];  Service: Endoscopy;  Laterality: N/A;   TONSILLECTOMY      Family History  Problem Relation Age of Onset   Diabetes Mother    Diabetes Father    Diabetes Maternal Grandmother    Breast cancer Neg Hx     Social History   Socioeconomic History   Marital status: Married    Spouse name: Not on file   Number of children: Not on file   Years of education: Not on file   Highest education level: Not on file  Occupational History   Not on file  Tobacco Use   Smoking status: Former    Current packs/day: 0.00    Types: Cigarettes    Quit date: 09/27/2004    Years since quitting: 18.6   Smokeless tobacco: Never  Vaping Use   Vaping status: Never Used  Substance and Sexual Activity   Alcohol use:  No   Drug use: No   Sexual activity: Not Currently    Birth control/protection: Surgical  Other Topics Concern   Not on file  Social History Narrative   Not on file   Social Determinants of Health   Financial Resource Strain: Not on file  Food Insecurity: Not on file  Transportation Needs: Not on file  Physical Activity: Not on file  Stress: Not on file  Social Connections: Not on file  Intimate Partner Violence: Not on file     Outpatient Medications Prior to Visit  Medication Sig Dispense Refill   albuterol (VENTOLIN HFA) 108 (90 Base) MCG/ACT inhaler Inhale 2 puffs into the lungs every 4 hours as needed for wheezing. 6.7 g 0   aspirin EC 81 MG tablet Take 81 mg by mouth daily.     Black Cohosh 40 MG CAPS Take 40 mg by mouth 2 (two) times daily.     blood glucose meter kit and supplies Dispense based on patient and insurance preference. Use up to four times daily as directed. (FOR ICD-10 E10.9, E11.9). 1 each 0   Blood Pressure Monitoring (OMRON 3 SERIES BP MONITOR) DEVI use to check blood pressure 1 each 0   Crisaborole (EUCRISA) 2 % OINT Apply to affected skin on  the breast in the morning 60 g 2   fluconazole (DIFLUCAN) 200 MG tablet Take 1 tablet (200 mg total) by mouth every Monday, Wednesday, and Friday. 12 tablet 1   fluticasone (FLONASE) 50 MCG/ACT nasal spray Place 2 sprays into both nostrils daily. 16 g 6   furosemide (LASIX) 20 MG tablet Take 1 tablet (20 mg total) by mouth every other day. 30 tablet 3   glipiZIDE (GLUCOTROL) 10 MG tablet TAKE 1 TABLET BY MOUTH TWICE DAILY 180 tablet 5   glucose blood test strip Test as directed up to 4 times a day 100 each 0   insulin glargine-yfgn (SEMGLEE, YFGN,) 100 UNIT/ML Pen Inject 40 Units into the skin daily. 15 mL 5   ketoconazole (NIZORAL) 2 % cream Apply 1 Application topically 2 (two) times daily under the breast. 60 g 5   Lancets (FREESTYLE) lancets Test up to 4 times a day as directed 100 each 0   latanoprost (XALATAN)  0.005 % ophthalmic solution instill 1 drop into affected eye(s) once daily in the evening 5 mL 3   latanoprost (XALATAN) 0.005 % ophthalmic solution Place 1 drop into both eyes daily in the evening. 2.5 mL 0   latanoprost (XALATAN) 0.005 % ophthalmic solution Place 1 drop into affected eyes daily in the evening. 5 mL 1   meclizine (ANTIVERT) 25 MG tablet Take 25 mg by mouth 3 (three) times daily as needed for dizziness.      meloxicam (MOBIC) 15 MG tablet Take 1 tablet (15 mg total) by mouth daily with meal. 90 tablet 2   metFORMIN (GLUCOPHAGE) 1000 MG tablet Take 1 tablet (1,000 mg total) by mouth 2 (two) times daily. 180 tablet 2   mometasone (ELOCON) 0.1 % cream Apply 1 Application topically 2 (two) times daily under breast 3 times a week. 45 g 5   Multiple Vitamins-Minerals (CENTRAL-VITE PO) Take 1 tablet by mouth daily.     rosuvastatin (CRESTOR) 10 MG tablet Take 1 tablet (10 mg total) by mouth daily. 90 tablet 3   valsartan-hydrochlorothiazide (DIOVAN-HCT) 80-12.5 MG tablet Take 1 tablet by mouth daily. 90 tablet 3   diphenhydrAMINE (BENADRYL) 25 MG tablet Take 25 mg by mouth daily as needed for allergies.     gabapentin (NEURONTIN) 100 MG capsule Take 1 capsule (100 mg total) by mouth 3 (three) times daily. 90 capsule 3   magic mouthwash (nystatin, lidocaine, diphenhydrAMINE, alum & mag hydroxide) suspension Swish and spit 5 mLs 3 (three) times daily as needed for mouth pain. 180 mL 0   tirzepatide (MOUNJARO) 7.5 MG/0.5ML Pen Inject 7.5 mg into the skin once a week. 6 mL 5   No facility-administered medications prior to visit.    Allergies  Allergen Reactions   No Known Allergies     ROS Review of Systems  All other systems reviewed and are negative.     Objective:    Physical Exam Constitutional:      Appearance: Normal appearance. She is obese.  HENT:     Head: Normocephalic.     Right Ear: Tympanic membrane normal.     Left Ear: Tympanic membrane normal.     Nose:  Nose normal.     Mouth/Throat:     Mouth: Mucous membranes are moist.     Pharynx: Oropharynx is clear.  Eyes:     Extraocular Movements: Extraocular movements intact.     Conjunctiva/sclera: Conjunctivae normal.     Pupils: Pupils are equal, round, and reactive to light.  Cardiovascular:     Rate and Rhythm: Normal rate and regular rhythm.     Pulses: Normal pulses.     Heart sounds: Normal heart sounds.  Pulmonary:     Effort: Pulmonary effort is normal. No respiratory distress.     Breath sounds: Normal breath sounds. No rhonchi.  Abdominal:     General: Bowel sounds are normal.     Palpations: Abdomen is soft. There is no mass.     Tenderness: There is no abdominal tenderness.     Hernia: No hernia is present.  Musculoskeletal:        General: Normal range of motion.     Cervical back: Neck supple. No tenderness.  Skin:    General: Skin is warm.     Capillary Refill: Capillary refill takes less than 2 seconds.  Neurological:     General: No focal deficit present.     Mental Status: She is alert and oriented to person, place, and time. Mental status is at baseline.  Psychiatric:        Mood and Affect: Mood normal.        Behavior: Behavior normal.        Thought Content: Thought content normal.        Judgment: Judgment normal.     BP 102/68   Pulse (!) 102   Temp 98 F (36.7 C)   Ht 4' 10.5" (1.486 m)   Wt 212 lb (96.2 kg)   SpO2 98%   BMI 43.55 kg/m  Wt Readings from Last 3 Encounters:  05/17/23 212 lb (96.2 kg)  01/07/23 213 lb (96.6 kg)  10/08/22 215 lb (97.5 kg)     Health Maintenance  Topic Date Due   FOOT EXAM  Never done   Diabetic kidney evaluation - Urine ACR  Never done   Zoster Vaccines- Shingrix (1 of 2) Never done   PAP SMEAR-Modifier  Never done   Colonoscopy  03/21/2021   OPHTHALMOLOGY EXAM  06/30/2022   COVID-19 Vaccine (4 - 2023-24 season) 06/02/2023 (Originally 05/28/2022)   INFLUENZA VACCINE  12/26/2023 (Originally 04/28/2023)    HEMOGLOBIN A1C  07/09/2023   MAMMOGRAM  12/27/2023   Diabetic kidney evaluation - eGFR measurement  01/07/2024   DTaP/Tdap/Td (3 - Td or Tdap) 07/05/2032   Hepatitis C Screening  Completed   HIV Screening  Completed   HPV VACCINES  Aged Out    There are no preventive care reminders to display for this patient.  Lab Results  Component Value Date   TSH 1.920 01/07/2023   Lab Results  Component Value Date   WBC 9.9 01/07/2023   HGB 12.2 01/07/2023   HCT 36.8 01/07/2023   MCV 85 01/07/2023   PLT 317 01/07/2023   Lab Results  Component Value Date   NA 142 01/07/2023   K 4.0 01/07/2023   CO2 23 01/07/2023   GLUCOSE 53 (L) 01/07/2023   BUN 22 01/07/2023   CREATININE 0.79 01/07/2023   BILITOT <0.2 01/07/2023   ALKPHOS 82 01/07/2023   AST 16 01/07/2023   ALT 17 01/07/2023   PROT 6.4 01/07/2023   ALBUMIN 4.3 01/07/2023   CALCIUM 9.6 01/07/2023   ANIONGAP 10 09/17/2018   EGFR 86 01/07/2023   Lab Results  Component Value Date   CHOL 128 01/07/2023   Lab Results  Component Value Date   HDL 44 01/07/2023   Lab Results  Component Value Date   LDLCALC 51 01/07/2023   Lab Results  Component  Value Date   TRIG 203 (H) 01/07/2023   Lab Results  Component Value Date   CHOLHDL 2.9 01/07/2023   Lab Results  Component Value Date   HGBA1C 7.9 (H) 01/07/2023      Assessment & Plan:   Problem List Items Addressed This Visit       Cardiovascular and Mediastinum   Hypertension    Blood pressure 118/78 in the office today. Advised to consume low-salt heart healthy diet. Continue valsartan HCTZ.        Endocrine   Type 2 diabetes mellitus without complication, with long-term current use of insulin (HCC)    Lab Results  Component Value Date   HGBA1C 7.9 (H) 01/07/2023  Advised pt to monitor diet. Advised pt to eat variety of food including fruits, vegetables, whole grains, complex carbohydrates and proteins. Increased Mounjaro to 10 Mg weekly. Will continue to  monitor        Relevant Medications   gabapentin (NEURONTIN) 100 MG capsule   tirzepatide [facility]) 10 MG/0.5ML Pen     Other   Hyperlipidemia - Primary    Lab Results  Component Value Date   CHOL 128 01/07/2023   HDL 44 01/07/2023   LDLCALC 51 01/07/2023   TRIG 203 (H) 01/07/2023   CHOLHDL 2.9 01/07/2023  Advised patient to watch diet and follow regular exercise schedule.        Anxiety and depression    PHQ-9 score 15 and GAD score 9. Started on hydroxyzine. Has family stress at present. If symptoms not improving will start on SSRIs       Relevant Medications   hydrOXYzine (VISTARIL) 25 MG capsule    Meds ordered this encounter  Medications   gabapentin (NEURONTIN) 100 MG capsule    Sig: Take 1 capsule (100 mg total) by mouth 3 (three) times daily.    Dispense:  90 capsule    Refill:  3   magic mouthwash (nystatin, lidocaine, diphenhydrAMINE, alum & mag hydroxide) suspension    Sig: Swish and spit 5 mLs 3 (three) times daily as needed for mouth pain.    Dispense:  180 mL    Refill:  0   tirzepatide (MOUNJARO) 10 MG/0.5ML Pen    Sig: Inject 10 mg into the skin once a week.    Dispense:  6 mL    Refill:  2   hydrOXYzine (VISTARIL) 25 MG capsule    Sig: Take 1 capsule (25 mg total) by mouth every 8 (eight) hours as needed.    Dispense:  30 capsule    Refill:  0     Follow-up: Return in about 1 week (around 05/24/2023) for physical.    Kara Dies, NP

## 2023-05-18 ENCOUNTER — Other Ambulatory Visit: Payer: Self-pay

## 2023-05-25 DIAGNOSIS — F32A Depression, unspecified: Secondary | ICD-10-CM | POA: Insufficient documentation

## 2023-05-25 NOTE — Assessment & Plan Note (Signed)
Lab Results  Component Value Date   HGBA1C 7.9 (H) 01/07/2023  Advised pt to monitor diet. Advised pt to eat variety of food including fruits, vegetables, whole grains, complex carbohydrates and proteins. Increased Mounjaro to 10 Mg weekly. Will continue to monitor

## 2023-05-25 NOTE — Assessment & Plan Note (Signed)
PHQ-9 score 15 and GAD score 9. Started on hydroxyzine. Has family stress at present. If symptoms not improving will start on SSRIs

## 2023-05-25 NOTE — Assessment & Plan Note (Signed)
Lab Results  Component Value Date   CHOL 128 01/07/2023   HDL 44 01/07/2023   LDLCALC 51 01/07/2023   TRIG 203 (H) 01/07/2023   CHOLHDL 2.9 01/07/2023  Advised patient to watch diet and follow regular exercise schedule.

## 2023-05-25 NOTE — Assessment & Plan Note (Signed)
Blood pressure 118/78 in the office today. Advised to consume low-salt heart healthy diet. Continue valsartan HCTZ.

## 2023-05-26 ENCOUNTER — Ambulatory Visit (INDEPENDENT_AMBULATORY_CARE_PROVIDER_SITE_OTHER): Payer: Commercial Managed Care - PPO | Admitting: Nurse Practitioner

## 2023-05-26 ENCOUNTER — Encounter: Payer: Self-pay | Admitting: Nurse Practitioner

## 2023-05-26 ENCOUNTER — Telehealth: Payer: Self-pay | Admitting: *Deleted

## 2023-05-26 VITALS — BP 118/70 | HR 96 | Temp 97.9°F | Ht 58.5 in | Wt 209.0 lb

## 2023-05-26 DIAGNOSIS — Z1231 Encounter for screening mammogram for malignant neoplasm of breast: Secondary | ICD-10-CM | POA: Diagnosis not present

## 2023-05-26 DIAGNOSIS — Z0000 Encounter for general adult medical examination without abnormal findings: Secondary | ICD-10-CM | POA: Diagnosis not present

## 2023-05-26 DIAGNOSIS — R0683 Snoring: Secondary | ICD-10-CM

## 2023-05-26 LAB — LIPID PANEL
Cholesterol: 117 mg/dL (ref 0–200)
HDL: 37.3 mg/dL — ABNORMAL LOW (ref >39.00)
LDL Cholesterol: 54 mg/dL (ref 0–99)
NonHDL: 79.89
Total CHOL/HDL Ratio: 3
Triglycerides: 129 mg/dL (ref 0.0–149.0)
VLDL: 25.8 mg/dL (ref 0.0–40.0)

## 2023-05-26 LAB — COMPREHENSIVE METABOLIC PANEL
ALT: 17 U/L (ref 0–35)
AST: 17 U/L (ref 0–37)
Albumin: 4.2 g/dL (ref 3.5–5.2)
Alkaline Phosphatase: 67 U/L (ref 39–117)
BUN: 22 mg/dL (ref 6–23)
CO2: 26 mEq/L (ref 19–32)
Calcium: 9.6 mg/dL (ref 8.4–10.5)
Chloride: 102 mEq/L (ref 96–112)
Creatinine, Ser: 0.93 mg/dL (ref 0.40–1.20)
GFR: 66.67 mL/min (ref >60.00)
Glucose, Bld: 48 mg/dL — CL (ref 70–99)
Potassium: 3.8 mEq/L (ref 3.5–5.1)
Sodium: 139 mEq/L (ref 135–145)
Total Bilirubin: 0.3 mg/dL (ref 0.2–1.2)
Total Protein: 7.1 g/dL (ref 6.0–8.3)

## 2023-05-26 LAB — HEMOGLOBIN A1C: Hgb A1c MFr Bld: 7.2 % — ABNORMAL HIGH (ref 4.6–6.5)

## 2023-05-26 NOTE — Progress Notes (Signed)
Established Patient Office Visit  Subjective:  Patient ID: Tamara Wells, female    DOB: 04-07-1961  Age: 62 y.o. MRN: [id number]  CC:  Chief Complaint  Patient presents with   Annual Exam    HPI  Tamara Wells presents for her annual physical exam.  Flu: Due Tetanus: 2023 COVID: Pfizer X3 Pap smear: Hysterectomy 20 years ago Dentist: as needed Eye examination: 2024 Exercise: No  Mammogram: Due  Diet: Patient does not eat meat. Patient consumes fruits and veggies. Patient eat some fried food. Patient drinks water, coffee and 1 soda a day  She has an appointment tomorrow with the therapist for her family stressor that is leading to depression and anxiety.   She is followed by GYN Dr. Logan Bores.  She is due for her follow-up.  She states sore at night and would like to get sleep study done and when wakes up in the morning does not fells f penchant reporting that he Magellan lithotripter Hoven rested and sometimes tired during the day time.   HPI   Past Medical History:  Diagnosis Date   Diabetes mellitus without complication (HCC)    Dx 2006   Hyperlipidemia    Hypertension    Sleep apnea    Vertigo    2002    Past Surgical History:  Procedure Laterality Date   ABDOMINAL HYSTERECTOMY     2000   CARPAL TUNNEL RELEASE Left    CARPAL TUNNEL RELEASE Right 05/28/2019   Procedure: CARPAL TUNNEL RELEASE;  Surgeon: Deeann Saint, MD;  Location: [facility];  Service: Orthopedics;  Laterality: Right;   COLONOSCOPY WITH PROPOFOL N/A 03/21/2020   Procedure: COLONOSCOPY WITH PROPOFOL;  Surgeon: Pasty Spillers, MD;  Location: [facility];  Service: Endoscopy;  Laterality: N/A;   TONSILLECTOMY      Family History  Problem Relation Age of Onset   Diabetes Mother    Diabetes Father    Diabetes Maternal Grandmother    Breast cancer Neg Hx     Social History   Socioeconomic History   Marital status: Married    Spouse name: Not on file   Number of children:  Not on file   Years of education: Not on file   Highest education level: Not on file  Occupational History   Not on file  Tobacco Use   Smoking status: Former    Current packs/day: 0.00    Types: Cigarettes    Quit date: 09/27/2004    Years since quitting: 18.6   Smokeless tobacco: Never  Vaping Use   Vaping status: Never Used  Substance and Sexual Activity   Alcohol use: No   Drug use: No   Sexual activity: Not Currently    Birth control/protection: Surgical  Other Topics Concern   Not on file  Social History Narrative   Not on file   Social Determinants of Health   Financial Resource Strain: Not on file  Food Insecurity: Not on file  Transportation Needs: Not on file  Physical Activity: Not on file  Stress: Not on file  Social Connections: Not on file  Intimate Partner Violence: Not on file     Outpatient Medications Prior to Visit  Medication Sig Dispense Refill   albuterol (VENTOLIN HFA) 108 (90 Base) MCG/ACT inhaler Inhale 2 puffs into the lungs every 4 hours as needed for wheezing. 6.7 g 0   aspirin EC 81 MG tablet Take 81 mg by mouth daily.     Black Cohosh 40  MG CAPS Take 40 mg by mouth 2 (two) times daily.     blood glucose meter kit and supplies Dispense based on patient and insurance preference. Use up to four times daily as directed. (FOR ICD-10 E10.9, E11.9). 1 each 0   Blood Pressure Monitoring (OMRON 3 SERIES BP MONITOR) DEVI use to check blood pressure 1 each 0   Crisaborole (EUCRISA) 2 % OINT Apply to affected skin on the breast in the morning 60 g 2   fluconazole (DIFLUCAN) 200 MG tablet Take 1 tablet (200 mg total) by mouth every Monday, Wednesday, and Friday. 12 tablet 1   fluticasone (FLONASE) 50 MCG/ACT nasal spray Place 2 sprays into both nostrils daily. 16 g 6   furosemide (LASIX) 20 MG tablet Take 1 tablet (20 mg total) by mouth every other day. 30 tablet 3   gabapentin (NEURONTIN) 100 MG capsule Take 1 capsule (100 mg total) by mouth 3 (three)  times daily. 90 capsule 3   glipiZIDE (GLUCOTROL) 10 MG tablet TAKE 1 TABLET BY MOUTH TWICE DAILY 180 tablet 5   glucose blood test strip Test as directed up to 4 times a day 100 each 0   hydrOXYzine (VISTARIL) 25 MG capsule Take 1 capsule (25 mg total) by mouth every 8 (eight) hours as needed. 30 capsule 0   insulin glargine-yfgn (SEMGLEE, YFGN,) 100 UNIT/ML Pen Inject 40 Units into the skin daily. 15 mL 5   ketoconazole (NIZORAL) 2 % cream Apply 1 Application topically 2 (two) times daily under the breast. 60 g 5   Lancets (FREESTYLE) lancets Test up to 4 times a day as directed 100 each 0   latanoprost (XALATAN) 0.005 % ophthalmic solution instill 1 drop into affected eye(s) once daily in the evening 5 mL 3   latanoprost (XALATAN) 0.005 % ophthalmic solution Place 1 drop into both eyes daily in the evening. 2.5 mL 0   latanoprost (XALATAN) 0.005 % ophthalmic solution Place 1 drop into affected eyes daily in the evening. 5 mL 1   magic mouthwash (nystatin, lidocaine, diphenhydrAMINE, alum & mag hydroxide) suspension Swish and spit 5 mLs 3 (three) times daily as needed for mouth pain. 180 mL 0   meclizine (ANTIVERT) 25 MG tablet Take 25 mg by mouth 3 (three) times daily as needed for dizziness.      meloxicam (MOBIC) 15 MG tablet Take 1 tablet (15 mg total) by mouth daily with meal. 90 tablet 2   metFORMIN (GLUCOPHAGE) 1000 MG tablet Take 1 tablet (1,000 mg total) by mouth 2 (two) times daily. 180 tablet 2   mometasone (ELOCON) 0.1 % cream Apply 1 Application topically 2 (two) times daily under breast 3 times a week. 45 g 5   Multiple Vitamins-Minerals (CENTRAL-VITE PO) Take 1 tablet by mouth daily.     rosuvastatin (CRESTOR) 10 MG tablet Take 1 tablet (10 mg total) by mouth daily. 90 tablet 3   tirzepatide (MOUNJARO) 10 MG/0.5ML Pen Inject 10 mg into the skin once a week. 6 mL 2   valsartan-hydrochlorothiazide (DIOVAN-HCT) 80-12.5 MG tablet Take 1 tablet by mouth daily. 90 tablet 3   No  facility-administered medications prior to visit.    Allergies  Allergen Reactions   No Known Allergies     ROS Review of Systems Negative unless indicated in HPI.    Objective:    Physical Exam Constitutional:      Appearance: Normal appearance. She is normal weight.  HENT:     Head: Normocephalic.  Right Ear: Tympanic membrane normal.     Left Ear: Tympanic membrane normal.     Nose: Nose normal.     Mouth/Throat:     Mouth: Mucous membranes are moist.  Eyes:     Extraocular Movements: Extraocular movements intact.     Conjunctiva/sclera: Conjunctivae normal.     Pupils: Pupils are equal, round, and reactive to light.  Neck:     Thyroid: No thyroid mass or thyroid tenderness.  Cardiovascular:     Rate and Rhythm: Normal rate and regular rhythm.     Pulses: Normal pulses.     Heart sounds: Normal heart sounds. No murmur heard. Pulmonary:     Effort: Pulmonary effort is normal.     Breath sounds: Normal breath sounds.  Abdominal:     General: Bowel sounds are normal.     Palpations: Abdomen is soft. There is no mass.     Tenderness: There is no abdominal tenderness. There is no rebound.  Musculoskeletal:        General: No swelling.     Cervical back: Neck supple. No tenderness.     Right lower leg: No edema.     Left lower leg: No edema.  Skin:    Findings: No bruising, erythema or rash.  Neurological:     General: No focal deficit present.     Mental Status: She is alert and oriented to person, place, and time. Mental status is at baseline.  Psychiatric:        Mood and Affect: Mood normal.        Behavior: Behavior normal.        Thought Content: Thought content normal.        Judgment: Judgment normal.     BP 118/70   Pulse 96   Temp 97.9 F (36.6 C) (Oral)   Ht 4' 10.5" (1.486 m)   Wt 209 lb (94.8 kg)   BMI 42.94 kg/m  Wt Readings from Last 3 Encounters:  05/26/23 209 lb (94.8 kg)  05/17/23 212 lb (96.2 kg)  01/07/23 213 lb (96.6 kg)      Health Maintenance  Topic Date Due   FOOT EXAM  Never done   Diabetic kidney evaluation - Urine ACR  Never done   Zoster Vaccines- Shingrix (1 of 2) Never done   PAP SMEAR-Modifier  Never done   Colonoscopy  03/21/2021   OPHTHALMOLOGY EXAM  06/30/2022   COVID-19 Vaccine (4 - 2023-24 season) 05/29/2023   INFLUENZA VACCINE  12/26/2023 (Originally 04/28/2023)   HEMOGLOBIN A1C  11/25/2023   MAMMOGRAM  12/27/2023   Diabetic kidney evaluation - eGFR measurement  05/25/2024   DTaP/Tdap/Td (3 - Td or Tdap) 07/05/2032   Hepatitis C Screening  Completed   HIV Screening  Completed   HPV VACCINES  Aged Out    There are no preventive care reminders to display for this patient.  Lab Results  Component Value Date   TSH 1.920 01/07/2023   Lab Results  Component Value Date   WBC 9.9 01/07/2023   HGB 12.2 01/07/2023   HCT 36.8 01/07/2023   MCV 85 01/07/2023   PLT 317 01/07/2023   Lab Results  Component Value Date   NA 139 05/26/2023   K 3.8 05/26/2023   CO2 26 05/26/2023   GLUCOSE 48 (LL) 05/26/2023   BUN 22 05/26/2023   CREATININE 0.93 05/26/2023   BILITOT 0.3 05/26/2023   ALKPHOS 67 05/26/2023   AST 17 05/26/2023   ALT 17  05/26/2023   PROT 7.1 05/26/2023   ALBUMIN 4.2 05/26/2023   CALCIUM 9.6 05/26/2023   ANIONGAP 10 09/17/2018   EGFR 86 01/07/2023   GFR 66.67 05/26/2023   Lab Results  Component Value Date   CHOL 117 05/26/2023   Lab Results  Component Value Date   HDL 37.30 (L) 05/26/2023   Lab Results  Component Value Date   LDLCALC 54 05/26/2023   Lab Results  Component Value Date   TRIG 129.0 05/26/2023   Lab Results  Component Value Date   CHOLHDL 3 05/26/2023   Lab Results  Component Value Date   HGBA1C 7.2 (H) 05/26/2023      Assessment & Plan:  Annual physical exam Assessment & Plan: Encouraged patient to consume a balanced diet and regular exercise regimen. Advised to see an eye doctor and dentist annually.  Flu vaccine and mammogram  due.  Orders: -     Lipid panel -     Hemoglobin A1c -     Comprehensive metabolic panel  Snoring Assessment & Plan: Referral sent for sleep study  Orders: -     Ambulatory referral to Pulmonology  Screening mammogram, encounter for -     3D Screening Mammogram, Left and Right; Future    Follow-up: Return in about 4 months (around 09/25/2023) for chronic management.   Kara Dies, NP

## 2023-05-26 NOTE — Progress Notes (Signed)
Please call pt and check how is she doing?Her fasting BS was very low. Electrolytes, Liver  and kidney function normal. Lipid overall good, with lower HDL. HgA1c decreased  to 7.2

## 2023-05-26 NOTE — Telephone Encounter (Signed)
CRITICAL VALUE STICKER  CRITICAL VALUE: Glucose-48 (L)  RECEIVER (on-site recipient of call): Silvestre Moment, CMA  DATE & TIME NOTIFIED: 05/26/23 @ 2:40pm  MESSENGER (representative from lab): Allene Pyo  MD NOTIFIED: Kara Dies, NP  TIME OF NOTIFICATION: 2:50pm  RESPONSE:

## 2023-05-26 NOTE — Patient Instructions (Signed)
Please go to the lab for blood work. Referral sent for sleep study. YOUR MAMMOGRAM IS DUE, PLEASE CALL AND GET THIS SCHEDULED! [facility] - call [phone]

## 2023-05-26 NOTE — Telephone Encounter (Signed)
Please call pt

## 2023-05-26 NOTE — Telephone Encounter (Signed)
Spoke to pt, pt stated she is feeling fine. Informed pt to keep a record of her sugars and to let us know what her readings are for the next couple of days

## 2023-05-29 ENCOUNTER — Other Ambulatory Visit: Payer: Self-pay

## 2023-05-31 DIAGNOSIS — R0683 Snoring: Secondary | ICD-10-CM | POA: Insufficient documentation

## 2023-05-31 NOTE — Assessment & Plan Note (Addendum)
Encouraged patient to consume a balanced diet and regular exercise regimen. Advised to see an eye doctor and dentist annually.  Flu vaccine and mammogram due.

## 2023-06-04 NOTE — Assessment & Plan Note (Signed)
Referral sent for sleep study.  

## 2023-06-06 ENCOUNTER — Other Ambulatory Visit: Payer: Self-pay

## 2023-06-08 ENCOUNTER — Other Ambulatory Visit: Payer: Self-pay | Admitting: Dermatology

## 2023-06-08 ENCOUNTER — Other Ambulatory Visit: Payer: Self-pay

## 2023-06-08 MED ORDER — EUCRISA 2 % EX OINT
1.0000 | TOPICAL_OINTMENT | Freq: Every morning | CUTANEOUS | 2 refills | Status: DC
  Filled 2023-06-08: qty 60, 30d supply, fill #0
  Filled 2023-07-08: qty 60, 30d supply, fill #1
  Filled 2023-08-11: qty 60, 30d supply, fill #2

## 2023-06-09 ENCOUNTER — Other Ambulatory Visit: Payer: Self-pay

## 2023-06-10 ENCOUNTER — Other Ambulatory Visit: Payer: Self-pay

## 2023-06-13 ENCOUNTER — Other Ambulatory Visit: Payer: Self-pay

## 2023-06-16 ENCOUNTER — Ambulatory Visit: Payer: Commercial Managed Care - PPO | Admitting: Dermatology

## 2023-06-22 ENCOUNTER — Other Ambulatory Visit: Payer: Self-pay

## 2023-06-23 ENCOUNTER — Telehealth: Payer: Self-pay | Admitting: Nurse Practitioner

## 2023-06-23 ENCOUNTER — Other Ambulatory Visit: Payer: Self-pay

## 2023-06-23 ENCOUNTER — Encounter: Payer: Self-pay | Admitting: Nurse Practitioner

## 2023-06-23 ENCOUNTER — Ambulatory Visit: Payer: Commercial Managed Care - PPO | Admitting: Nurse Practitioner

## 2023-06-23 VITALS — BP 130/86 | HR 100 | Temp 97.9°F | Ht 58.5 in | Wt 204.8 lb

## 2023-06-23 DIAGNOSIS — J0140 Acute pansinusitis, unspecified: Secondary | ICD-10-CM

## 2023-06-23 MED ORDER — VALSARTAN-HYDROCHLOROTHIAZIDE 80-12.5 MG PO TABS
1.0000 | ORAL_TABLET | Freq: Every day | ORAL | 3 refills | Status: DC
  Filled 2023-06-23: qty 90, 90d supply, fill #0
  Filled 2023-09-19: qty 90, 90d supply, fill #1
  Filled 2023-11-25: qty 90, 90d supply, fill #2
  Filled 2024-03-31: qty 90, 90d supply, fill #3

## 2023-06-23 MED ORDER — BENZONATATE 100 MG PO CAPS
100.0000 mg | ORAL_CAPSULE | Freq: Three times a day (TID) | ORAL | 0 refills | Status: DC | PRN
  Filled 2023-06-23: qty 30, 10d supply, fill #0

## 2023-06-23 MED ORDER — PREDNISONE 20 MG PO TABS
40.0000 mg | ORAL_TABLET | Freq: Every day | ORAL | 0 refills | Status: DC
Start: 2023-06-23 — End: 2023-06-23
  Filled 2023-06-23: qty 5, 2d supply, fill #0

## 2023-06-23 MED ORDER — AZITHROMYCIN 250 MG PO TABS
ORAL_TABLET | ORAL | 0 refills | Status: AC
  Filled 2023-06-23: qty 6, 5d supply, fill #0

## 2023-06-23 MED ORDER — PREDNISONE 20 MG PO TABS
40.0000 mg | ORAL_TABLET | Freq: Every day | ORAL | 0 refills | Status: AC
  Filled 2023-06-23: qty 10, 5d supply, fill #0

## 2023-06-23 NOTE — Telephone Encounter (Signed)
Prescription Request  06/23/2023  LOV: 06/23/2023  What is the name of the medication or equipment? valsartan  Have you contacted your pharmacy to request a refill? Yes   Which pharmacy would you like this sent to?  [facility] [address] [place] [address] Phone: [phone] Fax: [phone]    Patient notified that their request is being sent to the clinical staff for review and that they should receive a response within 2 business days.   Please advise at Mobile [phone] (mobile)

## 2023-06-23 NOTE — Telephone Encounter (Signed)
Prescription sent to Pharmacy.  

## 2023-06-23 NOTE — Patient Instructions (Signed)
Increase fluid intake and rest. Rx sent to pharmacy  Sinus Infection, Adult A sinus infection, also called sinusitis, is inflammation of your sinuses. Sinuses are hollow spaces in the bones around your face. Your sinuses are located: Around your eyes. In the middle of your forehead. Behind your nose. In your cheekbones. Mucus normally drains out of your sinuses. When your nasal tissues become inflamed or swollen, mucus can become trapped or blocked. This allows bacteria, viruses, and fungi to grow, which leads to infection. Most infections of the sinuses are caused by a virus. A sinus infection can develop quickly. It can last for up to 4 weeks (acute) or for more than 12 weeks (chronic). A sinus infection often develops after a cold. What are the causes? This condition is caused by anything that creates swelling in the sinuses or stops mucus from draining. This includes: Allergies. Asthma. Infection from bacteria or viruses. Deformities or blockages in your nose or sinuses. Abnormal growths in the nose (nasal polyps). Pollutants, such as chemicals or irritants in the air. Infection from fungi. This is rare. What increases the risk? You are more likely to develop this condition if you: Have a weak body defense system (immune system). Do a lot of swimming or diving. Overuse nasal sprays. Smoke. What are the signs or symptoms? The main symptoms of this condition are pain and a feeling of pressure around the affected sinuses. Other symptoms include: Stuffy nose or congestion that makes it difficult to breathe through your nose. Thick yellow or greenish drainage from your nose. Tenderness, swelling, and warmth over the affected sinuses. A cough that may get worse at night. Decreased sense of smell and taste. Extra mucus that collects in the throat or the back of the nose (postnasal drip) causing a sore throat or bad breath. Tiredness (fatigue). Fever. How is this diagnosed? This  condition is diagnosed based on: Your symptoms. Your medical history. A physical exam. Tests to find out if your condition is acute or chronic. This may include: Checking your nose for nasal polyps. Viewing your sinuses using a device that has a light (endoscope). Testing for allergies or bacteria. Imaging tests, such as an MRI or CT scan. In rare cases, a bone biopsy may be done to rule out more serious types of fungal sinus disease. How is this treated? Treatment for a sinus infection depends on the cause and whether your condition is chronic or acute. If caused by a virus, your symptoms should go away on their own within 10 days. You may be given medicines to relieve symptoms. They include: Medicines that shrink swollen nasal passages (decongestants). A spray that eases inflammation of the nostrils (topical intranasal corticosteroids). Rinses that help get rid of thick mucus in your nose (nasal saline washes). Medicines that treat allergies (antihistamines). Over-the-counter pain relievers. If caused by bacteria, your health care provider may recommend waiting to see if your symptoms improve. Most bacterial infections will get better without antibiotic medicine. You may be given antibiotics if you have: A severe infection. A weak immune system. If caused by narrow nasal passages or nasal polyps, surgery may be needed. Follow these instructions at home: Medicines Take, use, or apply over-the-counter and prescription medicines only as told by your health care provider. These may include nasal sprays. If you were prescribed an antibiotic medicine, take it as told by your health care provider. Do not stop taking the antibiotic even if you start to feel better. Hydrate and humidify  Drink enough fluid  to keep your urine pale yellow. Staying hydrated will help to thin your mucus. Use a cool mist humidifier to keep the humidity level in your home above 50%. Inhale steam for 10-15 minutes,  3-4 times a day, or as told by your health care provider. You can do this in the bathroom while a hot shower is running. Limit your exposure to cool or dry air. Rest Rest as much as possible. Sleep with your head raised (elevated). Make sure you get enough sleep each night. General instructions  Apply a warm, moist washcloth to your face 3-4 times a day or as told by your health care provider. This will help with discomfort. Use nasal saline washes as often as told by your health care provider. Wash your hands often with soap and water to reduce your exposure to germs. If soap and water are not available, use hand sanitizer. Do not smoke. Avoid being around people who are smoking (secondhand smoke). Keep all follow-up visits. This is important. Contact a health care provider if: You have a fever. Your symptoms get worse. Your symptoms do not improve within 10 days. Get help right away if: You have a severe headache. You have persistent vomiting. You have severe pain or swelling around your face or eyes. You have vision problems. You develop confusion. Your neck is stiff. You have trouble breathing. These symptoms may be an emergency. Get help right away. Call 911. Do not wait to see if the symptoms will go away. Do not drive yourself to the hospital. Summary A sinus infection is soreness and inflammation of your sinuses. Sinuses are hollow spaces in the bones around your face. This condition is caused by nasal tissues that become inflamed or swollen. The swelling traps or blocks the flow of mucus. This allows bacteria, viruses, and fungi to grow, which leads to infection. If you were prescribed an antibiotic medicine, take it as told by your health care provider. Do not stop taking the antibiotic even if you start to feel better. Keep all follow-up visits. This is important. This information is not intended to replace advice given to you by your health care provider. Make sure you  discuss any questions you have with your health care provider. Document Revised: 08/18/2021 Document Reviewed: 08/18/2021 Elsevier Patient Education  2024 [employer]. .

## 2023-06-24 ENCOUNTER — Encounter: Payer: Self-pay | Admitting: Nurse Practitioner

## 2023-06-24 DIAGNOSIS — J0140 Acute pansinusitis, unspecified: Secondary | ICD-10-CM | POA: Insufficient documentation

## 2023-06-24 NOTE — Assessment & Plan Note (Signed)
Frontal and maxillary sinus tenderness.  No adventitious sounds to auscultation. Will treat with Z-Pak, prednisone and Tessalon Perles for cough. Advised to increase fluid intake and use steam or humidifier. If symptoms not improving call the office back.

## 2023-07-29 ENCOUNTER — Other Ambulatory Visit: Payer: Self-pay

## 2023-07-29 ENCOUNTER — Other Ambulatory Visit: Payer: Self-pay | Admitting: Nurse Practitioner

## 2023-07-29 MED ORDER — INSULIN GLARGINE-YFGN 100 UNIT/ML ~~LOC~~ SOPN
40.0000 [IU] | PEN_INJECTOR | Freq: Every day | SUBCUTANEOUS | 5 refills | Status: DC
  Filled 2023-07-29: qty 15, 37d supply, fill #0
  Filled 2023-09-11: qty 15, 37d supply, fill #1
  Filled 2023-10-27: qty 15, 37d supply, fill #2
  Filled 2023-12-06: qty 15, 37d supply, fill #3

## 2023-07-29 MED FILL — Furosemide Tab 20 MG: ORAL | 60 days supply | Qty: 30 | Fill #2 | Status: AC

## 2023-08-05 ENCOUNTER — Ambulatory Visit (INDEPENDENT_AMBULATORY_CARE_PROVIDER_SITE_OTHER): Payer: Commercial Managed Care - PPO | Admitting: Nurse Practitioner

## 2023-08-05 ENCOUNTER — Encounter: Payer: Self-pay | Admitting: Nurse Practitioner

## 2023-08-05 VITALS — BP 126/80 | HR 87 | Temp 97.1°F | Ht 58.5 in | Wt 208.8 lb

## 2023-08-05 DIAGNOSIS — G4733 Obstructive sleep apnea (adult) (pediatric): Secondary | ICD-10-CM | POA: Insufficient documentation

## 2023-08-05 DIAGNOSIS — R0683 Snoring: Secondary | ICD-10-CM | POA: Insufficient documentation

## 2023-08-05 DIAGNOSIS — Z6841 Body Mass Index (BMI) 40.0 and over, adult: Secondary | ICD-10-CM

## 2023-08-05 NOTE — Progress Notes (Signed)
@Patient  ID: Tamara Wells, female    DOB: 1961/07/20, 62 y.o.   MRN: [id number]  Chief Complaint  Patient presents with   Consult    Snoring.     Referring provider: Kara Dies, NP  HPI: 62 year old female, former smoker referred for sleep consult. Past medical history HTN, DM, anxiety, depression, obesity.   TEST/EVENTS:   08/05/2023: Today - sleep consult Patient presents today for sleep consult. She had a sleep study many years ago.  She was told that she needed a CPAP but was unable to afford this at this time.  She has since had a job change and got new insurance.  She has issues with loud snoring and restless sleep.  She does have some daytime tiredness but is more so after she works her 12-hour shift.  She has frequent daytime headaches.  No issues with drowsy driving, sleep parasomnia/paralysis.  No history of narcolepsy or symptoms of cataplexy. She goes to bed around 9:30 PM.  Falls asleep within 30 minutes.  Wakes once a night.  Gets up around 5:15 AM.  Does not operate any heavy machinery in her job Animal nutritionist.  Weight has fluctuated 10 pounds over the last 2 years.  Previous sleep study records not available.  Does not use any supplemental oxygen. She has a history of high blood pressure, diabetes well-controlled on current medication regimen.  No history of stroke. She is a former smoker.  Quit in 2006.  Seldomly drinks alcohol.  She usually drinks 3 cups of coffee a day.  Lives by herself.  Works as a Advertising copywriter.  Has 3 adult children.  Family history of cancer.  Epworth 4  Allergies  Allergen Reactions   No Known Allergies     Immunization History  Administered Date(s) Administered   Influenza-Unspecified 07/19/2016, 06/29/2021, 06/20/2022, 07/04/2023   PFIZER Comirnaty(Gray Top)Covid-19 Tri-Sucrose Vaccine 09/24/2019, 10/15/2019   Pfizer Covid-19 Vaccine Bivalent Booster 5y-11y 07/11/2020   Tdap 09/24/2019, 07/05/2022    Past Medical History:  Diagnosis  Date   Diabetes mellitus without complication (HCC)    Dx 2006   Hyperlipidemia    Hypertension    Sleep apnea    Vertigo    2002    Tobacco History: Social History   Tobacco Use  Smoking Status Former   Current packs/day: 0.00   Types: Cigarettes   Quit date: 09/27/2004   Years since quitting: 18.8  Smokeless Tobacco Never   Counseling given: Not Answered   Outpatient Medications Prior to Visit  Medication Sig Dispense Refill   albuterol (VENTOLIN HFA) 108 (90 Base) MCG/ACT inhaler Inhale 2 puffs into the lungs every 4 hours as needed for wheezing. 6.7 g 0   aspirin EC 81 MG tablet Take 81 mg by mouth daily.     benzonatate (TESSALON PERLES) 100 MG capsule Take 1 capsule (100 mg total) by mouth 3 (three) times daily as needed. 30 capsule 0   Black Cohosh 40 MG CAPS Take 40 mg by mouth 2 (two) times daily.     blood glucose meter kit and supplies Dispense based on patient and insurance preference. Use up to four times daily as directed. (FOR ICD-10 E10.9, E11.9). 1 each 0   Blood Pressure Monitoring (OMRON 3 SERIES BP MONITOR) DEVI use to check blood pressure 1 each 0   Crisaborole (EUCRISA) 2 % OINT Apply 1 Application topically to affected skin on breast every morning. 60 g 2   fluconazole (DIFLUCAN) 200 MG tablet Take  1 tablet (200 mg total) by mouth every Monday, Wednesday, and Friday. 12 tablet 1   fluticasone (FLONASE) 50 MCG/ACT nasal spray Place 2 sprays into both nostrils daily. 16 g 6   furosemide (LASIX) 20 MG tablet Take 1 tablet (20 mg total) by mouth every other day. 30 tablet 3   gabapentin (NEURONTIN) 100 MG capsule Take 1 capsule (100 mg total) by mouth 3 (three) times daily. 90 capsule 3   glipiZIDE (GLUCOTROL) 10 MG tablet TAKE 1 TABLET BY MOUTH TWICE DAILY 180 tablet 5   glucose blood test strip Test as directed up to 4 times a day 100 each 0   hydrOXYzine (VISTARIL) 25 MG capsule Take 1 capsule (25 mg total) by mouth every 8 (eight) hours as needed. 30  capsule 0   insulin glargine-yfgn (SEMGLEE, YFGN,) 100 UNIT/ML Pen Inject 40 Units into the skin daily. 15 mL 5   ketoconazole (NIZORAL) 2 % cream Apply 1 Application topically 2 (two) times daily under the breast. 60 g 5   Lancets (FREESTYLE) lancets Test up to 4 times a day as directed 100 each 0   latanoprost (XALATAN) 0.005 % ophthalmic solution instill 1 drop into affected eye(s) once daily in the evening 5 mL 3   latanoprost (XALATAN) 0.005 % ophthalmic solution Place 1 drop into both eyes daily in the evening. 2.5 mL 0   latanoprost (XALATAN) 0.005 % ophthalmic solution Place 1 drop into affected eyes daily in the evening. 5 mL 1   magic mouthwash (nystatin, lidocaine, diphenhydrAMINE, alum & mag hydroxide) suspension Swish and spit 5 mLs 3 (three) times daily as needed for mouth pain. 180 mL 0   meclizine (ANTIVERT) 25 MG tablet Take 25 mg by mouth 3 (three) times daily as needed for dizziness.      meloxicam (MOBIC) 15 MG tablet Take 1 tablet (15 mg total) by mouth daily with meal. 90 tablet 2   metFORMIN (GLUCOPHAGE) 1000 MG tablet Take 1 tablet (1,000 mg total) by mouth 2 (two) times daily. 180 tablet 2   mometasone (ELOCON) 0.1 % cream Apply 1 Application topically 2 (two) times daily under breast 3 times a week. 45 g 5   Multiple Vitamins-Minerals (CENTRAL-VITE PO) Take 1 tablet by mouth daily.     rosuvastatin (CRESTOR) 10 MG tablet Take 1 tablet (10 mg total) by mouth daily. 90 tablet 3   tirzepatide (MOUNJARO) 10 MG/0.5ML Pen Inject 10 mg into the skin once a week. 6 mL 2   valsartan-hydrochlorothiazide (DIOVAN-HCT) 80-12.5 MG tablet Take 1 tablet by mouth daily. 90 tablet 3   No facility-administered medications prior to visit.     Review of Systems:   Constitutional: No weight loss or gain, night sweats, fevers, chills, or lassitude. +occasional fatigue  HEENT: No difficulty swallowing, tooth/dental problems, or sore throat. No sneezing, itching, ear ache, nasal congestion,  or post nasal drip. +frequent headaches  CV:  No chest pain, orthopnea, PND, swelling in lower extremities, anasarca, dizziness, palpitations, syncope Resp: +snoring; baseline shortness of breath with exertion. No excess mucus or change in color of mucus. No productive or non-productive. No hemoptysis. No wheezing.  No chest wall deformity GI:  No heartburn, indigestion GU: No dysuria, change in color of urine, urgency or frequency.   Skin: No rash, lesions, ulcerations MSK:  No joint pain or swelling.  Neuro: No dizziness or lightheadedness.  Psych: +stable depression, anxiety. No SI/HI. Mood stable. +sleep disturbance     Physical Exam:  BP  126/80 (BP Location: Right Arm, Cuff Size: Large)   Pulse 87   Temp (!) 97.1 F (36.2 C)   Ht 4' 10.5" (1.486 m)   Wt 208 lb 12.8 oz (94.7 kg)   SpO2 97%   BMI 42.90 kg/m   GEN: Pleasant, interactive, well-appearing; obese; in no acute distress HEENT:  Normocephalic and atraumatic. PERRLA. Sclera white. Nasal turbinates pink, moist and patent bilaterally. No rhinorrhea present. Oropharynx pink and moist, without exudate or edema. No lesions, ulcerations, or postnasal drip. Mallampati III NECK:  Supple w/ fair ROM. No JVD present. Normal carotid impulses w/o bruits. Thyroid symmetrical with no goiter or nodules palpated. No lymphadenopathy.   CV: RRR, no m/r/g, no peripheral edema. Pulses intact, +2 bilaterally. No cyanosis, pallor or clubbing. PULMONARY:  Unlabored, regular breathing. Clear bilaterally A&P w/o wheezes/rales/rhonchi. No accessory muscle use.  GI: BS present and normoactive. Soft, non-tender to palpation. No organomegaly or masses detected.  MSK: No erythema, warmth or tenderness. Cap refil <2 sec all extrem. No deformities or joint swelling noted.  Neuro: A/Ox3. No focal deficits noted.   Skin: Warm, no lesions or rashe Psych: Normal affect and behavior. Judgement and thought content appropriate.     Lab Results:  CBC     Component Value Date/Time   WBC 9.9 01/07/2023 1602   WBC 7.1 08/04/2021 1043   RBC 4.33 01/07/2023 1602   RBC 4.38 08/04/2021 1043   HGB 12.2 01/07/2023 1602   HCT 36.8 01/07/2023 1602   PLT 317 01/07/2023 1602   MCV 85 01/07/2023 1602   MCH 28.2 01/07/2023 1602   MCH 29.0 08/04/2021 1043   MCHC 33.2 01/07/2023 1602   MCHC 33.4 08/04/2021 1043   RDW 14.0 01/07/2023 1602   LYMPHSABS 4.3 (H) 01/07/2023 1602   EOSABS 0.0 01/07/2023 1602   BASOSABS 0.0 01/07/2023 1602    BMET    Component Value Date/Time   NA 139 05/26/2023 1006   NA 142 01/07/2023 1602   K 3.8 05/26/2023 1006   CL 102 05/26/2023 1006   CO2 26 05/26/2023 1006   GLUCOSE 48 (LL) 05/26/2023 1006   BUN 22 05/26/2023 1006   BUN 22 01/07/2023 1602   CREATININE 0.93 05/26/2023 1006   CREATININE 0.62 08/04/2021 1043   CALCIUM 9.6 05/26/2023 1006   GFRNONAA 99 02/21/2020 0952   GFRAA 115 02/21/2020 0952    BNP No results found for: "BNP"   Imaging:  No results found.  Administration History     None           No data to display          No results found for: "NITRICOXIDE"      Assessment & Plan:   OSA (obstructive sleep apnea) History of sleep apnea. Records unavailable. She has snoring, occasional daytime sleepiness, restless sleep, frequent headaches. BMI 42. History of HTN, DM. Given this,  I am concerned she could have sleep disordered breathing with obstructive sleep apnea. She will need sleep study for further evaluation.    - discussed how weight can impact sleep and risk for sleep disordered breathing - discussed options to assist with weight loss: combination of diet modification, cardiovascular and strength training exercises   - had an extensive discussion regarding the adverse health consequences related to untreated sleep disordered breathing - specifically discussed the risks for hypertension, coronary artery disease, cardiac dysrhythmias, cerebrovascular disease, and  diabetes - lifestyle modification discussed   - discussed how sleep disruption can increase risk  of accidents, particularly when driving - safe driving practices were discussed  Patient Instructions  Given your symptoms, I am concerned that you may have sleep disordered breathing with sleep apnea. You will need a sleep study for further evaluation. Someone will contact you to schedule this.   We discussed how untreated sleep apnea puts an individual at risk for cardiac arrhthymias, pulm HTN, DM, stroke and increases their risk for daytime accidents. We also briefly reviewed treatment options including weight loss, side sleeping position, oral appliance, CPAP therapy or referral to ENT for possible surgical options  Use caution when driving and pull over if you become sleepy.  Follow up in 6-8 weeks with Florentina Addison Mersadez Linden,NP to go over sleep study results, or sooner, if needed     Morbid obesity with BMI of 40.0-44.9, adult (HCC) BMI 42. Healthy weight loss encouraged.   Loud snoring See above  Advised if symptoms do not improve or worsen, to please contact office for sooner follow up or seek emergency care.   I spent 35 minutes of dedicated to the care of this patient on the date of this encounter to include pre-visit review of records, face-to-face time with the patient discussing conditions above, post visit ordering of testing, clinical documentation with the electronic health record, making appropriate referrals as documented, and communicating necessary findings to members of the patients care team.  Noemi Chapel, NP 08/05/2023  Pt aware and understands NP's role.

## 2023-08-05 NOTE — Patient Instructions (Signed)
Given your symptoms, I am concerned that you may have sleep disordered breathing with sleep apnea. You will need a sleep study for further evaluation. Someone will contact you to schedule this.   We discussed how untreated sleep apnea puts an individual at risk for cardiac arrhthymias, pulm HTN, DM, stroke and increases their risk for daytime accidents. We also briefly reviewed treatment options including weight loss, side sleeping position, oral appliance, CPAP therapy or referral to ENT for possible surgical options  Use caution when driving and pull over if you become sleepy.  Follow up in 6-8 weeks with Katie Gerri Acre,NP to go over sleep study results, or sooner, if needed

## 2023-08-05 NOTE — Assessment & Plan Note (Signed)
See above

## 2023-08-05 NOTE — Assessment & Plan Note (Addendum)
History of sleep apnea. Records unavailable. She has snoring, occasional daytime sleepiness, restless sleep, frequent headaches. BMI 42. History of HTN, DM. Given this,  I am concerned she could have sleep disordered breathing with obstructive sleep apnea. She will need sleep study for further evaluation.    - discussed how weight can impact sleep and risk for sleep disordered breathing - discussed options to assist with weight loss: combination of diet modification, cardiovascular and strength training exercises   - had an extensive discussion regarding the adverse health consequences related to untreated sleep disordered breathing - specifically discussed the risks for hypertension, coronary artery disease, cardiac dysrhythmias, cerebrovascular disease, and diabetes - lifestyle modification discussed   - discussed how sleep disruption can increase risk of accidents, particularly when driving - safe driving practices were discussed  Patient Instructions  Given your symptoms, I am concerned that you may have sleep disordered breathing with sleep apnea. You will need a sleep study for further evaluation. Someone will contact you to schedule this.   We discussed how untreated sleep apnea puts an individual at risk for cardiac arrhthymias, pulm HTN, DM, stroke and increases their risk for daytime accidents. We also briefly reviewed treatment options including weight loss, side sleeping position, oral appliance, CPAP therapy or referral to ENT for possible surgical options  Use caution when driving and pull over if you become sleepy.  Follow up in 6-8 weeks with Tamara Ameirah Khatoon,NP to go over sleep study results, or sooner, if needed

## 2023-08-05 NOTE — Assessment & Plan Note (Signed)
BMI 42. Healthy weight loss encouraged.

## 2023-08-11 ENCOUNTER — Other Ambulatory Visit: Payer: Self-pay

## 2023-08-11 DIAGNOSIS — G4733 Obstructive sleep apnea (adult) (pediatric): Secondary | ICD-10-CM

## 2023-08-11 DIAGNOSIS — R0683 Snoring: Secondary | ICD-10-CM

## 2023-08-11 MED FILL — Metformin HCl Tab 1000 MG: ORAL | 90 days supply | Qty: 180 | Fill #2 | Status: AC

## 2023-08-11 MED FILL — Glipizide Tab 10 MG: ORAL | 90 days supply | Qty: 180 | Fill #2 | Status: AC

## 2023-09-11 ENCOUNTER — Other Ambulatory Visit: Payer: Self-pay | Admitting: Dermatology

## 2023-09-11 ENCOUNTER — Other Ambulatory Visit: Payer: Self-pay

## 2023-09-11 DIAGNOSIS — L304 Erythema intertrigo: Secondary | ICD-10-CM

## 2023-09-12 ENCOUNTER — Other Ambulatory Visit: Payer: Self-pay

## 2023-09-12 MED FILL — Crisaborole Oint 2%: CUTANEOUS | 30 days supply | Qty: 60 | Fill #0 | Status: AC

## 2023-09-13 ENCOUNTER — Other Ambulatory Visit: Payer: Self-pay

## 2023-09-23 ENCOUNTER — Other Ambulatory Visit: Payer: Self-pay

## 2023-09-23 MED FILL — Furosemide Tab 20 MG: ORAL | 60 days supply | Qty: 30 | Fill #3 | Status: AC

## 2023-09-29 ENCOUNTER — Ambulatory Visit: Payer: Commercial Managed Care - PPO | Admitting: Nurse Practitioner

## 2023-09-29 ENCOUNTER — Other Ambulatory Visit: Payer: Self-pay

## 2023-09-29 ENCOUNTER — Encounter: Payer: Self-pay | Admitting: Nurse Practitioner

## 2023-09-29 VITALS — BP 138/88 | HR 98 | Temp 97.6°F | Ht 58.5 in | Wt 207.6 lb

## 2023-09-29 VITALS — BP 116/76 | HR 109 | Temp 98.1°F | Ht 58.5 in | Wt 203.2 lb

## 2023-09-29 DIAGNOSIS — I10 Essential (primary) hypertension: Secondary | ICD-10-CM | POA: Diagnosis not present

## 2023-09-29 DIAGNOSIS — F419 Anxiety disorder, unspecified: Secondary | ICD-10-CM | POA: Diagnosis not present

## 2023-09-29 DIAGNOSIS — G4733 Obstructive sleep apnea (adult) (pediatric): Secondary | ICD-10-CM

## 2023-09-29 DIAGNOSIS — E119 Type 2 diabetes mellitus without complications: Secondary | ICD-10-CM

## 2023-09-29 DIAGNOSIS — E785 Hyperlipidemia, unspecified: Secondary | ICD-10-CM | POA: Diagnosis not present

## 2023-09-29 DIAGNOSIS — F32A Depression, unspecified: Secondary | ICD-10-CM

## 2023-09-29 DIAGNOSIS — Z794 Long term (current) use of insulin: Secondary | ICD-10-CM | POA: Diagnosis not present

## 2023-09-29 MED ORDER — TIRZEPATIDE 12.5 MG/0.5ML ~~LOC~~ SOAJ
12.5000 mg | SUBCUTANEOUS | 4 refills | Status: DC
  Filled 2023-09-29: qty 6, 84d supply, fill #0
  Filled 2023-10-12: qty 2, 28d supply, fill #0
  Filled 2023-11-14: qty 2, 28d supply, fill #1
  Filled 2023-12-12: qty 2, 28d supply, fill #2
  Filled 2024-01-16: qty 2, 28d supply, fill #3
  Filled 2024-02-13: qty 2, 28d supply, fill #4
  Filled 2024-03-10: qty 2, 28d supply, fill #5
  Filled 2024-04-09: qty 2, 28d supply, fill #6
  Filled 2024-05-07: qty 2, 28d supply, fill #7
  Filled 2024-06-08: qty 2, 28d supply, fill #8
  Filled 2024-07-02: qty 2, 28d supply, fill #9
  Filled 2024-08-21: qty 2, 28d supply, fill #10
  Filled 2024-09-23: qty 2, 28d supply, fill #11

## 2023-09-29 NOTE — Progress Notes (Addendum)
 @Patient  ID: Tamara Wells, female    DOB: 1961-03-03, 63 y.o.   MRN: [id number]  Chief Complaint  Patient presents with   Follow-up    Referring provider: Vincente Saber, NP  HPI: 63 year old female, former smoker followed for severe sleep apnea. Past medical history HTN, DM, anxiety, depression, obesity.   TEST/EVENTS:  08/11/2023 HST: AHI 30.8/h, SpO2 low 61%  08/05/2023: OV with Tamara Stailey NP for sleep consult. She had a sleep study many years ago.  She was told that she needed a CPAP but was unable to afford this at this time.  She has since had a job change and got new insurance.  She has issues with loud snoring and restless sleep.  She does have some daytime tiredness but is more so after she works her 12-hour shift.  She has frequent daytime headaches.  No issues with drowsy driving, sleep parasomnia/paralysis.  No history of narcolepsy or symptoms of cataplexy. She goes to bed around 9:30 PM.  Falls asleep within 30 minutes.  Wakes once a night.  Gets up around 5:15 AM.  Does not operate any heavy machinery in her job animal nutritionist.  Weight has fluctuated 10 pounds over the last 2 years.  Previous sleep study records not available.  Does not use any supplemental oxygen. She has a history of high blood pressure, diabetes well-controlled on current medication regimen.  No history of stroke. She is a former smoker.  Quit in 2006.  Seldomly drinks alcohol.  She usually drinks 3 cups of coffee a day.  Lives by herself.  Works as a advertising copywriter.  Has 3 adult children.  Family history of cancer. [place] 4  09/29/2023: Today - follow up Patient presents today for follow up. She had a home sleep study which revealed severe sleep apnea. She feels unchanged compared to her last visit. She wants to start CPAP. She denies any drowsy driving, sleep parasomnias/paralysis.   Allergies  Allergen Reactions   No Known Allergies     Immunization History  Administered Date(s) Administered    Influenza-Unspecified 07/19/2016, 06/29/2021, 06/20/2022, 07/04/2023   PFIZER Comirnaty(Gray Top)Covid-19 Tri-Sucrose Vaccine 09/24/2019, 10/15/2019   Pfizer Covid-19 Vaccine Bivalent Booster 5y-11y 07/11/2020   Tdap 09/24/2019, 07/05/2022    Past Medical History:  Diagnosis Date   Diabetes mellitus without complication (HCC)    Dx 2006   Hyperlipidemia    Hypertension    Sleep apnea    Vertigo    2002    Tobacco History: Social History   Tobacco Use  Smoking Status Former   Current packs/day: 0.00   Types: Cigarettes   Quit date: 09/27/2004   Years since quitting: 19.0  Smokeless Tobacco Never   Counseling given: Not Answered   Outpatient Medications Prior to Visit  Medication Sig Dispense Refill   albuterol  (VENTOLIN  HFA) 108 (90 Base) MCG/ACT inhaler Inhale 2 puffs into the lungs every 4 hours as needed for wheezing. 6.7 g 0   aspirin EC 81 MG tablet Take 81 mg by mouth daily.     benzonatate  (TESSALON  PERLES) 100 MG capsule Take 1 capsule (100 mg total) by mouth 3 (three) times daily as needed. 30 capsule 0   Black Cohosh 40 MG CAPS Take 40 mg by mouth 2 (two) times daily.     blood glucose meter kit and supplies Dispense based on patient and insurance preference. Use up to four times daily as directed. (FOR ICD-10 E10.9, E11.9). 1 each 0   Blood Pressure Monitoring (  OMRON 3 SERIES BP MONITOR) DEVI use to check blood pressure 1 each 0   Crisaborole  (EUCRISA ) 2 % OINT Apply 1 Application topically to affected skin on breast every morning. 60 g 2   fluconazole  (DIFLUCAN ) 200 MG tablet Take 1 tablet (200 mg total) by mouth every Monday, Wednesday, and Friday. 12 tablet 1   fluticasone  (FLONASE ) 50 MCG/ACT nasal spray Place 2 sprays into both nostrils daily. 16 g 6   furosemide  (LASIX ) 20 MG tablet Take 1 tablet (20 mg total) by mouth every other day. 30 tablet 3   gabapentin  (NEURONTIN ) 100 MG capsule Take 1 capsule (100 mg total) by mouth 3 (three) times daily. 90 capsule 3    glipiZIDE  (GLUCOTROL ) 10 MG tablet TAKE 1 TABLET BY MOUTH TWICE DAILY 180 tablet 5   glucose blood test strip Test as directed up to 4 times a day 100 each 0   hydrOXYzine  (VISTARIL ) 25 MG capsule Take 1 capsule (25 mg total) by mouth every 8 (eight) hours as needed. 30 capsule 0   insulin  glargine-yfgn (SEMGLEE , YFGN,) 100 UNIT/ML Pen Inject 40 Units into the skin daily. 15 mL 5   ketoconazole  (NIZORAL ) 2 % cream Apply 1 Application topically 2 (two) times daily under the breast. 60 g 5   Lancets (FREESTYLE) lancets Test up to 4 times a day as directed 100 each 0   latanoprost  (XALATAN ) 0.005 % ophthalmic solution instill 1 drop into affected eye(s) once daily in the evening 5 mL 3   latanoprost  (XALATAN ) 0.005 % ophthalmic solution Place 1 drop into both eyes daily in the evening. 2.5 mL 0   latanoprost  (XALATAN ) 0.005 % ophthalmic solution Place 1 drop into affected eyes daily in the evening. 5 mL 1   magic mouthwash (nystatin , lidocaine , diphenhydrAMINE , alum & mag hydroxide) suspension Swish and spit 5 mLs 3 (three) times daily as needed for mouth pain. 180 mL 0   meclizine (ANTIVERT) 25 MG tablet Take 25 mg by mouth 3 (three) times daily as needed for dizziness.      meloxicam  (MOBIC ) 15 MG tablet Take 1 tablet (15 mg total) by mouth daily with meal. 90 tablet 2   metFORMIN  (GLUCOPHAGE ) 1000 MG tablet Take 1 tablet (1,000 mg total) by mouth 2 (two) times daily. 180 tablet 2   mometasone  (ELOCON ) 0.1 % cream Apply 1 Application topically 2 (two) times daily under breast 3 times a week. 45 g 5   Multiple Vitamins-Minerals (CENTRAL-VITE PO) Take 1 tablet by mouth daily.     rosuvastatin  (CRESTOR ) 10 MG tablet Take 1 tablet (10 mg total) by mouth daily. 90 tablet 3   tirzepatide  (MOUNJARO ) 10 MG/0.5ML Pen Inject 10 mg into the skin once a week. 6 mL 2   valsartan -hydrochlorothiazide  (DIOVAN -HCT) 80-12.5 MG tablet Take 1 tablet by mouth daily. 90 tablet 3   No facility-administered medications  prior to visit.     Review of Systems:   Constitutional: No weight loss or gain, night sweats, fevers, chills, or lassitude. +occasional fatigue  HEENT: No difficulty swallowing, tooth/dental problems, or sore throat. No sneezing, itching, ear ache, nasal congestion, or post nasal drip. +frequent headaches  CV:  No chest pain, orthopnea, PND, swelling in lower extremities, anasarca, dizziness, palpitations, syncope Resp: +snoring; baseline shortness of breath with exertion. No excess mucus or change in color of mucus. No productive or non-productive. No hemoptysis. No wheezing.  No chest wall deformity GI:  No heartburn, indigestion GU: No dysuria, change in color of  urine, urgency or frequency.   Skin: No rash, lesions, ulcerations MSK:  No joint pain or swelling.  Neuro: No dizziness or lightheadedness.  Psych: +stable depression, anxiety. No SI/HI. Mood stable. +sleep disturbance     Physical Exam:  BP 138/88 (BP Location: Left Arm, Patient Position: Sitting, Cuff Size: Normal)   Pulse 98   Temp 97.6 F (36.4 C) (Temporal)   Ht 4' 10.5 (1.486 m)   Wt 207 lb 9.6 oz (94.2 kg)   SpO2 97%   BMI 42.65 kg/m   GEN: Pleasant, interactive, well-appearing; obese; in no acute distress HEENT:  Normocephalic and atraumatic. PERRLA. Sclera white. Nasal turbinates pink, moist and patent bilaterally. No rhinorrhea present. Oropharynx pink and moist, without exudate or edema. No lesions, ulcerations, or postnasal drip. Mallampati III NECK:  Supple w/ fair ROM. No JVD present. Normal carotid impulses w/o bruits. Thyroid  symmetrical with no goiter or nodules palpated. No lymphadenopathy.   CV: RRR, no m/r/g, no peripheral edema. Pulses intact, +2 bilaterally. No cyanosis, pallor or clubbing. PULMONARY:  Unlabored, regular breathing. Clear bilaterally A&P w/o wheezes/rales/rhonchi. No accessory muscle use.  GI: BS present and normoactive. Soft, non-tender to palpation. No organomegaly or masses  detected.  MSK: No erythema, warmth or tenderness. Cap refil <2 sec all extrem. No deformities or joint swelling noted.  Neuro: A/Ox3. No focal deficits noted.   Skin: Warm, no lesions or rashe Psych: Normal affect and behavior. Judgement and thought content appropriate.     Lab Results:  CBC    Component Value Date/Time   WBC 9.9 01/07/2023 1602   WBC 7.1 08/04/2021 1043   RBC 4.33 01/07/2023 1602   RBC 4.38 08/04/2021 1043   HGB 12.2 01/07/2023 1602   HCT 36.8 01/07/2023 1602   PLT 317 01/07/2023 1602   MCV 85 01/07/2023 1602   MCH 28.2 01/07/2023 1602   MCH 29.0 08/04/2021 1043   MCHC 33.2 01/07/2023 1602   MCHC 33.4 08/04/2021 1043   RDW 14.0 01/07/2023 1602   LYMPHSABS 4.3 (H) 01/07/2023 1602   EOSABS 0.0 01/07/2023 1602   BASOSABS 0.0 01/07/2023 1602    BMET    Component Value Date/Time   NA 139 05/26/2023 1006   NA 142 01/07/2023 1602   K 3.8 05/26/2023 1006   CL 102 05/26/2023 1006   CO2 26 05/26/2023 1006   GLUCOSE 48 (LL) 05/26/2023 1006   BUN 22 05/26/2023 1006   BUN 22 01/07/2023 1602   CREATININE 0.93 05/26/2023 1006   CREATININE 0.62 08/04/2021 1043   CALCIUM  9.6 05/26/2023 1006   GFRNONAA 99 02/21/2020 0952   GFRAA 115 02/21/2020 0952    BNP No results found for: BNP   Imaging:  No results found.  Administration History     None           No data to display          No results found for: NITRICOXIDE      Assessment & Plan:   Severe obstructive sleep apnea Severe OSA with AHI 30.8/h and severe oxygen desaturations. Reviewed risks of untreated OSA and potential treatment options. Shared decision to move forward with CPAP therapy given severity. Orders placed for auto CPAP 5-15 cmH2O, mask of choice and heated humidity. Educated on proper use/care of device. Risks/benefits reviewed. Healthy weight loss encouraged. Safe driving practices reviewed.   Patient Instructions  Start CPAP every night, minimum of 4-6 hours a  night.  Change equipment as directed. Wash your tubing with  warm soap and water daily, hang to dry. Wash humidifier portion weekly. Use bottled, distilled water and change daily Be aware of reduced alertness and do not drive or operate heavy machinery if experiencing this or drowsiness.  Exercise encouraged, as tolerated. Healthy weight management discussed.  Avoid or decrease alcohol consumption and medications that make you more sleepy, if possible. Notify if persistent daytime sleepiness occurs even with consistent use of PAP therapy.  We discussed how untreated sleep apnea puts an individual at risk for cardiac arrhthymias, pulm HTN, DM, stroke and increases their risk for daytime accidents. We also briefly reviewed treatment options including weight loss, side sleeping position, oral appliance, CPAP therapy or referral to ENT for possible surgical options  Call if you haven't heard from the medical supply company in 2-3 weeks   Follow up in 10-12 weeks with Tamara Naika Noto,NP to see how CPAP is going, or sooner, if needed    Advised if symptoms do not improve or worsen, to please contact office for sooner follow up or seek emergency care.   I spent 35 minutes of dedicated to the care of this patient on the date of this encounter to include pre-visit review of records, face-to-face time with the patient discussing conditions above, post visit ordering of testing, clinical documentation with the electronic health record, making appropriate referrals as documented, and communicating necessary findings to members of the patients care team.  Tamara LULLA Rouleau, NP 09/29/2023  Pt aware and understands NP's role.

## 2023-09-29 NOTE — Progress Notes (Signed)
 Established Patient Office Visit  Subjective:  Patient ID: Tamara Wells, female    DOB: 1960/11/02  Age: 63 y.o. MRN: [id number]  CC:  Chief Complaint  Patient presents with   Medical Management of Chronic Issues    HPI  AMIAH FROHLICH  with a history of diabetes, hypertension, and anxiety, reports improved blood sugar and blood pressure control. She has been monitoring her blood sugar regularly, with recent readings around 118. She also reports a slow but steady weight loss, from 208 lbs in November to 203 lbs currently  She also reports a recent diagnosis of sleep apnea following a sleep study in November, and she is awaiting a CPAP machine.  She reports a significant improvement in her anxiety symptoms, attributing this to a recent visit with a mental health professional. However, she also shares the emotional toll of dealing with family members' health issues, including a grandson in prison and a cousin with cancer.  The patient reports a burning sensation in her legs, particularly when standing for extended periods. The sensation is more pronounced in the left leg and is located in the upper thigh area. She is currently taking gabapentin  at night for this symptom.  HPI   Past Medical History:  Diagnosis Date   Diabetes mellitus without complication (HCC)    Dx 2006   Hyperlipidemia    Hypertension    Sleep apnea    Vertigo    2002    Past Surgical History:  Procedure Laterality Date   ABDOMINAL HYSTERECTOMY     2000   CARPAL TUNNEL RELEASE Left    CARPAL TUNNEL RELEASE Right 05/28/2019   Procedure: CARPAL TUNNEL RELEASE;  Surgeon: Cleotilde Barrio, MD;  Location: [facility] ORS;  Service: Orthopedics;  Laterality: Right;   COLONOSCOPY WITH PROPOFOL  N/A 03/21/2020   Procedure: COLONOSCOPY WITH PROPOFOL ;  Surgeon: Janalyn Keene NOVAK, MD;  Location: [facility];  Service: Endoscopy;  Laterality: N/A;   TONSILLECTOMY      Family History  Problem Relation Age of  Onset   Diabetes Mother    Diabetes Father    Diabetes Maternal Grandmother    Breast cancer Neg Hx     Social History   Socioeconomic History   Marital status: Married    Spouse name: Not on file   Number of children: Not on file   Years of education: Not on file   Highest education level: Not on file  Occupational History   Not on file  Tobacco Use   Smoking status: Former    Current packs/day: 0.00    Types: Cigarettes    Quit date: 09/27/2004    Years since quitting: 19.0   Smokeless tobacco: Never  Vaping Use   Vaping status: Never Used  Substance and Sexual Activity   Alcohol use: No   Drug use: No   Sexual activity: Not Currently    Birth control/protection: Surgical  Other Topics Concern   Not on file  Social History Narrative   Not on file   Social Drivers of Health   Financial Resource Strain: Not on file  Food Insecurity: Not on file  Transportation Needs: Not on file  Physical Activity: Not on file  Stress: Not on file  Social Connections: Not on file  Intimate Partner Violence: Not on file     Outpatient Medications Prior to Visit  Medication Sig Dispense Refill   albuterol  (VENTOLIN  HFA) 108 (90 Base) MCG/ACT inhaler Inhale 2 puffs into the lungs every 4  hours as needed for wheezing. 6.7 g 0   aspirin EC 81 MG tablet Take 81 mg by mouth daily.     benzonatate  (TESSALON  PERLES) 100 MG capsule Take 1 capsule (100 mg total) by mouth 3 (three) times daily as needed. 30 capsule 0   Black Cohosh 40 MG CAPS Take 40 mg by mouth 2 (two) times daily.     blood glucose meter kit and supplies Dispense based on patient and insurance preference. Use up to four times daily as directed. (FOR ICD-10 E10.9, E11.9). 1 each 0   Blood Pressure Monitoring (OMRON 3 SERIES BP MONITOR) DEVI use to check blood pressure 1 each 0   Crisaborole  (EUCRISA ) 2 % OINT Apply 1 Application topically to affected skin on breast every morning. 60 g 2   fluconazole  (DIFLUCAN ) 200 MG  tablet Take 1 tablet (200 mg total) by mouth every Monday, Wednesday, and Friday. 12 tablet 1   fluticasone  (FLONASE ) 50 MCG/ACT nasal spray Place 2 sprays into both nostrils daily. 16 g 6   furosemide  (LASIX ) 20 MG tablet Take 1 tablet (20 mg total) by mouth every other day. 30 tablet 3   gabapentin  (NEURONTIN ) 100 MG capsule Take 1 capsule (100 mg total) by mouth 3 (three) times daily. 90 capsule 3   glipiZIDE  (GLUCOTROL ) 10 MG tablet TAKE 1 TABLET BY MOUTH TWICE DAILY 180 tablet 5   glucose blood test strip Test as directed up to 4 times a day 100 each 0   hydrOXYzine  (VISTARIL ) 25 MG capsule Take 1 capsule (25 mg total) by mouth every 8 (eight) hours as needed. 30 capsule 0   insulin  glargine-yfgn (SEMGLEE , YFGN,) 100 UNIT/ML Pen Inject 40 Units into the skin daily. 15 mL 5   ketoconazole  (NIZORAL ) 2 % cream Apply 1 Application topically 2 (two) times daily under the breast. 60 g 5   Lancets (FREESTYLE) lancets Test up to 4 times a day as directed 100 each 0   latanoprost  (XALATAN ) 0.005 % ophthalmic solution instill 1 drop into affected eye(s) once daily in the evening 5 mL 3   latanoprost  (XALATAN ) 0.005 % ophthalmic solution Place 1 drop into both eyes daily in the evening. 2.5 mL 0   latanoprost  (XALATAN ) 0.005 % ophthalmic solution Place 1 drop into affected eyes daily in the evening. 5 mL 1   magic mouthwash (nystatin , lidocaine , diphenhydrAMINE , alum & mag hydroxide) suspension Swish and spit 5 mLs 3 (three) times daily as needed for mouth pain. 180 mL 0   meclizine (ANTIVERT) 25 MG tablet Take 25 mg by mouth 3 (three) times daily as needed for dizziness.      meloxicam  (MOBIC ) 15 MG tablet Take 1 tablet (15 mg total) by mouth daily with meal. 90 tablet 2   metFORMIN  (GLUCOPHAGE ) 1000 MG tablet Take 1 tablet (1,000 mg total) by mouth 2 (two) times daily. 180 tablet 2   mometasone  (ELOCON ) 0.1 % cream Apply 1 Application topically 2 (two) times daily under breast 3 times a week. 45 g 5    Multiple Vitamins-Minerals (CENTRAL-VITE PO) Take 1 tablet by mouth daily.     rosuvastatin  (CRESTOR ) 10 MG tablet Take 1 tablet (10 mg total) by mouth daily. 90 tablet 3   tirzepatide  (MOUNJARO ) 10 MG/0.5ML Pen Inject 10 mg into the skin once a week. 6 mL 2   valsartan -hydrochlorothiazide  (DIOVAN -HCT) 80-12.5 MG tablet Take 1 tablet by mouth daily. 90 tablet 3   No facility-administered medications prior to visit.  Allergies  Allergen Reactions   No Known Allergies     ROS Review of Systems Negative unless indicated in HPI.    Objective:    Physical Exam Constitutional:      Appearance: Normal appearance.  HENT:     Right Ear: Tympanic membrane normal.     Left Ear: Tympanic membrane normal.     Mouth/Throat:     Mouth: Mucous membranes are moist.  Eyes:     Conjunctiva/sclera: Conjunctivae normal.     Pupils: Pupils are equal, round, and reactive to light.  Cardiovascular:     Rate and Rhythm: Normal rate and regular rhythm.     Pulses: Normal pulses.     Heart sounds: Normal heart sounds.  Pulmonary:     Effort: Pulmonary effort is normal.     Breath sounds: Normal breath sounds.  Abdominal:     General: Bowel sounds are normal.     Palpations: Abdomen is soft.  Musculoskeletal:     Cervical back: Normal range of motion. No tenderness.  Skin:    General: Skin is warm.     Findings: No bruising.  Neurological:     General: No focal deficit present.     Mental Status: She is alert and oriented to person, place, and time. Mental status is at baseline.  Psychiatric:        Mood and Affect: Mood normal.        Behavior: Behavior normal.        Thought Content: Thought content normal.        Judgment: Judgment normal.     BP 116/76   Pulse (!) 109   Temp 98.1 F (36.7 C)   Ht 4' 10.5 (1.486 m)   Wt 203 lb 3.2 oz (92.2 kg)   SpO2 93%   BMI 41.75 kg/m  Wt Readings from Last 3 Encounters:  09/29/23 203 lb 3.2 oz (92.2 kg)  09/29/23 207 lb 9.6 oz (94.2  kg)  08/05/23 208 lb 12.8 oz (94.7 kg)     Health Maintenance  Topic Date Due   FOOT EXAM  Never done   Zoster Vaccines- Shingrix (1 of 2) Never done   Cervical Cancer Screening (HPV/Pap Cotest)  Never done   Colonoscopy  03/21/2021   OPHTHALMOLOGY EXAM  06/30/2022   COVID-19 Vaccine (4 - 2024-25 season) 10/15/2023 (Originally 05/29/2023)   MAMMOGRAM  12/27/2023   HEMOGLOBIN A1C  03/28/2024   Diabetic kidney evaluation - eGFR measurement  05/25/2024   Diabetic kidney evaluation - Urine ACR  09/28/2024   DTaP/Tdap/Td (3 - Td or Tdap) 07/05/2032   INFLUENZA VACCINE  Completed   Hepatitis C Screening  Completed   HIV Screening  Completed   HPV VACCINES  Aged Out    There are no preventive care reminders to display for this patient.  Lab Results  Component Value Date   TSH 1.920 01/07/2023   Lab Results  Component Value Date   WBC 9.9 01/07/2023   HGB 12.2 01/07/2023   HCT 36.8 01/07/2023   MCV 85 01/07/2023   PLT 317 01/07/2023   Lab Results  Component Value Date   NA 139 05/26/2023   K 3.8 05/26/2023   CO2 26 05/26/2023   GLUCOSE 48 (LL) 05/26/2023   BUN 22 05/26/2023   CREATININE 0.93 05/26/2023   BILITOT 0.3 05/26/2023   ALKPHOS 67 05/26/2023   AST 17 05/26/2023   ALT 17 05/26/2023   PROT 7.1 05/26/2023   ALBUMIN 4.2  05/26/2023   CALCIUM  9.6 05/26/2023   ANIONGAP 10 09/17/2018   EGFR 86 01/07/2023   GFR 66.67 05/26/2023   Lab Results  Component Value Date   CHOL 121 09/29/2023   Lab Results  Component Value Date   HDL 41.50 09/29/2023   Lab Results  Component Value Date   LDLCALC 48 09/29/2023   Lab Results  Component Value Date   TRIG 159.0 (H) 09/29/2023   Lab Results  Component Value Date   CHOLHDL 3 09/29/2023   Lab Results  Component Value Date   HGBA1C 7.1 (H) 09/29/2023      Assessment & Plan:  Type 2 diabetes mellitus without complication, with long-term current use of insulin  (HCC) Assessment & Plan: Improved blood glucose  control, but A1c still 7.2. Patient reports burning sensation in legs, more pronounced when standing for long periods, possibly indicative of diabetic neuropathy. Currently on Gabapentin  at night. -Increase Monjora to 12.5mg  daily. -Consider increasing Gabapentin  to three pills at night. -Order A1c and microalbuminuria tests today.  Orders: -     Microalbumin / creatinine urine ratio -     Hemoglobin A1c -     Tirzepatide ; Inject 12.5 mg into the skin once a week.  Dispense: 6 mL; Refill: 4  Hyperlipidemia, unspecified hyperlipidemia type Assessment & Plan: Lab Results  Component Value Date   CHOL 121 09/29/2023   HDL 41.50 09/29/2023   LDLCALC 48 09/29/2023   TRIG 159.0 (H) 09/29/2023   CHOLHDL 3 09/29/2023  Advised patient to watch diet and follow regular exercise schedule. Continue Crestor  10 mg daily   Orders: -     Lipid panel  Severe obstructive sleep apnea Assessment & Plan: Recent sleep study confirmed diagnosis. Patient to start CPAP therapy. -Ensure patient follows up with pulmonologist after 10 weeks of CPAP use   Primary hypertension Assessment & Plan: Improved control, recent reading 116/76. -Continue current management.   Anxiety and depression Assessment & Plan: Symptoms appear well-controlled currently. -Continue current management.     Follow-up: Return in about 6 months (around 03/28/2024) for follow up with fasting lab 2 days prior.   Maiyah Goyne, NP

## 2023-09-29 NOTE — Assessment & Plan Note (Signed)
 Severe OSA with AHI 30.8/h and severe oxygen desaturations. Reviewed risks of untreated OSA and potential treatment options. Shared decision to move forward with CPAP therapy given severity. Orders placed for auto CPAP 5-15 cmH2O, mask of choice and heated humidity. Educated on proper use/care of device. Risks/benefits reviewed. Healthy weight loss encouraged. Safe driving practices reviewed.   Patient Instructions  Start CPAP every night, minimum of 4-6 hours a night.  Change equipment as directed. Wash your tubing with warm soap and water daily, hang to dry. Wash humidifier portion weekly. Use bottled, distilled water and change daily Be aware of reduced alertness and do not drive or operate heavy machinery if experiencing this or drowsiness.  Exercise encouraged, as tolerated. Healthy weight management discussed.  Avoid or decrease alcohol consumption and medications that make you more sleepy, if possible. Notify if persistent daytime sleepiness occurs even with consistent use of PAP therapy.  We discussed how untreated sleep apnea puts an individual at risk for cardiac arrhthymias, pulm HTN, DM, stroke and increases their risk for daytime accidents. We also briefly reviewed treatment options including weight loss, side sleeping position, oral appliance, CPAP therapy or referral to ENT for possible surgical options  Call if you haven't heard from the medical supply company in 2-3 weeks   Follow up in 10-12 weeks with Katie Harlei Lehrmann,NP to see how CPAP is going, or sooner, if needed

## 2023-09-29 NOTE — Patient Instructions (Signed)
 Start CPAP every night, minimum of 4-6 hours a night.  Change equipment as directed. Wash your tubing with warm soap and water daily, hang to dry. Wash humidifier portion weekly. Use bottled, distilled water and change daily Be aware of reduced alertness and do not drive or operate heavy machinery if experiencing this or drowsiness.  Exercise encouraged, as tolerated. Healthy weight management discussed.  Avoid or decrease alcohol consumption and medications that make you more sleepy, if possible. Notify if persistent daytime sleepiness occurs even with consistent use of PAP therapy.  We discussed how untreated sleep apnea puts an individual at risk for cardiac arrhthymias, pulm HTN, DM, stroke and increases their risk for daytime accidents. We also briefly reviewed treatment options including weight loss, side sleeping position, oral appliance, CPAP therapy or referral to ENT for possible surgical options  Call if you haven't heard from the medical supply company in 2-3 weeks   Follow up in 10-12 weeks with Katie Annalisia Ingber,NP to see how CPAP is going, or sooner, if needed

## 2023-09-30 ENCOUNTER — Other Ambulatory Visit: Payer: Self-pay

## 2023-09-30 LAB — LIPID PANEL
Cholesterol: 121 mg/dL (ref 0–200)
HDL: 41.5 mg/dL (ref >39.00)
LDL Cholesterol: 48 mg/dL (ref 0–99)
NonHDL: 79.4
Total CHOL/HDL Ratio: 3
Triglycerides: 159 mg/dL — ABNORMAL HIGH (ref 0.0–149.0)
VLDL: 31.8 mg/dL (ref 0.0–40.0)

## 2023-09-30 LAB — MICROALBUMIN / CREATININE URINE RATIO
Creatinine,U: 56.9 mg/dL
Microalb Creat Ratio: 1.2 mg/g (ref 0.0–30.0)
Microalb, Ur: <0.7 mg/dL (ref 0.0–1.9)

## 2023-09-30 LAB — HEMOGLOBIN A1C: Hgb A1c MFr Bld: 7.1 % — ABNORMAL HIGH (ref 4.6–6.5)

## 2023-10-05 NOTE — Assessment & Plan Note (Signed)
 Symptoms appear well-controlled currently. -Continue current management.

## 2023-10-05 NOTE — Assessment & Plan Note (Signed)
 Improved blood glucose control, but A1c still 7.2. Patient reports burning sensation in legs, more pronounced when standing for long periods, possibly indicative of diabetic neuropathy. Currently on Gabapentin  at night. -Increase Monjora to 12.5mg  daily. -Consider increasing Gabapentin  to three pills at night. -Order A1c and microalbuminuria tests today.

## 2023-10-05 NOTE — Assessment & Plan Note (Signed)
 Lab Results  Component Value Date   CHOL 121 09/29/2023   HDL 41.50 09/29/2023   LDLCALC 48 09/29/2023   TRIG 159.0 (H) 09/29/2023   CHOLHDL 3 09/29/2023  Advised patient to watch diet and follow regular exercise schedule. Continue Crestor 10 mg daily.

## 2023-10-05 NOTE — Assessment & Plan Note (Signed)
 Improved control, recent reading 116/76. -Continue current management.

## 2023-10-05 NOTE — Assessment & Plan Note (Signed)
 Recent sleep study confirmed diagnosis. Patient to start CPAP therapy. -Ensure patient follows up with pulmonologist after 10 weeks of CPAP use

## 2023-10-11 ENCOUNTER — Other Ambulatory Visit: Payer: Self-pay

## 2023-10-11 DIAGNOSIS — H401132 Primary open-angle glaucoma, bilateral, moderate stage: Secondary | ICD-10-CM | POA: Diagnosis not present

## 2023-10-11 MED ORDER — LATANOPROST 0.005 % OP SOLN
1.0000 [drp] | Freq: Every evening | OPHTHALMIC | 1 refills | Status: DC
  Filled 2023-10-11: qty 2.5, 50d supply, fill #0
  Filled 2024-02-13: qty 2.5, 50d supply, fill #1
  Filled 2024-06-08: qty 2.5, 50d supply, fill #2
  Filled 2024-07-30: qty 2.5, 50d supply, fill #3

## 2023-10-12 ENCOUNTER — Other Ambulatory Visit: Payer: Self-pay

## 2023-10-13 ENCOUNTER — Ambulatory Visit: Payer: Self-pay | Admitting: Nurse Practitioner

## 2023-10-13 ENCOUNTER — Emergency Department
Admission: EM | Admit: 2023-10-13 | Discharge: 2023-10-13 | Disposition: A | Discharge status: Home / Self Care | Payer: Commercial Managed Care - PPO | Source: Home / Self Care

## 2023-10-13 NOTE — Telephone Encounter (Signed)
Copied from CRM [id number]. Topic: Clinical - Red Word Triage >> Oct 13, 2023  2:20 PM Almira Coaster wrote: Red Word that prompted transfer to Nurse Triage: Patient is calling with symptoms of numbness feeling in her mouth, arm soreness, stomach pain and hot flashes for the last two day.  Chief Complaint: Mouth numbness Symptoms: Mouth numbness, arm weakness, headache, hot flashes Frequency: 3 days Pertinent Negatives: Patient denies vision or speech changes Disposition: [x] ED /[] Urgent Care (no appt availability in office) / [] Appointment(In office/virtual)/ []  [place] [facility]/ [] Home Care/ [] Refused Recommended Disposition /[] [place] [facility]/ []  Follow-up with PCP Additional Notes: Patient called in to report several neurological symptoms. Patient complained of numbness in her mouth. Patient also reported soreness and weakness in both arms. Patient stated she has a hard time lifting her arms up. Patient stated she has felt more unsteady on her feet these past few days. Patient stated she also had a headache the past couple of days. Patient also reported frequent hot flashes. Patient denied facial drooping, slurred speech, vision changes, and disorientation. Advised patient to call 911 to rule out stroke. This RN offered to call 911 for patient and patient stated she would like to call herself. This RN attempted to call patient back shortly after the call to ensure 911 was called and was transferred to voicemail.   Reason for Disposition  [1] Weakness (i.e., paralysis, loss of muscle strength) of the face, arm / hand, or leg / foot on one side of the body AND [2] sudden onset AND [3] present now  (Exception: Bell's palsy suspected [i.e., weakness only on one side of the face, developing over hours to days, no other symptoms].)  Answer Assessment - Initial Assessment Questions 1. SYMPTOM: "What is the main symptom you are concerned about?" (e.g., weakness, numbness)     Mouth numbness and  soreness/weakness in both arms  2. ONSET: "When did this start?" (minutes, hours, days; while sleeping)     3 days ago  3. LAST NORMAL: "When was the last time you (the patient) were normal (no symptoms)?"     Tuesday  4. PATTERN "Does this come and go, or has it been constant since it started?"  "Is it present now?"     Symptoms come and go  5. CARDIAC SYMPTOMS: "Have you had any of the following symptoms: chest pain, difficulty breathing, palpitations?"     Denies  6. NEUROLOGIC SYMPTOMS: "Have you had any of the following symptoms: headache, dizziness, vision loss, double vision, changes in speech, unsteady on your feet?"     Headache, patient states she hasn't been able to move around as steadily these past few days  7. OTHER SYMPTOMS: "Do you have any other symptoms?"     Hot flashes  Protocols used: Neurologic Deficit-A-AH

## 2023-10-14 ENCOUNTER — Encounter: Payer: Self-pay | Admitting: Emergency Medicine

## 2023-10-14 ENCOUNTER — Emergency Department
Admission: EM | Admit: 2023-10-14 | Discharge: 2023-10-14 | Disposition: A | Discharge status: Home / Self Care | Payer: Commercial Managed Care - PPO | Attending: Student in an Organized Health Care Education/Training Program | Admitting: Student in an Organized Health Care Education/Training Program

## 2023-10-14 ENCOUNTER — Other Ambulatory Visit: Payer: Self-pay

## 2023-10-14 ENCOUNTER — Emergency Department: Payer: Commercial Managed Care - PPO

## 2023-10-14 DIAGNOSIS — M79602 Pain in left arm: Secondary | ICD-10-CM | POA: Diagnosis not present

## 2023-10-14 DIAGNOSIS — R200 Anesthesia of skin: Secondary | ICD-10-CM | POA: Diagnosis not present

## 2023-10-14 DIAGNOSIS — R202 Paresthesia of skin: Secondary | ICD-10-CM | POA: Diagnosis not present

## 2023-10-14 DIAGNOSIS — Z1152 Encounter for screening for COVID-19: Secondary | ICD-10-CM | POA: Insufficient documentation

## 2023-10-14 DIAGNOSIS — M79603 Pain in arm, unspecified: Secondary | ICD-10-CM | POA: Insufficient documentation

## 2023-10-14 DIAGNOSIS — E119 Type 2 diabetes mellitus without complications: Secondary | ICD-10-CM | POA: Diagnosis not present

## 2023-10-14 DIAGNOSIS — K1379 Other lesions of oral mucosa: Secondary | ICD-10-CM | POA: Insufficient documentation

## 2023-10-14 LAB — BASIC METABOLIC PANEL
Anion gap: 11 (ref 5–15)
BUN: 32 mg/dL — ABNORMAL HIGH (ref 8–23)
CO2: 25 mmol/L (ref 22–32)
Calcium: 9.2 mg/dL (ref 8.9–10.3)
Chloride: 104 mmol/L (ref 98–111)
Creatinine, Ser: 0.91 mg/dL (ref 0.44–1.00)
GFR, Estimated: >60 mL/min (ref >60)
Glucose, Bld: 155 mg/dL — ABNORMAL HIGH (ref 70–99)
Potassium: 4.1 mmol/L (ref 3.5–5.1)
Sodium: 140 mmol/L (ref 135–145)

## 2023-10-14 LAB — CBC WITH DIFFERENTIAL/PLATELET
Abs Immature Granulocytes: 0.02 10*3/uL (ref 0.00–0.07)
Basophils Absolute: 0 10*3/uL (ref 0.0–0.1)
Basophils Relative: 0 %
Eosinophils Absolute: 0.1 10*3/uL (ref 0.0–0.5)
Eosinophils Relative: 1 %
HCT: 37.1 % (ref 36.0–46.0)
Hemoglobin: 12 g/dL (ref 12.0–15.0)
Immature Granulocytes: 0 %
Lymphocytes Relative: 34 %
Lymphs Abs: 2.8 10*3/uL (ref 0.7–4.0)
MCH: 29 pg (ref 26.0–34.0)
MCHC: 32.3 g/dL (ref 30.0–36.0)
MCV: 89.6 fL (ref 80.0–100.0)
Monocytes Absolute: 0.5 10*3/uL (ref 0.1–1.0)
Monocytes Relative: 7 %
Neutro Abs: 4.8 10*3/uL (ref 1.7–7.7)
Neutrophils Relative %: 58 %
Platelets: 339 10*3/uL (ref 150–400)
RBC: 4.14 MIL/uL (ref 3.87–5.11)
RDW: 14.4 % (ref 11.5–15.5)
WBC: 8.2 10*3/uL (ref 4.0–10.5)
nRBC: 0 % (ref 0.0–0.2)

## 2023-10-14 LAB — RESP PANEL BY RT-PCR (RSV, FLU A&B, COVID)  RVPGX2
Influenza A by PCR: NEGATIVE
Influenza B by PCR: NEGATIVE
Resp Syncytial Virus by PCR: NEGATIVE
SARS Coronavirus 2 by RT PCR: NEGATIVE

## 2023-10-14 NOTE — ED Provider Triage Note (Signed)
Emergency Medicine Provider Triage Evaluation Note  CATESSA KNOX , a 63 y.o. female  was evaluated in triage.  Pt complains of numbness on tongue, sent by [facility] for stroke eval, also has cold sx and not feeling well all week.  Review of Systems  Positive:  Negative:   Physical Exam  BP 131/84 (BP Location: Left Arm)   Pulse 98   Temp 98.4 F (36.9 C) (Oral)   Resp 16   Ht 4\' 9"  (1.448 m)   Wt 90.7 kg   SpO2 98%   BMI 43.28 kg/m  Gen:   Awake, no distress   Resp:  Normal effort  MSK:   Moves extremities without difficulty  Other:    Medical Decision Making  Medically screening exam initiated at 7:20 AM.  Appropriate orders placed.  RHELDA HEITZMANN was informed that the remainder of the evaluation will be completed by another provider, this initial triage assessment does not replace that evaluation, and the importance of remaining in the ED until their evaluation is complete.     Faythe Ghee, PA-C 10/14/23 [phone]

## 2023-10-14 NOTE — ED Triage Notes (Addendum)
Pt to ED via POV. Pt states that she has been having numbness in her mouth. Pt states that she is also having soreness in her mouth and her arms. Pt states that it is hard to pick up her pocketbook due to the soreness in her arms. Pt denies weakness in her arms. Pt denies slurred speech or blurred vision. Pt does not have facial droop or arm drift. Pt denies fevers or chills at this time. Pt reports that she did have some dizziness on Tuesday as well as nausea. Pt reports hx/o vertigo.

## 2023-10-14 NOTE — ED Provider Notes (Signed)
[facility] Provider Note    Event Date/Time   First MD Initiated Contact with Patient 10/14/23 [phone]     (approximate)   History   Numbness, Arm Pain, and Mouth Pain   HPI  Tamara Wells is a 63 y.o. female with a history of diabetes presents to the ER for evaluation of several days of mouth pain as well as bilateral anterior arm pain.  States that she left work a few days ago it is because she was not feeling quite well.  She denies any chest pain no shortness of breath no pleuritic discomfort.  No headaches.  No blurred vision.  Denies any numbness or tingling.  States that mouth pain is just under her dentures.  Denies any rashes or lesions.  No sores.     Physical Exam   Triage Vital Signs: ED Triage Vitals  Encounter Vitals Group     BP 10/14/23 0707 131/84     Systolic BP Percentile --      Diastolic BP Percentile --      Pulse Rate 10/14/23 0707 98     Resp 10/14/23 0707 16     Temp 10/14/23 0707 98.4 F (36.9 C)     Temp Source 10/14/23 0707 Oral     SpO2 10/14/23 0707 98 %     Weight 10/14/23 0708 200 lb (90.7 kg)     Height 10/14/23 0708 4\' 9"  (1.448 m)     Head Circumference --      Peak Flow --      Pain Score 10/14/23 0708 6     Pain Loc --      Pain Education --      Exclude from Growth Chart --     Most recent vital signs: Vitals:   10/14/23 0707  BP: 131/84  Pulse: 98  Resp: 16  Temp: 98.4 F (36.9 C)  SpO2: 98%     Constitutional: Alert  Eyes: Conjunctivae are normal.  Head: Atraumatic. Nose: No congestion/rhinnorhea. Mouth/Throat: Mucous membranes are moist.  No vesicles, no swelling or fluctuance, no blistering, Neck: Painless ROM.  Cardiovascular:   Good peripheral circulation. Respiratory: Normal respiratory effort.  No retractions.  Gastrointestinal: Soft and nontender.  Musculoskeletal:  no deformity Neurologic:  CN- intact.  No facial droop, Normal FNF.  Normal heel to shin.  Sensation intact  bilaterally. Normal speech and language. No gross focal neurologic deficits are appreciated. No gait instability. Skin:  Skin is warm, dry and intact. No rash noted. Psychiatric: Mood and affect are normal. Speech and behavior are normal.    ED Results / Procedures / Treatments   Labs (all labs ordered are listed, but only abnormal results are displayed) Labs Reviewed  BASIC METABOLIC PANEL - Abnormal; Notable for the following components:      Result Value   Glucose, Bld 155 (*)    BUN 32 (*)    All other components within normal limits  RESP PANEL BY RT-PCR (RSV, FLU A&B, COVID)  [id number]  CBC WITH DIFFERENTIAL/PLATELET       RADIOLOGY Please see ED Course for my review and interpretation.  I personally reviewed all radiographic images ordered to evaluate for the above acute complaints and reviewed radiology reports and findings.  These findings were personally discussed with the patient.  Please see medical record for radiology report.    PROCEDURES:  Critical Care performed: No  Procedures   MEDICATIONS ORDERED IN ED: Medications - No data to  display   IMPRESSION / MDM / ASSESSMENT AND PLAN / ED COURSE  I reviewed the triage vital signs and the nursing notes.                              Differential diagnosis includes, but is not limited to, mucositis, herpangina, ulcer, abscess, cellulitis, musculoskeletal strain, CVA, TIA  Patient presenting to the ER for evaluation of symptoms as described above.  Based on symptoms, risk factors and considered above differential, this presenting complaint could reflect a potentially life-threatening illness therefore the patient will be placed on continuous pulse oximetry and telemetry for monitoring.  Laboratory evaluation will be sent to evaluate for the above complaints.  Patient is clinically well-appearing.  CT imaging ordered triage for the blood complaint does not show any sign of hemorrhage or mass on my review and  interpretation.  This is not clinically consistent with CVA or TIA.  Uncertain etiology of mouth pain but no sign of infectious process.  Recommended follow-up with dentist.  Her exam is reassuring and benign.  Bloodowkr is reassuring.  Patient does appear stable and appropriate for outpatient follow-up.       FINAL CLINICAL IMPRESSION(S) / ED DIAGNOSES   Final diagnoses:  Mouth pain  Upper extremity pain, anterior, unspecified laterality     Rx / DC Orders   ED Discharge Orders     None        Note:  This document was prepared using Dragon voice recognition software and may include unintentional dictation errors.    Willy Eddy, MD 10/14/23 [phone]

## 2023-10-14 NOTE — Discharge Instructions (Signed)
Follow up with PCP.  Return for any additional questions or concerns. °

## 2023-10-18 ENCOUNTER — Other Ambulatory Visit: Payer: Self-pay

## 2023-10-18 ENCOUNTER — Ambulatory Visit: Payer: Commercial Managed Care - PPO | Admitting: Nurse Practitioner

## 2023-10-18 ENCOUNTER — Encounter: Payer: Self-pay | Admitting: Nurse Practitioner

## 2023-10-18 VITALS — BP 104/60 | HR 107 | Temp 98.4°F | Ht <= 58 in | Wt 197.2 lb

## 2023-10-18 DIAGNOSIS — K1379 Other lesions of oral mucosa: Secondary | ICD-10-CM | POA: Insufficient documentation

## 2023-10-18 DIAGNOSIS — A084 Viral intestinal infection, unspecified: Secondary | ICD-10-CM | POA: Insufficient documentation

## 2023-10-18 LAB — COMPREHENSIVE METABOLIC PANEL
ALT: 18 U/L (ref 0–35)
AST: 19 U/L (ref 0–37)
Albumin: 4.1 g/dL (ref 3.5–5.2)
Alkaline Phosphatase: 56 U/L (ref 39–117)
BUN: 28 mg/dL — ABNORMAL HIGH (ref 6–23)
CO2: 25 meq/L (ref 19–32)
Calcium: 8.8 mg/dL (ref 8.4–10.5)
Chloride: 104 meq/L (ref 96–112)
Creatinine, Ser: 0.97 mg/dL (ref 0.40–1.20)
GFR: 63.21 mL/min (ref >60.00)
Glucose, Bld: 138 mg/dL — ABNORMAL HIGH (ref 70–99)
Potassium: 3.9 meq/L (ref 3.5–5.1)
Sodium: 139 meq/L (ref 135–145)
Total Bilirubin: 0.3 mg/dL (ref 0.2–1.2)
Total Protein: 7 g/dL (ref 6.0–8.3)

## 2023-10-18 LAB — CBC WITH DIFFERENTIAL/PLATELET
Basophils Absolute: 0 10*3/uL (ref 0.0–0.1)
Basophils Relative: 0.6 % (ref 0.0–3.0)
Eosinophils Absolute: 0 10*3/uL (ref 0.0–0.7)
Eosinophils Relative: 0.7 % (ref 0.0–5.0)
HCT: 35.8 % — ABNORMAL LOW (ref 36.0–46.0)
Hemoglobin: 11.6 g/dL — ABNORMAL LOW (ref 12.0–15.0)
Lymphocytes Relative: 16.7 % (ref 12.0–46.0)
Lymphs Abs: 1.2 10*3/uL (ref 0.7–4.0)
MCHC: 32.5 g/dL (ref 30.0–36.0)
MCV: 88.5 fL (ref 78.0–100.0)
Monocytes Absolute: 0.4 10*3/uL (ref 0.1–1.0)
Monocytes Relative: 5.2 % (ref 3.0–12.0)
Neutro Abs: 5.3 10*3/uL (ref 1.4–7.7)
Neutrophils Relative %: 76.8 % (ref 43.0–77.0)
Platelets: 297 10*3/uL (ref 150.0–400.0)
RBC: 4.05 Mil/uL (ref 3.87–5.11)
RDW: 15.3 % (ref 11.5–15.5)
WBC: 6.9 10*3/uL (ref 4.0–10.5)

## 2023-10-18 MED ORDER — ONDANSETRON 4 MG PO TBDP
4.0000 mg | ORAL_TABLET | Freq: Three times a day (TID) | ORAL | 0 refills | Status: DC | PRN
  Filled 2023-10-18 (×2): qty 20, 7d supply, fill #0

## 2023-10-18 NOTE — Assessment & Plan Note (Signed)
Recent episode of mouth numbness and arm soreness was evaluated in the emergency department with a normal workup. Symptoms have resolved. No further action is required at this time.

## 2023-10-18 NOTE — Assessment & Plan Note (Signed)
Recent onset of vomiting and diarrhea, likely viral. Vomiting has resolved, and diarrhea is improving. Mild dehydration is suspected. Continue supportive care with fluids and a bland diet as tolerated. Prescribe Zofran for nausea. Check labs as outlined. Return to care if symptoms persist or are worsening.

## 2023-10-18 NOTE — Progress Notes (Signed)
Tamara Dicker, NP-C Phone: [phone]  Tamara Wells is a 63 y.o. female who presents today for hospital follow up.   Discussed the use of AI scribe software for clinical note transcription with the patient, who gave verbal consent to proceed.  History of Present Illness   The patient, with a history of diabetes, presented with recent episodes of gastrointestinal symptoms and concerns about potential neurological symptoms. She reported an episode of mouth numbness and bilateral arm soreness approximately four days prior, which led to an emergency room visit. The symptoms have since resolved and the patient returned to work. However, she experienced a new onset of gastrointestinal symptoms, including vomiting and frequent diarrhea. The vomiting has since ceased, but the patient continues to experience frequent bowel movements. She reported taking over-the-counter Pepto Bismol for symptom relief. She has not had any episodes of diarrhea overnight or yet today. Denies fever or chills. Denies chest pain, shortness of breath, and abdominal pains.   She expressed concern about her symptoms, given the recent hospital visit for mouth numbness and arm soreness, which she feared might have been a stroke. However, the hospital workup, including a CT Head, was normal. ED notes, imaging and lab work reviewed.      Social History   Tobacco Use  Smoking Status Former   Current packs/day: 0.00   Types: Cigarettes   Quit date: 09/27/2004   Years since quitting: 19.0  Smokeless Tobacco Never    Current Outpatient Medications on File Prior to Visit  Medication Sig Dispense Refill   albuterol (VENTOLIN HFA) 108 (90 Base) MCG/ACT inhaler Inhale 2 puffs into the lungs every 4 hours as needed for wheezing. 6.7 g 0   aspirin EC 81 MG tablet Take 81 mg by mouth daily.     Black Cohosh 40 MG CAPS Take 40 mg by mouth 2 (two) times daily.     blood glucose meter kit and supplies Dispense based on patient and  insurance preference. Use up to four times daily as directed. (FOR ICD-10 E10.9, E11.9). 1 each 0   Blood Pressure Monitoring (OMRON 3 SERIES BP MONITOR) DEVI use to check blood pressure 1 each 0   Crisaborole (EUCRISA) 2 % OINT Apply 1 Application topically to affected skin on breast every morning. 60 g 2   fluticasone (FLONASE) 50 MCG/ACT nasal spray Place 2 sprays into both nostrils daily. 16 g 6   furosemide (LASIX) 20 MG tablet Take 1 tablet (20 mg total) by mouth every other day. 30 tablet 3   gabapentin (NEURONTIN) 100 MG capsule Take 1 capsule (100 mg total) by mouth 3 (three) times daily. 90 capsule 3   glipiZIDE (GLUCOTROL) 10 MG tablet TAKE 1 TABLET BY MOUTH TWICE DAILY 180 tablet 5   glucose blood test strip Test as directed up to 4 times a day 100 each 0   hydrOXYzine (VISTARIL) 25 MG capsule Take 1 capsule (25 mg total) by mouth every 8 (eight) hours as needed. 30 capsule 0   insulin glargine-yfgn (SEMGLEE, YFGN,) 100 UNIT/ML Pen Inject 40 Units into the skin daily. 15 mL 5   ketoconazole (NIZORAL) 2 % cream Apply 1 Application topically 2 (two) times daily under the breast. 60 g 5   Lancets (FREESTYLE) lancets Test up to 4 times a day as directed 100 each 0   latanoprost (XALATAN) 0.005 % ophthalmic solution instill 1 drop into affected eye(s) once daily in the evening 5 mL 3   latanoprost (XALATAN) 0.005 % ophthalmic  solution Place 1 drop into both eyes daily in the evening. 2.5 mL 0   latanoprost (XALATAN) 0.005 % ophthalmic solution Place 1 drop into affected eye(s) every evening. 5 mL 1   magic mouthwash (nystatin, lidocaine, diphenhydrAMINE, alum & mag hydroxide) suspension Swish and spit 5 mLs 3 (three) times daily as needed for mouth pain. 180 mL 0   meclizine (ANTIVERT) 25 MG tablet Take 25 mg by mouth 3 (three) times daily as needed for dizziness.      meloxicam (MOBIC) 15 MG tablet Take 1 tablet (15 mg total) by mouth daily with meal. 90 tablet 2   metFORMIN (GLUCOPHAGE)  1000 MG tablet Take 1 tablet (1,000 mg total) by mouth 2 (two) times daily. 180 tablet 2   mometasone (ELOCON) 0.1 % cream Apply 1 Application topically 2 (two) times daily under breast 3 times a week. 45 g 5   Multiple Vitamins-Minerals (CENTRAL-VITE PO) Take 1 tablet by mouth daily.     rosuvastatin (CRESTOR) 10 MG tablet Take 1 tablet (10 mg total) by mouth daily. 90 tablet 3   tirzepatide (MOUNJARO) 10 MG/0.5ML Pen Inject 10 mg into the skin once a week. 6 mL 2   tirzepatide (MOUNJARO) 12.5 MG/0.5ML Pen Inject 12.5 mg into the skin once a week. 6 mL 4   valsartan-hydrochlorothiazide (DIOVAN-HCT) 80-12.5 MG tablet Take 1 tablet by mouth daily. 90 tablet 3   No current facility-administered medications on file prior to visit.    ROS see history of present illness  Objective  Physical Exam Vitals:   10/18/23 0813  BP: 104/60  Pulse: (!) 107  Temp: 98.4 F (36.9 C)  SpO2: 94%    BP Readings from Last 3 Encounters:  10/18/23 104/60  10/14/23 131/84  09/29/23 116/76   Wt Readings from Last 3 Encounters:  10/18/23 197 lb 3.2 oz (89.4 kg)  10/14/23 200 lb (90.7 kg)  09/29/23 203 lb 3.2 oz (92.2 kg)    Physical Exam Constitutional:      General: She is not in acute distress.    Appearance: Normal appearance.  HENT:     Head: Normocephalic.     Mouth/Throat:     Mouth: Mucous membranes are moist.     Pharynx: Oropharynx is clear.  Cardiovascular:     Rate and Rhythm: Normal rate and regular rhythm.     Heart sounds: Normal heart sounds.  Pulmonary:     Effort: Pulmonary effort is normal.     Breath sounds: Normal breath sounds.  Abdominal:     General: Abdomen is flat. Bowel sounds are normal.     Palpations: Abdomen is soft.     Tenderness: There is no abdominal tenderness.  Skin:    General: Skin is warm and dry.  Neurological:     General: No focal deficit present.     Mental Status: She is alert.  Psychiatric:        Mood and Affect: Mood normal.         Behavior: Behavior normal.    Assessment/Plan: Please see individual problem list.  Viral gastroenteritis Assessment & Plan: Recent onset of vomiting and diarrhea, likely viral. Vomiting has resolved, and diarrhea is improving. Mild dehydration is suspected. Continue supportive care with fluids and a bland diet as tolerated. Prescribe Zofran for nausea. Check labs as outlined. Return to care if symptoms persist or are worsening.   Orders: -     CBC with Differential/Platelet -     Comprehensive metabolic panel -  Ondansetron; Take 1 tablet (4 mg total) by mouth every 8 (eight) hours as needed for nausea or vomiting.  Dispense: 20 tablet; Refill: 0  Mouth pain Assessment & Plan: Recent episode of mouth numbness and arm soreness was evaluated in the emergency department with a normal workup. Symptoms have resolved. No further action is required at this time.     Return if symptoms worsen or fail to improve.   Tamara Dicker, NP-C [facility]

## 2023-10-19 ENCOUNTER — Telehealth: Payer: Self-pay

## 2023-10-19 ENCOUNTER — Other Ambulatory Visit: Payer: Self-pay | Admitting: Nurse Practitioner

## 2023-10-19 DIAGNOSIS — G4733 Obstructive sleep apnea (adult) (pediatric): Secondary | ICD-10-CM | POA: Diagnosis not present

## 2023-10-19 DIAGNOSIS — D649 Anemia, unspecified: Secondary | ICD-10-CM

## 2023-10-19 NOTE — Telephone Encounter (Signed)
Vm left to cb in regards to labs   (Okay to relay labs please send msg back once done.)

## 2023-10-28 ENCOUNTER — Other Ambulatory Visit (INDEPENDENT_AMBULATORY_CARE_PROVIDER_SITE_OTHER): Payer: Commercial Managed Care - PPO

## 2023-10-28 DIAGNOSIS — D649 Anemia, unspecified: Secondary | ICD-10-CM | POA: Diagnosis not present

## 2023-10-28 LAB — CBC WITH DIFFERENTIAL/PLATELET
Basophils Absolute: 0.1 10*3/uL (ref 0.0–0.1)
Basophils Relative: 0.6 % (ref 0.0–3.0)
Eosinophils Absolute: 0.4 10*3/uL (ref 0.0–0.7)
Eosinophils Relative: 3.8 % (ref 0.0–5.0)
HCT: 33.8 % — ABNORMAL LOW (ref 36.0–46.0)
Hemoglobin: 11.1 g/dL — ABNORMAL LOW (ref 12.0–15.0)
Lymphocytes Relative: 31.8 % (ref 12.0–46.0)
Lymphs Abs: 3.1 10*3/uL (ref 0.7–4.0)
MCHC: 32.9 g/dL (ref 30.0–36.0)
MCV: 87.7 fL (ref 78.0–100.0)
Monocytes Absolute: 0.6 10*3/uL (ref 0.1–1.0)
Monocytes Relative: 6.2 % (ref 3.0–12.0)
Neutro Abs: 5.6 10*3/uL (ref 1.4–7.7)
Neutrophils Relative %: 57.6 % (ref 43.0–77.0)
Platelets: 379 10*3/uL (ref 150.0–400.0)
RBC: 3.85 Mil/uL — ABNORMAL LOW (ref 3.87–5.11)
RDW: 14.9 % (ref 11.5–15.5)
WBC: 9.6 10*3/uL (ref 4.0–10.5)

## 2023-11-01 ENCOUNTER — Ambulatory Visit: Payer: Commercial Managed Care - PPO | Admitting: Nurse Practitioner

## 2023-11-01 ENCOUNTER — Encounter: Payer: Self-pay | Admitting: Nurse Practitioner

## 2023-11-01 VITALS — BP 118/76 | HR 105 | Temp 98.3°F | Ht <= 58 in | Wt 201.8 lb

## 2023-11-01 DIAGNOSIS — R195 Other fecal abnormalities: Secondary | ICD-10-CM | POA: Diagnosis not present

## 2023-11-01 DIAGNOSIS — D649 Anemia, unspecified: Secondary | ICD-10-CM

## 2023-11-01 NOTE — Progress Notes (Signed)
 Established Patient Office Visit  Subjective:  Patient ID: Tamara Wells, female    DOB: 11/20/1960  Age: 63 y.o. MRN: [id number]  CC:  Chief Complaint  Patient presents with   Acute Visit    Lab results follow up   Discussed the use of a AI scribe software for clinical note transcription with the patient, who gave verbal consent to proceed.  HPI  Tamara Wells presents with concerns about her blood work. She experienced episodes of diarrhea and vomiting, initially suspected to be norovirus. These symptoms have since resolved. He reports an episode of mild numbness and bilateral arm soreness which prompted her visit to the ER on 1/17.   Her hemoglobin level was 12 during the initial ER visit, but it decreased to 11.6 and then to 11.1 over four days. She is concerned about these results, which led to today's visit.  During the period of diarrhea, she had a poor nutritional intake, consuming mostly soup. She continues to eat minimally, partly due to taking Mounjaro , which affects her appetite. She takes a daily multivitamin for energy and reports her energy level as 'okay' without feeling fatigued.  Denise blood in urine or bowel movements or hemoptysis.  States had dark stool.   Past Medical History:  Diagnosis Date   Diabetes mellitus without complication (HCC)    Dx 2006   Hyperlipidemia    Hypertension    Sleep apnea    Vertigo    2002    Past Surgical History:  Procedure Laterality Date   ABDOMINAL HYSTERECTOMY     2000   CARPAL TUNNEL RELEASE Left    CARPAL TUNNEL RELEASE Right 05/28/2019   Procedure: CARPAL TUNNEL RELEASE;  Surgeon: Cleotilde Barrio, MD;  Location: [facility] OR[facility];  Service: Orthopedics;  Laterality: Right;   COLONOSCOPY WITH PROPOFOL  N/A 03/21/2020   Procedure: COLONOSCOPY WITH PROPOFOL ;  Surgeon: Janalyn Keene NOVAK, MD;  Location: [facility];  Service: Endoscopy;  Laterality: N/A;   TONSILLECTOMY      Family History  Problem Relation Age of  Onset   Diabetes Mother    Diabetes Father    Diabetes Maternal Grandmother    Breast cancer Neg Hx     Social History   Socioeconomic History   Marital status: Married    Spouse name: Not on file   Number of children: Not on file   Years of education: Not on file   Highest education level: Not on file  Occupational History   Not on file  Tobacco Use   Smoking status: Former    Current packs/day: 0.00    Types: Cigarettes    Quit date: 09/27/2004    Years since quitting: 19.1   Smokeless tobacco: Never  Vaping Use   Vaping status: Never Used  Substance and Sexual Activity   Alcohol use: No   Drug use: No   Sexual activity: Not Currently    Birth control/protection: Surgical  Other Topics Concern   Not on file  Social History Narrative   Not on file   Social Drivers of Health   Financial Resource Strain: Not on file  Food Insecurity: Not on file  Transportation Needs: Not on file  Physical Activity: Not on file  Stress: Not on file  Social Connections: Not on file  Intimate Partner Violence: Not on file     Outpatient Medications Prior to Visit  Medication Sig Dispense Refill   albuterol  (VENTOLIN  HFA) 108 (90 Base) MCG/ACT inhaler Inhale 2 puffs into  the lungs every 4 hours as needed for wheezing. 6.7 g 0   aspirin EC 81 MG tablet Take 81 mg by mouth daily.     Black Cohosh 40 MG CAPS Take 40 mg by mouth 2 (two) times daily.     blood glucose meter kit and supplies Dispense based on patient and insurance preference. Use up to four times daily as directed. (FOR ICD-10 E10.9, E11.9). 1 each 0   Blood Pressure Monitoring (OMRON 3 SERIES BP MONITOR) DEVI use to check blood pressure 1 each 0   Crisaborole  (EUCRISA ) 2 % OINT Apply 1 Application topically to affected skin on breast every morning. 60 g 2   fluticasone  (FLONASE ) 50 MCG/ACT nasal spray Place 2 sprays into both nostrils daily. 16 g 6   furosemide  (LASIX ) 20 MG tablet Take 1 tablet (20 mg total) by mouth  every other day. 30 tablet 3   gabapentin  (NEURONTIN ) 100 MG capsule Take 1 capsule (100 mg total) by mouth 3 (three) times daily. 90 capsule 3   glipiZIDE  (GLUCOTROL ) 10 MG tablet TAKE 1 TABLET BY MOUTH TWICE DAILY 180 tablet 5   glucose blood test strip Test as directed up to 4 times a day 100 each 0   hydrOXYzine  (VISTARIL ) 25 MG capsule Take 1 capsule (25 mg total) by mouth every 8 (eight) hours as needed. 30 capsule 0   insulin  glargine-yfgn (SEMGLEE , YFGN,) 100 UNIT/ML Pen Inject 40 Units into the skin daily. 15 mL 5   ketoconazole  (NIZORAL ) 2 % cream Apply 1 Application topically 2 (two) times daily under the breast. 60 g 5   Lancets (FREESTYLE) lancets Test up to 4 times a day as directed 100 each 0   latanoprost  (XALATAN ) 0.005 % ophthalmic solution instill 1 drop into affected eye(s) once daily in the evening 5 mL 3   latanoprost  (XALATAN ) 0.005 % ophthalmic solution Place 1 drop into both eyes daily in the evening. 2.5 mL 0   latanoprost  (XALATAN ) 0.005 % ophthalmic solution Place 1 drop into affected eye(s) every evening. 5 mL 1   magic mouthwash (nystatin , lidocaine , diphenhydrAMINE , alum & mag hydroxide) suspension Swish and spit 5 mLs 3 (three) times daily as needed for mouth pain. 180 mL 0   meclizine (ANTIVERT) 25 MG tablet Take 25 mg by mouth 3 (three) times daily as needed for dizziness.      meloxicam  (MOBIC ) 15 MG tablet Take 1 tablet (15 mg total) by mouth daily with meal. 90 tablet 2   metFORMIN  (GLUCOPHAGE ) 1000 MG tablet Take 1 tablet (1,000 mg total) by mouth 2 (two) times daily. 180 tablet 2   mometasone  (ELOCON ) 0.1 % cream Apply 1 Application topically 2 (two) times daily under breast 3 times a week. 45 g 5   Multiple Vitamins-Minerals (CENTRAL-VITE PO) Take 1 tablet by mouth daily.     ondansetron  (ZOFRAN -ODT) 4 MG disintegrating tablet Take 1 tablet (4 mg total) by mouth every 8 (eight) hours as needed for nausea or vomiting. 20 tablet 0   rosuvastatin  (CRESTOR ) 10 MG  tablet Take 1 tablet (10 mg total) by mouth daily. 90 tablet 3   tirzepatide  (MOUNJARO ) 10 MG/0.5ML Pen Inject 10 mg into the skin once a week. 6 mL 2   tirzepatide  (MOUNJARO ) 12.5 MG/0.5ML Pen Inject 12.5 mg into the skin once a week. 6 mL 4   valsartan -hydrochlorothiazide  (DIOVAN -HCT) 80-12.5 MG tablet Take 1 tablet by mouth daily. 90 tablet 3   No facility-administered medications prior to visit.  Allergies  Allergen Reactions   No Known Allergies     ROS Review of Systems Negative unless indicated in HPI.    Objective:    Physical Exam Constitutional:      Appearance: Normal appearance.  Cardiovascular:     Rate and Rhythm: Normal rate and regular rhythm.     Pulses: Normal pulses.     Heart sounds: Normal heart sounds.  Pulmonary:     Effort: Pulmonary effort is normal.     Breath sounds: Normal breath sounds.  Musculoskeletal:     Cervical back: Normal range of motion.  Neurological:     General: No focal deficit present.     Mental Status: She is alert. Mental status is at baseline.  Psychiatric:        Mood and Affect: Mood normal.        Behavior: Behavior normal.        Thought Content: Thought content normal.        Judgment: Judgment normal.     BP 118/76   Pulse (!) 105   Temp 98.3 F (36.8 C)   Ht 4' 9 (1.448 m)   Wt 201 lb 12.8 oz (91.5 kg)   SpO2 95%   BMI 43.67 kg/m  Wt Readings from Last 3 Encounters:  11/01/23 201 lb 12.8 oz (91.5 kg)  10/18/23 197 lb 3.2 oz (89.4 kg)  10/14/23 200 lb (90.7 kg)     Health Maintenance  Topic Date Due   Pneumococcal Vaccine 29-11 Years old (1 of 2 - PCV) Never done   FOOT EXAM  Never done   Zoster Vaccines- Shingrix (1 of 2) Never done   Cervical Cancer Screening (HPV/Pap Cotest)  Never done   Colonoscopy  03/21/2021   OPHTHALMOLOGY EXAM  06/30/2022   COVID-19 Vaccine (4 - 2024-25 season) 11/16/2023 (Originally 05/29/2023)   MAMMOGRAM  12/27/2023   HEMOGLOBIN A1C  03/28/2024   Diabetic kidney  evaluation - Urine ACR  09/28/2024   Diabetic kidney evaluation - eGFR measurement  10/17/2024   DTaP/Tdap/Td (3 - Td or Tdap) 07/05/2032   INFLUENZA VACCINE  Completed   Hepatitis C Screening  Completed   HIV Screening  Completed   HPV VACCINES  Aged Out    There are no preventive care reminders to display for this patient.  Lab Results  Component Value Date   TSH 1.920 01/07/2023   Lab Results  Component Value Date   WBC 9.5 11/01/2023   HGB 11.1 (L) 11/01/2023   HCT 34.3 (L) 11/01/2023   MCV 88.9 11/01/2023   PLT 330.0 11/01/2023   Lab Results  Component Value Date   NA 139 10/18/2023   K 3.9 10/18/2023   CO2 25 10/18/2023   GLUCOSE 138 (H) 10/18/2023   BUN 28 (H) 10/18/2023   CREATININE 0.97 10/18/2023   BILITOT 0.3 10/18/2023   ALKPHOS 56 10/18/2023   AST 19 10/18/2023   ALT 18 10/18/2023   PROT 7.0 10/18/2023   ALBUMIN 4.1 10/18/2023   CALCIUM  8.8 10/18/2023   ANIONGAP 11 10/14/2023   EGFR 86 01/07/2023   GFR 63.21 10/18/2023   Lab Results  Component Value Date   CHOL 121 09/29/2023   Lab Results  Component Value Date   HDL 41.50 09/29/2023   Lab Results  Component Value Date   LDLCALC 48 09/29/2023   Lab Results  Component Value Date   TRIG 159.0 (H) 09/29/2023   Lab Results  Component Value Date   CHOLHDL  3 09/29/2023   Lab Results  Component Value Date   HGBA1C 7.1 (H) 09/29/2023      Assessment & Plan:  Anemia, unspecified type Assessment & Plan: Decreased hemoglobin from 12 to 11.1 over the course of a few days. No overt bleeding. Recent history of diarrhea and decreased oral intake. No fatigue reported. -Order complete blood count, iron  studies, and vitamin B12 levels today.  Orders: -     CBC with Differential/Platelet -     Vitamin B12 -     B12 and Folate Panel -     IBC + Ferritin  Dark stools Assessment & Plan: Will check stool for occult blood.   Orders: -     Fecal occult blood, imunochemical;  Future    Follow-up: No follow-ups on file.   Abagail Limb, NP

## 2023-11-01 NOTE — Patient Instructions (Signed)
Take magnesium oxide over the counter for headache.

## 2023-11-02 ENCOUNTER — Other Ambulatory Visit: Payer: Self-pay

## 2023-11-02 ENCOUNTER — Telehealth: Payer: Self-pay

## 2023-11-02 LAB — CBC WITH DIFFERENTIAL/PLATELET
Basophils Absolute: 0 10*3/uL (ref 0.0–0.1)
Basophils Relative: 0.5 % (ref 0.0–3.0)
Eosinophils Absolute: 0.4 10*3/uL (ref 0.0–0.7)
Eosinophils Relative: 4.2 % (ref 0.0–5.0)
HCT: 34.3 % — ABNORMAL LOW (ref 36.0–46.0)
Hemoglobin: 11.1 g/dL — ABNORMAL LOW (ref 12.0–15.0)
Lymphocytes Relative: 38.4 % (ref 12.0–46.0)
Lymphs Abs: 3.7 10*3/uL (ref 0.7–4.0)
MCHC: 32.3 g/dL (ref 30.0–36.0)
MCV: 88.9 fL (ref 78.0–100.0)
Monocytes Absolute: 0.5 10*3/uL (ref 0.1–1.0)
Monocytes Relative: 5.4 % (ref 3.0–12.0)
Neutro Abs: 4.9 10*3/uL (ref 1.4–7.7)
Neutrophils Relative %: 51.5 % (ref 43.0–77.0)
Platelets: 330 10*3/uL (ref 150.0–400.0)
RBC: 3.86 Mil/uL — ABNORMAL LOW (ref 3.87–5.11)
RDW: 15 % (ref 11.5–15.5)
WBC: 9.5 10*3/uL (ref 4.0–10.5)

## 2023-11-02 LAB — IBC + FERRITIN
Ferritin: 36.1 ng/mL (ref 10.0–291.0)
Iron: 45 ug/dL (ref 42–145)
Saturation Ratios: 12.5 % — ABNORMAL LOW (ref 20.0–50.0)
TIBC: 361.2 ug/dL (ref 250.0–450.0)
Transferrin: 258 mg/dL (ref 212.0–360.0)

## 2023-11-02 LAB — VITAMIN B12: Vitamin B-12: 533 pg/mL (ref 211–911)

## 2023-11-02 LAB — B12 AND FOLATE PANEL
Folate: >25.2 ng/mL (ref >5.9)
Vitamin B-12: 533 pg/mL (ref 211–911)

## 2023-11-02 NOTE — Telephone Encounter (Signed)
 FMLA forms received for pt you seen pt on 10-18-23   Pt did see her provider on yesterday as well.    Forms placed in provider to be signed folder

## 2023-11-05 DIAGNOSIS — D649 Anemia, unspecified: Secondary | ICD-10-CM | POA: Insufficient documentation

## 2023-11-05 DIAGNOSIS — R195 Other fecal abnormalities: Secondary | ICD-10-CM | POA: Insufficient documentation

## 2023-11-05 NOTE — Assessment & Plan Note (Signed)
 Will check stool for occult blood

## 2023-11-05 NOTE — Assessment & Plan Note (Signed)
 Decreased hemoglobin from 12 to 11.1 over the course of a few days. No overt bleeding. Recent history of diarrhea and decreased oral intake. No fatigue reported. -Order complete blood count, iron  studies, and vitamin B12 levels today.

## 2023-11-10 LAB — FECAL OCCULT BLOOD, IMMUNOCHEMICAL: Fecal Occult Bld: NEGATIVE

## 2023-11-11 ENCOUNTER — Other Ambulatory Visit: Payer: Self-pay

## 2023-11-11 ENCOUNTER — Other Ambulatory Visit: Payer: Self-pay | Admitting: Nurse Practitioner

## 2023-11-11 DIAGNOSIS — E119 Type 2 diabetes mellitus without complications: Secondary | ICD-10-CM

## 2023-11-11 MED ORDER — METFORMIN HCL 1000 MG PO TABS
1000.0000 mg | ORAL_TABLET | Freq: Two times a day (BID) | ORAL | 2 refills | Status: DC
  Filled 2023-11-11: qty 180, 90d supply, fill #0
  Filled 2024-03-10: qty 180, 90d supply, fill #1
  Filled 2024-07-02: qty 180, 90d supply, fill #2

## 2023-11-11 MED ORDER — ROSUVASTATIN CALCIUM 10 MG PO TABS
10.0000 mg | ORAL_TABLET | Freq: Every day | ORAL | 3 refills | Status: AC
  Filled 2023-11-11: qty 90, 90d supply, fill #0
  Filled 2023-11-24 – 2024-01-25 (×2): qty 90, 90d supply, fill #1
  Filled 2024-05-27: qty 90, 90d supply, fill #2
  Filled 2024-09-03: qty 90, 90d supply, fill #3

## 2023-11-11 MED FILL — Glipizide Tab 10 MG: ORAL | 90 days supply | Qty: 180 | Fill #3 | Status: AC

## 2023-11-19 DIAGNOSIS — G4733 Obstructive sleep apnea (adult) (pediatric): Secondary | ICD-10-CM | POA: Diagnosis not present

## 2023-11-24 ENCOUNTER — Other Ambulatory Visit: Payer: Self-pay | Admitting: Nurse Practitioner

## 2023-11-24 ENCOUNTER — Other Ambulatory Visit: Payer: Self-pay

## 2023-11-24 MED FILL — Furosemide Tab 20 MG: ORAL | 60 days supply | Qty: 30 | Fill #0 | Status: AC

## 2023-11-24 MED FILL — Crisaborole Oint 2%: CUTANEOUS | 30 days supply | Qty: 60 | Fill #1 | Status: AC

## 2023-11-25 ENCOUNTER — Other Ambulatory Visit: Payer: Self-pay

## 2023-11-30 ENCOUNTER — Other Ambulatory Visit: Payer: Self-pay

## 2023-12-01 ENCOUNTER — Ambulatory Visit: Payer: Commercial Managed Care - PPO | Admitting: Dermatology

## 2023-12-05 ENCOUNTER — Other Ambulatory Visit: Payer: Self-pay

## 2023-12-05 ENCOUNTER — Ambulatory Visit: Admitting: Dermatology

## 2023-12-05 DIAGNOSIS — Z7189 Other specified counseling: Secondary | ICD-10-CM

## 2023-12-05 DIAGNOSIS — B372 Candidiasis of skin and nail: Secondary | ICD-10-CM

## 2023-12-05 DIAGNOSIS — L304 Erythema intertrigo: Secondary | ICD-10-CM

## 2023-12-05 DIAGNOSIS — L2089 Other atopic dermatitis: Secondary | ICD-10-CM

## 2023-12-05 DIAGNOSIS — Z79899 Other long term (current) drug therapy: Secondary | ICD-10-CM

## 2023-12-05 DIAGNOSIS — L209 Atopic dermatitis, unspecified: Secondary | ICD-10-CM

## 2023-12-05 DIAGNOSIS — L819 Disorder of pigmentation, unspecified: Secondary | ICD-10-CM | POA: Diagnosis not present

## 2023-12-05 DIAGNOSIS — B379 Candidiasis, unspecified: Secondary | ICD-10-CM

## 2023-12-05 MED ORDER — EUCRISA 2 % EX OINT
1.0000 | TOPICAL_OINTMENT | Freq: Every morning | CUTANEOUS | 6 refills | Status: AC
  Filled 2023-12-05 – 2024-01-02 (×2): qty 60, 30d supply, fill #0
  Filled 2024-02-13 (×2): qty 60, 30d supply, fill #1
  Filled 2024-03-31: qty 60, 30d supply, fill #2
  Filled 2024-05-23: qty 60, 30d supply, fill #3
  Filled 2024-07-02: qty 60, 30d supply, fill #4
  Filled 2024-08-21: qty 60, 30d supply, fill #5
  Filled 2024-10-11 – 2024-10-31 (×3): qty 60, 30d supply, fill #6

## 2023-12-05 NOTE — Patient Instructions (Signed)

## 2023-12-05 NOTE — Progress Notes (Unsigned)
   Follow-Up Visit   Subjective  Tamara Wells is a 63 y.o. female who presents for the following: patient here for 6 month intertrigo follow up, She reports Tamara Wells has helped a lot. Patient reports she is not flared today. Hx of flares at b/l inframammary areas.   The patient has spots, moles and lesions to be evaluated, some may be new or changing and the patient may have concern these could be cancer.  The following portions of the chart were reviewed this encounter and updated as appropriate: medications, allergies, medical history  Review of Systems:  No other skin or systemic complaints except as noted in HPI or Assessment and Plan.  Objective  Well appearing patient in no apparent distress; mood and affect are within normal limits.  A focused examination was performed of the following areas: B/l inframammary areas   Relevant exam findings are noted in the Assessment and Plan.          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 Condition is improving with Eucrisa ointment as treatment but not currently at goal.  Intertrigo is a chronic recurrent rash that occurs in skin fold areas that may be associated with friction; heat; moisture; yeast; fungus; and bacteria.  It is exacerbated by increased movement / activity; sweating; and higher atmospheric temperature.   Atopic dermatitis (eczema) is a chronic, relapsing, pruritic condition that can significantly affect quality of life. It is often associated with allergic rhinitis and/or asthma and can require treatment with topical medications, phototherapy, or in severe cases biologic injectable medication (Dupixent; Adbry) or Oral JAK inhibitors.   Treatment Plan Continue  Eucrisa ointment apply in the morning  Continue as needed for flare Ketoconazole  cream apply at bedtime   INTERTRIGO   Related Medications Crisaborole (EUCRISA) 2 % OINT Apply 1 Application topically to affected skin on breast every morning.  Return in about 1 year (around 12/04/2024) for intertrigo follow up.  Tamara Wells, CMA, am acting as scribe for Tamara Sans, MD.   Documentation: I have reviewed the above documentation for accuracy and completeness, and I agree with the above.  Tamara Sans, MD

## 2023-12-06 ENCOUNTER — Encounter: Payer: Self-pay | Admitting: Dermatology

## 2023-12-06 ENCOUNTER — Other Ambulatory Visit: Payer: Self-pay

## 2023-12-08 ENCOUNTER — Ambulatory Visit: Admitting: Nurse Practitioner

## 2023-12-08 ENCOUNTER — Other Ambulatory Visit: Payer: Self-pay

## 2023-12-08 ENCOUNTER — Encounter: Payer: Self-pay | Admitting: Nurse Practitioner

## 2023-12-08 VITALS — BP 118/76 | HR 109 | Temp 98.1°F | Ht <= 58 in | Wt 205.0 lb

## 2023-12-08 DIAGNOSIS — E785 Hyperlipidemia, unspecified: Secondary | ICD-10-CM | POA: Diagnosis not present

## 2023-12-08 DIAGNOSIS — E119 Type 2 diabetes mellitus without complications: Secondary | ICD-10-CM | POA: Diagnosis not present

## 2023-12-08 DIAGNOSIS — R5383 Other fatigue: Secondary | ICD-10-CM

## 2023-12-08 DIAGNOSIS — K635 Polyp of colon: Secondary | ICD-10-CM

## 2023-12-08 DIAGNOSIS — G4733 Obstructive sleep apnea (adult) (pediatric): Secondary | ICD-10-CM | POA: Diagnosis not present

## 2023-12-08 DIAGNOSIS — Z794 Long term (current) use of insulin: Secondary | ICD-10-CM

## 2023-12-08 MED ORDER — IRON (FERROUS SULFATE) 325 (65 FE) MG PO TABS
325.0000 mg | ORAL_TABLET | Freq: Every day | ORAL | 0 refills | Status: DC
  Filled 2023-12-08: qty 30, 30d supply, fill #0
  Filled 2023-12-08: qty 30, fill #0

## 2023-12-08 NOTE — Assessment & Plan Note (Signed)
Referral sent for colonoscopy

## 2023-12-08 NOTE — Progress Notes (Signed)
 Established Patient Office Visit  Subjective:  Patient ID: Tamara Wells, female    DOB: [birth year]  Age: 63 y.o. MRN: [id number]  CC:  Chief Complaint  Patient presents with   Medical Management of Chronic Issues   Discussed the use of a AI scribe software for clinical note transcription with the patient, who gave verbal consent to proceed.  HPI  Tamara Wells presents for routine follow up. She has history of diabetes, hypertension, hyperlipidemia, sleep apnea on CPAP.  She experiences significant fatigue and decreased energy levels, feeling exhausted with minimal exertion such as walking short distances. He had low Hbg and HCT, with low iron saturation.  She experienced diarrhea last month and states she then   temporarily improved her energy levels, but she continues to feel unwell. She reports not going to the bathroom daily, sometimes going two to three days without a bowel movement.  She has been using a CPAP machine for sleep apnea for two and a half months but finds it uncomfortable and disruptive to her sleep. The machine makes it difficult to sleep, especially in hot conditions, and she often removes it during the night.  She has a history of diabetes and is monitored by a Control and instrumentation engineer. Her blood sugar levels ranging from 120 to 150. She experiences occasional foot pain and has regular retina checks due to diabetes-related issues in her right eye. She recently had an eye examination and new glasses in January.   HPI   Past Medical History:  Diagnosis Date   Diabetes mellitus without complication (HCC)    Dx 2006   Hyperlipidemia    Hypertension    Sleep apnea    Vertigo    2002    Past Surgical History:  Procedure Laterality Date   ABDOMINAL HYSTERECTOMY     2000   CARPAL TUNNEL RELEASE Left    CARPAL TUNNEL RELEASE Right 05/28/2019   Procedure: CARPAL TUNNEL RELEASE;  Surgeon: Deeann Saint, MD;  Location: [facility];  Service: Orthopedics;  Laterality:  Right;   COLONOSCOPY WITH PROPOFOL N/A 03/21/2020   Procedure: COLONOSCOPY WITH PROPOFOL;  Surgeon: Pasty Spillers, MD;  Location: [facility];  Service: Endoscopy;  Laterality: N/A;   TONSILLECTOMY      Family History  Problem Relation Age of Onset   Diabetes Mother    Diabetes Father    Diabetes Maternal Grandmother    Breast cancer Neg Hx     Social History   Socioeconomic History   Marital status: Married    Spouse name: Not on file   Number of children: Not on file   Years of education: Not on file   Highest education level: Not on file  Occupational History   Not on file  Tobacco Use   Smoking status: Former    Current packs/day: 0.00    Types: Cigarettes    Quit date: 09/27/2004    Years since quitting: 19.2   Smokeless tobacco: Never  Vaping Use   Vaping status: Never Used  Substance and Sexual Activity   Alcohol use: No   Drug use: No   Sexual activity: Not Currently    Birth control/protection: Surgical  Other Topics Concern   Not on file  Social History Narrative   Not on file   Social Drivers of Health   Financial Resource Strain: Not on file  Food Insecurity: Not on file  Transportation Needs: Not on file  Physical Activity: Not on file  Stress: Not on  file  Social Connections: Not on file  Intimate Partner Violence: Not on file     Outpatient Medications Prior to Visit  Medication Sig Dispense Refill   albuterol (VENTOLIN HFA) 108 (90 Base) MCG/ACT inhaler Inhale 2 puffs into the lungs every 4 hours as needed for wheezing. 6.7 g 0   aspirin EC 81 MG tablet Take 81 mg by mouth daily.     Black Cohosh 40 MG CAPS Take 40 mg by mouth 2 (two) times daily.     blood glucose meter kit and supplies Dispense based on patient and insurance preference. Use up to four times daily as directed. (FOR ICD-10 E10.9, E11.9). 1 each 0   Blood Pressure Monitoring (OMRON 3 SERIES BP MONITOR) DEVI use to check blood pressure 1 each 0   Crisaborole (EUCRISA)  2 % OINT Apply 1 Application topically to affected skin on breast every morning. 60 g 6   fluticasone (FLONASE) 50 MCG/ACT nasal spray Place 2 sprays into both nostrils daily. 16 g 6   furosemide (LASIX) 20 MG tablet Take 1 tablet (20 mg total) by mouth every other day. 30 tablet 3   gabapentin (NEURONTIN) 100 MG capsule Take 1 capsule (100 mg total) by mouth 3 (three) times daily. 90 capsule 3   glipiZIDE (GLUCOTROL) 10 MG tablet TAKE 1 TABLET BY MOUTH TWICE DAILY 180 tablet 5   glucose blood test strip Test as directed up to 4 times a day 100 each 0   hydrOXYzine (VISTARIL) 25 MG capsule Take 1 capsule (25 mg total) by mouth every 8 (eight) hours as needed. 30 capsule 0   insulin glargine-yfgn (SEMGLEE, YFGN,) 100 UNIT/ML Pen Inject 40 Units into the skin daily. 15 mL 5   ketoconazole (NIZORAL) 2 % cream Apply 1 Application topically 2 (two) times daily under the breast. 60 g 5   Lancets (FREESTYLE) lancets Test up to 4 times a day as directed 100 each 0   latanoprost (XALATAN) 0.005 % ophthalmic solution instill 1 drop into affected eye(s) once daily in the evening 5 mL 3   latanoprost (XALATAN) 0.005 % ophthalmic solution Place 1 drop into both eyes daily in the evening. 2.5 mL 0   latanoprost (XALATAN) 0.005 % ophthalmic solution Place 1 drop into affected eye(s) every evening. 5 mL 1   magic mouthwash (nystatin, lidocaine, diphenhydrAMINE, alum & mag hydroxide) suspension Swish and spit 5 mLs 3 (three) times daily as needed for mouth pain. 180 mL 0   meclizine (ANTIVERT) 25 MG tablet Take 25 mg by mouth 3 (three) times daily as needed for dizziness.      meloxicam (MOBIC) 15 MG tablet Take 1 tablet (15 mg total) by mouth daily with meal. 90 tablet 2   metFORMIN (GLUCOPHAGE) 1000 MG tablet Take 1 tablet (1,000 mg total) by mouth 2 (two) times daily. 180 tablet 2   mometasone (ELOCON) 0.1 % cream Apply 1 Application topically 2 (two) times daily under breast 3 times a week. 45 g 5   Multiple  Vitamins-Minerals (CENTRAL-VITE PO) Take 1 tablet by mouth daily.     ondansetron (ZOFRAN-ODT) 4 MG disintegrating tablet Take 1 tablet (4 mg total) by mouth every 8 (eight) hours as needed for nausea or vomiting. 20 tablet 0   rosuvastatin (CRESTOR) 10 MG tablet Take 1 tablet (10 mg total) by mouth daily. 90 tablet 3   tirzepatide (MOUNJARO) 10 MG/0.5ML Pen Inject 10 mg into the skin once a week. 6 mL 2  tirzepatide (MOUNJARO) 12.5 MG/0.5ML Pen Inject 12.5 mg into the skin once a week. 6 mL 4   valsartan-hydrochlorothiazide (DIOVAN-HCT) 80-12.5 MG tablet Take 1 tablet by mouth daily. 90 tablet 3   No facility-administered medications prior to visit.    Allergies  Allergen Reactions   No Known Allergies     ROS Review of Systems Negative unless indicated in HPI.    Objective:    Physical Exam  BP 118/76   Pulse (!) 109   Temp 98.1 F (36.7 C)   Ht 4\' 9"  (1.448 m)   Wt 205 lb (93 kg)   SpO2 93%   BMI 44.36 kg/m  Wt Readings from Last 3 Encounters:  12/08/23 205 lb (93 kg)  11/01/23 201 lb 12.8 oz (91.5 kg)  10/18/23 197 lb 3.2 oz (89.4 kg)     Health Maintenance  Topic Date Due   Pneumococcal Vaccine 70-78 Years old (1 of 2 - PCV) Never done   Zoster Vaccines- Shingrix (1 of 2) Never done   Cervical Cancer Screening (HPV/Pap Cotest)  Never done   Colonoscopy  03/21/2021   OPHTHALMOLOGY EXAM  06/30/2022   COVID-19 Vaccine (4 - 2024-25 season) 05/29/2023   MAMMOGRAM  12/27/2023   HEMOGLOBIN A1C  03/28/2024   Diabetic kidney evaluation - Urine ACR  09/28/2024   Diabetic kidney evaluation - eGFR measurement  10/17/2024   FOOT EXAM  12/07/2024   DTaP/Tdap/Td (3 - Td or Tdap) 07/05/2032   INFLUENZA VACCINE  Completed   Hepatitis C Screening  Completed   HIV Screening  Completed   HPV VACCINES  Aged Out    There are no preventive care reminders to display for this patient.  Lab Results  Component Value Date   TSH 1.920 01/07/2023   Lab Results  Component  Value Date   WBC 9.5 11/01/2023   HGB 11.1 (L) 11/01/2023   HCT 34.3 (L) 11/01/2023   MCV 88.9 11/01/2023   PLT 330.0 11/01/2023   Lab Results  Component Value Date   NA 139 10/18/2023   K 3.9 10/18/2023   CO2 25 10/18/2023   GLUCOSE 138 (H) 10/18/2023   BUN 28 (H) 10/18/2023   CREATININE 0.97 10/18/2023   BILITOT 0.3 10/18/2023   ALKPHOS 56 10/18/2023   AST 19 10/18/2023   ALT 18 10/18/2023   PROT 7.0 10/18/2023   ALBUMIN 4.1 10/18/2023   CALCIUM 8.8 10/18/2023   ANIONGAP 11 10/14/2023   EGFR 86 01/07/2023   GFR 63.21 10/18/2023   Lab Results  Component Value Date   CHOL 121 09/29/2023   Lab Results  Component Value Date   HDL 41.50 09/29/2023   Lab Results  Component Value Date   LDLCALC 48 09/29/2023   Lab Results  Component Value Date   TRIG 159.0 (H) 09/29/2023   Lab Results  Component Value Date   CHOLHDL 3 09/29/2023   Lab Results  Component Value Date   HGBA1C 7.1 (H) 09/29/2023      Assessment & Plan:  Other fatigue Assessment & Plan: Order CBC, vitamin D level, and thyroid function tests. - Recommend iron supplementation, prescribed or OTC. - Advise taking iron with orange juice, avoid coffee or milk within 30 minutes.  Orders: -     VITAMIN D 25 Hydroxy (Vit-D Deficiency, Fractures) -     CBC with Differential/Platelet -     TSH  Polyp of sigmoid colon, unspecified type Assessment & Plan: Referral sent for colonoscopy   Orders: -  Ambulatory referral to Gastroenterology  Type 2 diabetes mellitus without complication, with long-term current use of insulin (HCC) Assessment & Plan: Blood sugar levels 120-150 mg/dL. Diabetic retinopathy monitored by ophthalmologist. No foot swelling or joint pain. Lasix used for swelling. Reducing soda intake. - Perform foot examination. - Continue monitoring blood sugar.   Hyperlipidemia, unspecified hyperlipidemia type Assessment & Plan: Lab Results  Component Value Date   CHOL 121  09/29/2023   HDL 41.50 09/29/2023   LDLCALC 48 09/29/2023   TRIG 159.0 (H) 09/29/2023   CHOLHDL 3 09/29/2023  Advised patient to watch diet and follow regular exercise schedule. Continue Crestor 10 mg daily.    Severe obstructive sleep apnea Assessment & Plan: Stable of CPAP   Other orders -     Iron (Ferrous Sulfate); Take 325 mg by mouth daily.  Dispense: 30 tablet; Refill: 0    Follow-up: No follow-ups on file.   Kara Dies, NP

## 2023-12-08 NOTE — Assessment & Plan Note (Signed)
 Order CBC, vitamin D level, and thyroid function tests. - Recommend iron supplementation, prescribed or OTC. - Advise taking iron with orange juice, avoid coffee or milk within 30 minutes.

## 2023-12-08 NOTE — Assessment & Plan Note (Signed)
 Stable of CPAP

## 2023-12-08 NOTE — Patient Instructions (Signed)
 Ferrous sulfate 325 mg 1 tablet a day. Take it with orange use and avoid coffee and milk an hour before and after. Consume food containing high iron content: Chicken, [place], tuna, enriched cereal, asparagus, eggs, tofu etc

## 2023-12-08 NOTE — Assessment & Plan Note (Signed)
 Lab Results  Component Value Date   CHOL 121 09/29/2023   HDL 41.50 09/29/2023   LDLCALC 48 09/29/2023   TRIG 159.0 (H) 09/29/2023   CHOLHDL 3 09/29/2023  Advised patient to watch diet and follow regular exercise schedule. Continue Crestor 10 mg daily.

## 2023-12-08 NOTE — Assessment & Plan Note (Signed)
 Blood sugar levels 120-150 mg/dL. Diabetic retinopathy monitored by ophthalmologist. No foot swelling or joint pain. Lasix used for swelling. Reducing soda intake. - Perform foot examination. - Continue monitoring blood sugar.

## 2023-12-09 ENCOUNTER — Telehealth: Payer: Self-pay

## 2023-12-09 LAB — CBC WITH DIFFERENTIAL/PLATELET
Basophils Absolute: 0.1 10*3/uL (ref 0.0–0.1)
Basophils Relative: 0.7 % (ref 0.0–3.0)
Eosinophils Absolute: 0.4 10*3/uL (ref 0.0–0.7)
Eosinophils Relative: 4.1 % (ref 0.0–5.0)
HCT: 33.6 % — ABNORMAL LOW (ref 36.0–46.0)
Hemoglobin: 11 g/dL — ABNORMAL LOW (ref 12.0–15.0)
Lymphocytes Relative: 26.9 % (ref 12.0–46.0)
Lymphs Abs: 2.8 10*3/uL (ref 0.7–4.0)
MCHC: 32.8 g/dL (ref 30.0–36.0)
MCV: 89.2 fl (ref 78.0–100.0)
Monocytes Absolute: 0.6 10*3/uL (ref 0.1–1.0)
Monocytes Relative: 5.7 % (ref 3.0–12.0)
Neutro Abs: 6.4 10*3/uL (ref 1.4–7.7)
Neutrophils Relative %: 62.6 % (ref 43.0–77.0)
Platelets: 352 10*3/uL (ref 150.0–400.0)
RBC: 3.76 Mil/uL — ABNORMAL LOW (ref 3.87–5.11)
RDW: 15 % (ref 11.5–15.5)
WBC: 10.3 10*3/uL (ref 4.0–10.5)

## 2023-12-09 LAB — VITAMIN D 25 HYDROXY (VIT D DEFICIENCY, FRACTURES): VITD: 40.65 ng/mL (ref 30.00–100.00)

## 2023-12-09 LAB — TSH: TSH: 1.6 u[IU]/mL (ref 0.35–5.50)

## 2023-12-09 NOTE — Telephone Encounter (Signed)
 Copied from CRM [id number]. Topic: Clinical - Prescription Issue >> Dec 09, 2023  1:30 PM Gurney Maxin H wrote: Reason for CRM: Pharmacy states shortage on patients insulin glargine-yfgn (SEMGLEE, YFGN,) 100 UNIT/ML Pen, patient states pharmacy indicated the alternatives are [place] or [place] that's available. Please reach out to pharmacy on file with a replacement, thanks.  [place] [phone]

## 2023-12-12 ENCOUNTER — Encounter: Payer: Self-pay | Admitting: Nurse Practitioner

## 2023-12-12 ENCOUNTER — Other Ambulatory Visit: Payer: Self-pay

## 2023-12-12 ENCOUNTER — Other Ambulatory Visit: Payer: Self-pay | Admitting: Nurse Practitioner

## 2023-12-12 DIAGNOSIS — Z794 Long term (current) use of insulin: Secondary | ICD-10-CM

## 2023-12-12 MED ORDER — GABAPENTIN 100 MG PO CAPS
100.0000 mg | ORAL_CAPSULE | Freq: Three times a day (TID) | ORAL | 3 refills | Status: DC
  Filled 2023-12-12: qty 90, 30d supply, fill #0
  Filled 2024-01-16: qty 90, 30d supply, fill #1
  Filled 2024-02-09: qty 90, 30d supply, fill #2
  Filled 2024-03-31: qty 90, 30d supply, fill #3

## 2023-12-12 MED ORDER — INSULIN DEGLUDEC 100 UNIT/ML ~~LOC~~ SOPN: 40.0000 [IU] | PEN_INJECTOR | Freq: Every day | SUBCUTANEOUS | Status: DC

## 2023-12-12 MED ORDER — INSULIN DEGLUDEC 100 UNIT/ML ~~LOC~~ SOPN
40.0000 [IU] | PEN_INJECTOR | Freq: Every day | SUBCUTANEOUS | 0 refills | Status: DC
  Filled 2023-12-12: qty 12, 30d supply, fill #0

## 2023-12-12 NOTE — Telephone Encounter (Signed)
 It is okay to replace Semglee with tresiba (u-100), 40 units daily

## 2023-12-12 NOTE — Telephone Encounter (Signed)
 Attempted to call Patient and let her know that Tamara Wells is switching her to [place] since her Pharmacy is out of the [facility]- no answer and no voicemail.

## 2023-12-13 ENCOUNTER — Other Ambulatory Visit: Payer: Self-pay

## 2023-12-16 ENCOUNTER — Encounter: Payer: Self-pay | Admitting: Nurse Practitioner

## 2023-12-16 ENCOUNTER — Ambulatory Visit: Payer: Commercial Managed Care - PPO | Admitting: Nurse Practitioner

## 2023-12-16 VITALS — BP 126/72 | HR 99 | Temp 97.6°F | Ht <= 58 in | Wt 205.8 lb

## 2023-12-16 DIAGNOSIS — G4733 Obstructive sleep apnea (adult) (pediatric): Secondary | ICD-10-CM

## 2023-12-16 DIAGNOSIS — Z6841 Body Mass Index (BMI) 40.0 and over, adult: Secondary | ICD-10-CM

## 2023-12-16 NOTE — Assessment & Plan Note (Signed)
BMI 44. Healthy weight loss encouraged.  

## 2023-12-16 NOTE — Progress Notes (Signed)
 @Patient  ID: Tamara Wells, female    DOB: May 21, 1961, 63 y.o.   MRN: [id number]  Chief Complaint  Patient presents with   Follow-up    Reports full face is not working for her. No problems with pressure.     Referring provider: Kara Dies, NP  HPI: 63 year old female, former smoker followed for severe sleep apnea. Past medical history HTN, DM, anxiety, depression, obesity.   TEST/EVENTS:  08/11/2023 HST: AHI 30.8/h, SpO2 low 61%  08/05/2023: OV with Govani Radloff NP for sleep consult. She had a sleep study many years ago.  She was told that she needed a CPAP but was unable to afford this at this time.  She has since had a job change and got new insurance.  She has issues with loud snoring and restless sleep.  She does have some daytime tiredness but is more so after she works her 12-hour shift.  She has frequent daytime headaches.  No issues with drowsy driving, sleep parasomnia/paralysis.  No history of narcolepsy or symptoms of cataplexy. She goes to bed around 9:30 PM.  Falls asleep within 30 minutes.  Wakes once a night.  Gets up around 5:15 AM.  Does not operate any heavy machinery in her job Animal nutritionist.  Weight has fluctuated 10 pounds over the last 2 years.  Previous sleep study records not available.  Does not use any supplemental oxygen. She has a history of high blood pressure, diabetes well-controlled on current medication regimen.  No history of stroke. She is a former smoker.  Quit in 2006.  Seldomly drinks alcohol.  She usually drinks 3 cups of coffee a day.  Lives by herself.  Works as a Advertising copywriter.  Has 3 adult children.  Family history of cancer. [place] 4  09/29/2023: OV with [name] NP for follow up. She had a home sleep study which revealed severe sleep apnea. She feels unchanged compared to her last visit. She wants to start CPAP. She denies any drowsy driving, sleep parasomnias/paralysis.   12/16/2023: Today - follow up Patient presents today for follow up after starting on  CPAP therapy. She has been having difficulties with the mask, which causes her to take it off throughout the night. She does put it on at the beginning of the nights most nights. She has noticed that when she's slept all night with it, she wakes up feeling rejuvenated. She sleeps more restfully with it. She just wants a different mask than the full face she has. No sleep parasomnias/paralysis or drowsy driving.   Allergies  Allergen Reactions   No Known Allergies     Immunization History  Administered Date(s) Administered   Influenza-Unspecified 07/19/2016, 06/29/2021, 06/20/2022, 07/04/2023   PFIZER Comirnaty(Gray Top)Covid-19 Tri-Sucrose Vaccine 09/24/2019, 10/15/2019   Pfizer Covid-19 Vaccine Bivalent Booster 5y-11y 07/11/2020   Tdap 09/24/2019, 07/05/2022    Past Medical History:  Diagnosis Date   Diabetes mellitus without complication (HCC)    Dx 2006   Hyperlipidemia    Hypertension    Sleep apnea    Vertigo    2002    Tobacco History: Social History   Tobacco Use  Smoking Status Former   Current packs/day: 0.00   Types: Cigarettes   Quit date: 09/27/2004   Years since quitting: 19.2  Smokeless Tobacco Never   Counseling given: Not Answered   Outpatient Medications Prior to Visit  Medication Sig Dispense Refill   aspirin EC 81 MG tablet Take 81 mg by mouth daily.  Black Cohosh 40 MG CAPS Take 40 mg by mouth 2 (two) times daily.     blood glucose meter kit and supplies Dispense based on patient and insurance preference. Use up to four times daily as directed. (FOR ICD-10 E10.9, E11.9). 1 each 0   Blood Pressure Monitoring (OMRON 3 SERIES BP MONITOR) DEVI use to check blood pressure 1 each 0   Crisaborole (EUCRISA) 2 % OINT Apply 1 Application topically to affected skin on breast every morning. 60 g 6   furosemide (LASIX) 20 MG tablet Take 1 tablet (20 mg total) by mouth every other day. 30 tablet 3   gabapentin (NEURONTIN) 100 MG capsule Take 1 capsule (100 mg  total) by mouth 3 (three) times daily. 90 capsule 3   glipiZIDE (GLUCOTROL) 10 MG tablet TAKE 1 TABLET BY MOUTH TWICE DAILY 180 tablet 5   glucose blood test strip Test as directed up to 4 times a day 100 each 0   hydrOXYzine (VISTARIL) 25 MG capsule Take 1 capsule (25 mg total) by mouth every 8 (eight) hours as needed. 30 capsule 0   insulin degludec (TRESIBA) 100 UNIT/ML FlexTouch Pen Inject 40 Units into the skin daily. 12 mL 0   Iron, Ferrous Sulfate, 325 (65 Fe) MG TABS Take 325 mg by mouth daily. 30 tablet 0   Lancets (FREESTYLE) lancets Test up to 4 times a day as directed 100 each 0   latanoprost (XALATAN) 0.005 % ophthalmic solution instill 1 drop into affected eye(s) once daily in the evening 5 mL 3   latanoprost (XALATAN) 0.005 % ophthalmic solution Place 1 drop into both eyes daily in the evening. 2.5 mL 0   latanoprost (XALATAN) 0.005 % ophthalmic solution Place 1 drop into affected eye(s) every evening. 5 mL 1   meclizine (ANTIVERT) 25 MG tablet Take 25 mg by mouth 3 (three) times daily as needed for dizziness.      metFORMIN (GLUCOPHAGE) 1000 MG tablet Take 1 tablet (1,000 mg total) by mouth 2 (two) times daily. 180 tablet 2   Multiple Vitamins-Minerals (CENTRAL-VITE PO) Take 1 tablet by mouth daily.     rosuvastatin (CRESTOR) 10 MG tablet Take 1 tablet (10 mg total) by mouth daily. 90 tablet 3   tirzepatide (MOUNJARO) 12.5 MG/0.5ML Pen Inject 12.5 mg into the skin once a week. 6 mL 4   valsartan-hydrochlorothiazide (DIOVAN-HCT) 80-12.5 MG tablet Take 1 tablet by mouth daily. 90 tablet 3   albuterol (VENTOLIN HFA) 108 (90 Base) MCG/ACT inhaler Inhale 2 puffs into the lungs every 4 hours as needed for wheezing. (Patient not taking: Reported on 12/16/2023) 6.7 g 0   fluticasone (FLONASE) 50 MCG/ACT nasal spray Place 2 sprays into both nostrils daily. (Patient not taking: Reported on 12/16/2023) 16 g 6   insulin glargine-yfgn (SEMGLEE, YFGN,) 100 UNIT/ML Pen Inject 40 Units into the skin  daily. (Patient not taking: Reported on 12/16/2023) 15 mL 5   ketoconazole (NIZORAL) 2 % cream Apply 1 Application topically 2 (two) times daily under the breast. (Patient not taking: Reported on 12/16/2023) 60 g 5   magic mouthwash (nystatin, lidocaine, diphenhydrAMINE, alum & mag hydroxide) suspension Swish and spit 5 mLs 3 (three) times daily as needed for mouth pain. (Patient not taking: Reported on 12/16/2023) 180 mL 0   meloxicam (MOBIC) 15 MG tablet Take 1 tablet (15 mg total) by mouth daily with meal. (Patient not taking: Reported on 12/16/2023) 90 tablet 2   mometasone (ELOCON) 0.1 % cream Apply 1 Application  topically 2 (two) times daily under breast 3 times a week. (Patient not taking: Reported on 12/16/2023) 45 g 5   ondansetron (ZOFRAN-ODT) 4 MG disintegrating tablet Take 1 tablet (4 mg total) by mouth every 8 (eight) hours as needed for nausea or vomiting. (Patient not taking: Reported on 12/16/2023) 20 tablet 0   tirzepatide (MOUNJARO) 10 MG/0.5ML Pen Inject 10 mg into the skin once a week. (Patient not taking: Reported on 12/16/2023) 6 mL 2   No facility-administered medications prior to visit.     Review of Systems:   Constitutional: No weight loss or gain, night sweats, fevers, chills, or lassitude. +occasional fatigue (improved) HEENT: No difficulty swallowing, tooth/dental problems, or sore throat. No sneezing, itching, ear ache, nasal congestion, or post nasal drip. +frequent headaches  CV:  No chest pain, orthopnea, PND, swelling in lower extremities, anasarca, dizziness, palpitations, syncope Resp: +snoring (without CPAP); baseline shortness of breath with exertion. No excess mucus or change in color of mucus. No productive or non-productive. No hemoptysis. No wheezing.  No chest wall deformity GI:  No heartburn, indigestion GU: No dysuria, change in color of urine, urgency or frequency.   Skin: No rash, lesions, ulcerations MSK:  No joint pain or swelling.  Neuro: No dizziness  or lightheadedness.  Psych: +stable depression, anxiety. No SI/HI. Mood stable. +sleep disturbance     Physical Exam:  BP 126/72 (BP Location: Left Arm, Patient Position: Sitting, Cuff Size: Normal)   Pulse 99   Temp 97.6 F (36.4 C) (Temporal)   Ht 4\' 9"  (1.448 m)   Wt 205 lb 12.8 oz (93.4 kg)   SpO2 99%   BMI 44.53 kg/m   GEN: Pleasant, interactive, well-appearing; obese; in no acute distress HEENT:  Normocephalic and atraumatic. PERRLA. Sclera white. Nasal turbinates pink, moist and patent bilaterally. No rhinorrhea present. Oropharynx pink and moist, without exudate or edema. No lesions, ulcerations, or postnasal drip. Mallampati III NECK:  Supple w/ fair ROM. No JVD present. Normal carotid impulses w/o bruits. Thyroid symmetrical with no goiter or nodules palpated. No lymphadenopathy.   CV: RRR, no m/r/g, no peripheral edema. Pulses intact, +2 bilaterally. No cyanosis, pallor or clubbing. PULMONARY:  Unlabored, regular breathing. Clear bilaterally A&P w/o wheezes/rales/rhonchi. No accessory muscle use.  GI: BS present and normoactive. Soft, non-tender to palpation. No organomegaly or masses detected.  MSK: No erythema, warmth or tenderness. Cap refil <2 sec all extrem. No deformities or joint swelling noted.  Neuro: A/Ox3. No focal deficits noted.   Skin: Warm, no lesions or rashe Psych: Normal affect and behavior. Judgement and thought content appropriate.     Lab Results:  CBC    Component Value Date/Time   WBC 10.3 12/08/2023 1545   RBC 3.76 (L) 12/08/2023 1545   HGB 11.0 (L) 12/08/2023 1545   HGB 12.2 01/07/2023 1602   HCT 33.6 (L) 12/08/2023 1545   HCT 36.8 01/07/2023 1602   PLT 352.0 12/08/2023 1545   PLT 317 01/07/2023 1602   MCV 89.2 12/08/2023 1545   MCV 85 01/07/2023 1602   MCH 29.0 10/14/2023 0711   MCHC 32.8 12/08/2023 1545   RDW 15.0 12/08/2023 1545   RDW 14.0 01/07/2023 1602   LYMPHSABS 2.8 12/08/2023 1545   LYMPHSABS 4.3 (H) 01/07/2023 1602    MONOABS 0.6 12/08/2023 1545   EOSABS 0.4 12/08/2023 1545   EOSABS 0.0 01/07/2023 1602   BASOSABS 0.1 12/08/2023 1545   BASOSABS 0.0 01/07/2023 1602    BMET    Component Value  Date/Time   NA 139 10/18/2023 0827   NA 142 01/07/2023 1602   K 3.9 10/18/2023 0827   CL 104 10/18/2023 0827   CO2 25 10/18/2023 0827   GLUCOSE 138 (H) 10/18/2023 0827   BUN 28 (H) 10/18/2023 0827   BUN 22 01/07/2023 1602   CREATININE 0.97 10/18/2023 0827   CREATININE 0.62 08/04/2021 1043   CALCIUM 8.8 10/18/2023 0827   GFRNONAA >60 10/14/2023 0711   GFRNONAA 99 02/21/2020 0952   GFRAA 115 02/21/2020 0952    BNP No results found for: "BNP"   Imaging:  No results found.  Administration History     None           No data to display          No results found for: "NITRICOXIDE"      Assessment & Plan:   Severe obstructive sleep apnea Severe OSA on CPAP. Excellent control. She is receiving benefit from use. Having some difficulties with mask. We reviewed alternative options today. Provided her with two sample nasal cradle options today (N30 and N30i). She looks to be a medium or small wide. She will trial sizing and masks at home and notify which she prefers or if she is still having difficulties. Reviewed risks of untreated severe OSA. Encouraged her to increase usage. Safe driving practices reviewed. Understands proper care/use of CPAP.  Patient Instructions  Increase use of CPAP every night, minimum of 4-6 hours a night.  Change equipment as directed. Wash your tubing with warm soap and water daily, hang to dry. Wash humidifier portion weekly. Use bottled, distilled water and change daily Be aware of reduced alertness and do not drive or operate heavy machinery if experiencing this or drowsiness.  Exercise encouraged, as tolerated. Healthy weight management discussed.  Avoid or decrease alcohol consumption and medications that make you more sleepy, if possible. Notify if persistent  daytime sleepiness occurs even with consistent use of PAP therapy.   We discussed how untreated sleep apnea puts an individual at risk for cardiac arrhthymias, pulm HTN, DM, stroke and increases their risk for daytime accidents. We also briefly reviewed treatment options including weight loss, side sleeping position, oral appliance, CPAP therapy or referral to ENT for possible surgical options   Let me know if the pressures feel too high with the change in mask  Also let me know if you have mouth breathing still or dry mouth with the nasal mask and we can add on a chin strap or try a hybrid full face mask such as the DreamWear full face    Follow up in 4 weeks with Florentina Addison Pearlean Sabina,NP, or sooner, if needed    Morbid obesity with BMI of 40.0-44.9, adult (HCC) BMI 44. Healthy weight loss encouraged   Advised if symptoms do not improve or worsen, to please contact office for sooner follow up or seek emergency care.   I spent 35 minutes of dedicated to the care of this patient on the date of this encounter to include pre-visit review of records, face-to-face time with the patient discussing conditions above, post visit ordering of testing, clinical documentation with the electronic health record, making appropriate referrals as documented, and communicating necessary findings to members of the patients care team.  Noemi Chapel, NP 12/16/2023  Pt aware and understands NP's role.

## 2023-12-16 NOTE — Assessment & Plan Note (Addendum)
 Severe OSA on CPAP. Excellent control. She is receiving benefit from use. Having some difficulties with mask. We reviewed alternative options today. Provided her with two sample nasal cradle options today (N30 and N30i). She looks to be a medium or small wide. She will trial sizing and masks at home and notify which she prefers or if she is still having difficulties. Reviewed risks of untreated severe OSA. Encouraged her to increase usage. Safe driving practices reviewed. Understands proper care/use of CPAP.  Patient Instructions  Increase use of CPAP every night, minimum of 4-6 hours a night.  Change equipment as directed. Wash your tubing with warm soap and water daily, hang to dry. Wash humidifier portion weekly. Use bottled, distilled water and change daily Be aware of reduced alertness and do not drive or operate heavy machinery if experiencing this or drowsiness.  Exercise encouraged, as tolerated. Healthy weight management discussed.  Avoid or decrease alcohol consumption and medications that make you more sleepy, if possible. Notify if persistent daytime sleepiness occurs even with consistent use of PAP therapy.   We discussed how untreated sleep apnea puts an individual at risk for cardiac arrhthymias, pulm HTN, DM, stroke and increases their risk for daytime accidents. We also briefly reviewed treatment options including weight loss, side sleeping position, oral appliance, CPAP therapy or referral to ENT for possible surgical options   Let me know if the pressures feel too high with the change in mask  Also let me know if you have mouth breathing still or dry mouth with the nasal mask and we can add on a chin strap or try a hybrid full face mask such as the DreamWear full face    Follow up in 4 weeks with Katie Tiegan Terpstra,NP, or sooner, if needed

## 2023-12-16 NOTE — Patient Instructions (Signed)
 Increase use of CPAP every night, minimum of 4-6 hours a night.  Change equipment as directed. Wash your tubing with warm soap and water daily, hang to dry. Wash humidifier portion weekly. Use bottled, distilled water and change daily Be aware of reduced alertness and do not drive or operate heavy machinery if experiencing this or drowsiness.  Exercise encouraged, as tolerated. Healthy weight management discussed.  Avoid or decrease alcohol consumption and medications that make you more sleepy, if possible. Notify if persistent daytime sleepiness occurs even with consistent use of PAP therapy.   We discussed how untreated sleep apnea puts an individual at risk for cardiac arrhthymias, pulm HTN, DM, stroke and increases their risk for daytime accidents. We also briefly reviewed treatment options including weight loss, side sleeping position, oral appliance, CPAP therapy or referral to ENT for possible surgical options   Let me know if the pressures feel too high with the change in mask  Also let me know if you have mouth breathing still or dry mouth with the nasal mask and we can add on a chin strap or try a hybrid full face mask such as the DreamWear full face    Follow up in 4 weeks with Tamara Kashden Deboy,NP, or sooner, if needed

## 2023-12-17 DIAGNOSIS — G4733 Obstructive sleep apnea (adult) (pediatric): Secondary | ICD-10-CM | POA: Diagnosis not present

## 2023-12-20 ENCOUNTER — Telehealth: Payer: Self-pay | Admitting: Nurse Practitioner

## 2023-12-20 NOTE — Telephone Encounter (Signed)
 Please change to 5-12 cmH2O

## 2023-12-20 NOTE — Telephone Encounter (Signed)
 Pt sent a mychart message stating the pap pressure is too high.

## 2023-12-20 NOTE — Telephone Encounter (Signed)
 I have notified the patient. Nothing further needed.

## 2023-12-20 NOTE — Telephone Encounter (Signed)
 I have changed the settings through [employer]. ATC the patient but I did not get an answer. I will try again later.

## 2023-12-21 ENCOUNTER — Telehealth: Payer: Self-pay

## 2023-12-21 NOTE — Telephone Encounter (Signed)
 Pt requesting call back to schedule colonoscopy.

## 2023-12-22 NOTE — Telephone Encounter (Signed)
error 

## 2023-12-22 NOTE — Telephone Encounter (Signed)
 Tried to call patient but could not leave message on home number. Call cannot be completed on mobile number.

## 2023-12-26 ENCOUNTER — Encounter: Payer: Self-pay | Admitting: *Deleted

## 2023-12-27 DIAGNOSIS — G4733 Obstructive sleep apnea (adult) (pediatric): Secondary | ICD-10-CM

## 2023-12-27 NOTE — Telephone Encounter (Signed)
 This is a patient of Katie's. Since she is out of the office Maralyn Sago can you advise?

## 2023-12-28 ENCOUNTER — Other Ambulatory Visit: Payer: Self-pay | Admitting: *Deleted

## 2023-12-28 ENCOUNTER — Other Ambulatory Visit: Payer: Self-pay

## 2023-12-28 ENCOUNTER — Telehealth: Payer: Self-pay | Admitting: *Deleted

## 2023-12-28 DIAGNOSIS — Z860100 Personal history of colon polyps, unspecified: Secondary | ICD-10-CM

## 2023-12-28 MED ORDER — NA SULFATE-K SULFATE-MG SULF 17.5-3.13-1.6 GM/177ML PO SOLN
1.0000 | Freq: Once | ORAL | 0 refills | Status: AC
  Filled 2023-12-28: qty 354, 1d supply, fill #0

## 2023-12-28 NOTE — Telephone Encounter (Signed)
 The patient called in and left a voicemail requesting to speak with the nurse to schedule her colonoscopy.

## 2023-12-28 NOTE — Telephone Encounter (Signed)
 Gastroenterology Pre-Procedure Review  Request Date: 01/11/2024 Requesting Physician: Dr. Allegra Lai  PATIENT REVIEW QUESTIONS: The patient responded to the following health history questions as indicated:    1. Are you having any GI issues?  Patient mention bleeding but not sure from where 2. Do you have a personal history of Polyps? yes (last colonoscopy on 03/21/2020 with Dr Maximino Greenland) 3. Do you have a family history of Colon Cancer or Polyps? no 4. Diabetes Mellitus? yes (taking mounjaro, metformin, insulin, glipizide) 5. Joint replacements in the past 12 months?no 6. Major health problems in the past 3 months?no 7. Any artificial heart valves, MVP, or defibrillator?no    MEDICATIONS & ALLERGIES:    Patient reports the following regarding taking any anticoagulation/antiplatelet therapy:   Plavix, Coumadin, Eliquis, Xarelto, Lovenox, Pradaxa, Brilinta, or Effient? no Aspirin? yes (81 mg)  Patient confirms/reports the following medications:  Current Outpatient Medications  Medication Sig Dispense Refill   albuterol (VENTOLIN HFA) 108 (90 Base) MCG/ACT inhaler Inhale 2 puffs into the lungs every 4 hours as needed for wheezing. (Patient not taking: Reported on 12/16/2023) 6.7 g 0   aspirin EC 81 MG tablet Take 81 mg by mouth daily.     Black Cohosh 40 MG CAPS Take 40 mg by mouth 2 (two) times daily.     blood glucose meter kit and supplies Dispense based on patient and insurance preference. Use up to four times daily as directed. (FOR ICD-10 E10.9, E11.9). 1 each 0   Blood Pressure Monitoring (OMRON 3 SERIES BP MONITOR) DEVI use to check blood pressure 1 each 0   Crisaborole (EUCRISA) 2 % OINT Apply 1 Application topically to affected skin on breast every morning. 60 g 6   fluticasone (FLONASE) 50 MCG/ACT nasal spray Place 2 sprays into both nostrils daily. (Patient not taking: Reported on 12/16/2023) 16 g 6   furosemide (LASIX) 20 MG tablet Take 1 tablet (20 mg total) by mouth every other day.  30 tablet 3   gabapentin (NEURONTIN) 100 MG capsule Take 1 capsule (100 mg total) by mouth 3 (three) times daily. 90 capsule 3   glipiZIDE (GLUCOTROL) 10 MG tablet TAKE 1 TABLET BY MOUTH TWICE DAILY 180 tablet 5   glucose blood test strip Test as directed up to 4 times a day 100 each 0   hydrOXYzine (VISTARIL) 25 MG capsule Take 1 capsule (25 mg total) by mouth every 8 (eight) hours as needed. 30 capsule 0   insulin degludec (TRESIBA) 100 UNIT/ML FlexTouch Pen Inject 40 Units into the skin daily. 12 mL 0   insulin glargine-yfgn (SEMGLEE, YFGN,) 100 UNIT/ML Pen Inject 40 Units into the skin daily. (Patient not taking: Reported on 12/16/2023) 15 mL 5   Iron, Ferrous Sulfate, 325 (65 Fe) MG TABS Take 325 mg by mouth daily. 30 tablet 0   ketoconazole (NIZORAL) 2 % cream Apply 1 Application topically 2 (two) times daily under the breast. (Patient not taking: Reported on 12/16/2023) 60 g 5   Lancets (FREESTYLE) lancets Test up to 4 times a day as directed 100 each 0   latanoprost (XALATAN) 0.005 % ophthalmic solution instill 1 drop into affected eye(s) once daily in the evening 5 mL 3   latanoprost (XALATAN) 0.005 % ophthalmic solution Place 1 drop into both eyes daily in the evening. 2.5 mL 0   latanoprost (XALATAN) 0.005 % ophthalmic solution Place 1 drop into affected eye(s) every evening. 5 mL 1   magic mouthwash (nystatin, lidocaine, diphenhydrAMINE, alum & mag hydroxide)  suspension Swish and spit 5 mLs 3 (three) times daily as needed for mouth pain. (Patient not taking: Reported on 12/16/2023) 180 mL 0   meclizine (ANTIVERT) 25 MG tablet Take 25 mg by mouth 3 (three) times daily as needed for dizziness.      meloxicam (MOBIC) 15 MG tablet Take 1 tablet (15 mg total) by mouth daily with meal. (Patient not taking: Reported on 12/16/2023) 90 tablet 2   metFORMIN (GLUCOPHAGE) 1000 MG tablet Take 1 tablet (1,000 mg total) by mouth 2 (two) times daily. 180 tablet 2   mometasone (ELOCON) 0.1 % cream Apply 1  Application topically 2 (two) times daily under breast 3 times a week. (Patient not taking: Reported on 12/16/2023) 45 g 5   Multiple Vitamins-Minerals (CENTRAL-VITE PO) Take 1 tablet by mouth daily.     ondansetron (ZOFRAN-ODT) 4 MG disintegrating tablet Take 1 tablet (4 mg total) by mouth every 8 (eight) hours as needed for nausea or vomiting. (Patient not taking: Reported on 12/16/2023) 20 tablet 0   rosuvastatin (CRESTOR) 10 MG tablet Take 1 tablet (10 mg total) by mouth daily. 90 tablet 3   tirzepatide (MOUNJARO) 10 MG/0.5ML Pen Inject 10 mg into the skin once a week. (Patient not taking: Reported on 12/16/2023) 6 mL 2   tirzepatide (MOUNJARO) 12.5 MG/0.5ML Pen Inject 12.5 mg into the skin once a week. 6 mL 4   valsartan-hydrochlorothiazide (DIOVAN-HCT) 80-12.5 MG tablet Take 1 tablet by mouth daily. 90 tablet 3   No current facility-administered medications for this visit.    Patient confirms/reports the following allergies:  Allergies  Allergen Reactions   No Known Allergies     No orders of the defined types were placed in this encounter.   AUTHORIZATION INFORMATION Primary Insurance: 1D#: Group #:  Secondary Insurance: 1D#: Group #:  SCHEDULE INFORMATION: Date: 01/11/2024 Time: Location:  [facility]

## 2023-12-28 NOTE — Telephone Encounter (Signed)
 Colonoscopy schedule on 01/11/2024 with Dr Allegra Lai

## 2024-01-02 ENCOUNTER — Other Ambulatory Visit: Payer: Self-pay

## 2024-01-04 ENCOUNTER — Encounter: Payer: Self-pay | Admitting: Gastroenterology

## 2024-01-06 ENCOUNTER — Other Ambulatory Visit: Payer: Self-pay | Admitting: Nurse Practitioner

## 2024-01-06 ENCOUNTER — Other Ambulatory Visit: Payer: Self-pay

## 2024-01-06 MED ORDER — TRESIBA FLEXTOUCH 100 UNIT/ML ~~LOC~~ SOPN
40.0000 [IU] | PEN_INJECTOR | Freq: Every day | SUBCUTANEOUS | 0 refills | Status: DC
  Filled 2024-01-06: qty 12, 30d supply, fill #0

## 2024-01-11 ENCOUNTER — Ambulatory Visit
Admission: RE | Admit: 2024-01-11 | Discharge: 2024-01-11 | Disposition: A | Discharge status: Home / Self Care | Source: Ambulatory Visit | Attending: Gastroenterology | Admitting: Gastroenterology

## 2024-01-11 ENCOUNTER — Ambulatory Visit: Admitting: Anesthesiology

## 2024-01-11 ENCOUNTER — Encounter
Admission: RE | Disposition: A | Discharge status: Home / Self Care | Payer: Self-pay | Source: Ambulatory Visit | Attending: Gastroenterology

## 2024-01-11 DIAGNOSIS — Z7985 Long-term (current) use of injectable non-insulin antidiabetic drugs: Secondary | ICD-10-CM | POA: Insufficient documentation

## 2024-01-11 DIAGNOSIS — Z87891 Personal history of nicotine dependence: Secondary | ICD-10-CM | POA: Diagnosis not present

## 2024-01-11 DIAGNOSIS — Z860100 Personal history of colon polyps, unspecified: Secondary | ICD-10-CM | POA: Diagnosis not present

## 2024-01-11 DIAGNOSIS — Z6841 Body Mass Index (BMI) 40.0 and over, adult: Secondary | ICD-10-CM | POA: Diagnosis not present

## 2024-01-11 DIAGNOSIS — I10 Essential (primary) hypertension: Secondary | ICD-10-CM | POA: Insufficient documentation

## 2024-01-11 DIAGNOSIS — Z7984 Long term (current) use of oral hypoglycemic drugs: Secondary | ICD-10-CM | POA: Insufficient documentation

## 2024-01-11 DIAGNOSIS — E119 Type 2 diabetes mellitus without complications: Secondary | ICD-10-CM | POA: Insufficient documentation

## 2024-01-11 DIAGNOSIS — E66813 Obesity, class 3: Secondary | ICD-10-CM | POA: Insufficient documentation

## 2024-01-11 DIAGNOSIS — G4730 Sleep apnea, unspecified: Secondary | ICD-10-CM | POA: Diagnosis not present

## 2024-01-11 DIAGNOSIS — Z1211 Encounter for screening for malignant neoplasm of colon: Secondary | ICD-10-CM | POA: Insufficient documentation

## 2024-01-11 DIAGNOSIS — Z794 Long term (current) use of insulin: Secondary | ICD-10-CM | POA: Insufficient documentation

## 2024-01-11 HISTORY — PX: COLONOSCOPY: SHX5424

## 2024-01-11 LAB — GLUCOSE, CAPILLARY: Glucose-Capillary: 84 mg/dL (ref 70–99)

## 2024-01-11 SURGERY — COLONOSCOPY
Anesthesia: General

## 2024-01-11 MED ORDER — PROPOFOL 1000 MG/100ML IV EMUL
INTRAVENOUS | Status: AC
  Filled 2024-01-11: qty 100

## 2024-01-11 MED ORDER — PROPOFOL 10 MG/ML IV BOLUS
INTRAVENOUS | Status: DC | PRN
  Administered 2024-01-11: 125 ug/kg/min via INTRAVENOUS
  Administered 2024-01-11: 80 mg via INTRAVENOUS

## 2024-01-11 MED ORDER — DEXMEDETOMIDINE HCL IN NACL 80 MCG/20ML IV SOLN
INTRAVENOUS | Status: DC | PRN
Start: 2024-01-11 — End: 2024-01-11
  Administered 2024-01-11: 8 ug via INTRAVENOUS

## 2024-01-11 MED ORDER — LIDOCAINE HCL (PF) 2 % IJ SOLN
INTRAMUSCULAR | Status: AC
Start: 2024-01-11 — End: ?
  Filled 2024-01-11: qty 5

## 2024-01-11 MED ORDER — LIDOCAINE HCL (CARDIAC) PF 100 MG/5ML IV SOSY
PREFILLED_SYRINGE | INTRAVENOUS | Status: DC | PRN
  Administered 2024-01-11: 50 mg via INTRAVENOUS

## 2024-01-11 MED ORDER — SODIUM CHLORIDE 0.9 % IV SOLN
INTRAVENOUS | Status: DC
Start: 2024-01-11 — End: 2024-01-11
  Administered 2024-01-11: 20 mL/h via INTRAVENOUS

## 2024-01-11 NOTE — Anesthesia Preprocedure Evaluation (Addendum)
 Anesthesia Evaluation  Patient identified by MRN, date of birth, ID band Patient awake    Reviewed: Allergy & Precautions, H&P , NPO status , Patient's Chart, lab work & pertinent test results  Airway Mallampati: IV  TM Distance: >3 FB Neck ROM: full    Dental no notable dental hx.    Pulmonary sleep apnea , former smoker   Pulmonary exam normal        Cardiovascular Exercise Tolerance: Good hypertension, Normal cardiovascular exam     Neuro/Psych  PSYCHIATRIC DISORDERS      negative neurological ROS     GI/Hepatic negative GI ROS, Neg liver ROS,,,  Endo/Other  diabetes  Class 3 obesity  Renal/GU negative Renal ROS  negative genitourinary   Musculoskeletal   Abdominal  (+) + obese  Peds  Hematology negative hematology ROS (+)   Anesthesia Other Findings Past Medical History: No date: Diabetes mellitus without complication (HCC)     Comment:  Dx 2006 No date: Hyperlipidemia No date: Hypertension No date: Sleep apnea No date: Vertigo     Comment:  2002  Past Surgical History: No date: ABDOMINAL HYSTERECTOMY     Comment:  2000 No date: CARPAL TUNNEL RELEASE; Left 05/28/2019: CARPAL TUNNEL RELEASE; Right     Comment:  Procedure: CARPAL TUNNEL RELEASE;  Surgeon: Marlynn Singer, MD;  Location: [facility] ORS;  Service: Orthopedics;                Laterality: Right; 03/21/2020: COLONOSCOPY WITH PROPOFOL; N/A     Comment:  Procedure: COLONOSCOPY WITH PROPOFOL;  Surgeon:               Irby Mannan, MD;  Location: [facility] ENDOSCOPY;                Service: Endoscopy;  Laterality: N/A; No date: TONSILLECTOMY  BMI    Body Mass Index: 41.51 kg/m      Reproductive/Obstetrics negative OB ROS                             Anesthesia Physical Anesthesia Plan  ASA: 3  Anesthesia Plan: General   Post-op Pain Management: Minimal or no pain anticipated   Induction:   PONV  Risk Score and Plan: Propofol infusion and TIVA  Airway Management Planned: Natural Airway  Additional Equipment:   Intra-op Plan:   Post-operative Plan:   Informed Consent: I have reviewed the patients History and Physical, chart, labs and discussed the procedure including the risks, benefits and alternatives for the proposed anesthesia with the patient or authorized representative who has indicated his/her understanding and acceptance.     Dental Advisory Given  Plan Discussed with: CRNA and Surgeon  Anesthesia Plan Comments:         Anesthesia Quick Evaluation

## 2024-01-11 NOTE — Anesthesia Postprocedure Evaluation (Signed)
 Anesthesia Post Note  Patient: Tamara Wells  Procedure(s) Performed: COLONOSCOPY  Patient location during evaluation: PACU Anesthesia Type: General Level of consciousness: awake and alert Pain management: pain level controlled Vital Signs Assessment: post-procedure vital signs reviewed and stable Respiratory status: spontaneous breathing, nonlabored ventilation and respiratory function stable Cardiovascular status: blood pressure returned to baseline and stable Postop Assessment: no apparent nausea or vomiting Anesthetic complications: no   No notable events documented.   Last Vitals:  Vitals:   01/11/24 0837 01/11/24 0846  BP: 113/74 111/63  Pulse: 82 81  Resp: 17 17  Temp:    SpO2: 100% 100%    Last Pain:  Vitals:   01/11/24 0846  TempSrc:   PainSc: 0-No pain                 Baltazar Bonier

## 2024-01-11 NOTE — Op Note (Signed)
 [facility] Gastroenterology Patient Name: Tamara Wells Procedure Date: 01/11/2024 7:14 AM MRN: [id number] Account #: [id number] Date of Birth: [birth year] Admit Type: Outpatient Age: 63 Room: [facility] ENDO ROOM 1 Gender: Female Note Status: Finalized Instrument Name: Prentice Docker [id number] Procedure:             Colonoscopy Indications:           Screening for colorectal malignant neoplasm, Screening                         for colorectal malignant neoplasm, inadequate bowel                         prep on last colonoscopy (more recent than 10 years                         ago), Last colonoscopy: June 2021 Providers:             Toney Reil MD, MD Referring MD:          Kara Dies (Referring MD) Medicines:             General Anesthesia Complications:         No immediate complications. Estimated blood loss: None. Procedure:             Pre-Anesthesia Assessment:                        - Prior to the procedure, a History and Physical was                         performed, and patient medications and allergies were                         reviewed. The patient is competent. The risks and                         benefits of the procedure and the sedation options and                         risks were discussed with the patient. All questions                         were answered and informed consent was obtained.                         Patient identification and proposed procedure were                         verified by the physician, the nurse, the                         anesthesiologist, the anesthetist and the technician                         in the pre-procedure area in the procedure room in the                         endoscopy suite. Mental Status Examination: alert and  oriented. Airway Examination: normal oropharyngeal                         airway and neck mobility. Respiratory Examination:                         clear  to auscultation. CV Examination: normal.                         Prophylactic Antibiotics: The patient does not require                         prophylactic antibiotics. Prior Anticoagulants: The                         patient has taken no anticoagulant or antiplatelet                         agents. ASA Grade Assessment: III - A patient with                         severe systemic disease. After reviewing the risks and                         benefits, the patient was deemed in satisfactory                         condition to undergo the procedure. The anesthesia                         plan was to use general anesthesia. Immediately prior                         to administration of medications, the patient was                         re-assessed for adequacy to receive sedatives. The                         heart rate, respiratory rate, oxygen saturations,                         blood pressure, adequacy of pulmonary ventilation, and                         response to care were monitored throughout the                         procedure. The physical status of the patient was                         re-assessed after the procedure.                        After obtaining informed consent, the colonoscope was                         passed under direct vision. Throughout the procedure,  the patient's blood pressure, pulse, and oxygen                         saturations were monitored continuously. The                         Colonoscope was introduced through the anus and                         advanced to the the cecum, identified by appendiceal                         orifice and ileocecal valve. The colonoscopy was                         performed without difficulty. The patient tolerated                         the procedure well. The quality of the bowel                         preparation was evaluated using the BBPS [facility] Bowel                          Preparation Scale) with scores of: Right Colon = 3,                         Transverse Colon = 3 and Left Colon = 3 (entire mucosa                         seen well with no residual staining, small fragments                         of stool or opaque liquid). The total BBPS score                         equals 9. The ileocecal valve, appendiceal orifice,                         and rectum were photographed. Findings:      The perianal and digital rectal examinations were normal. Pertinent       negatives include normal sphincter tone and no palpable rectal lesions.      The entire examined colon appeared normal.      The retroflexed view of the distal rectum and anal verge was normal and       showed no anal or rectal abnormalities. Impression:            - The entire examined colon is normal.                        - The distal rectum and anal verge are normal on                         retroflexion view.                        - No specimens collected. Recommendation:        -  Discharge patient to home (with escort).                        - Resume previous diet today.                        - Continue present medications.                        - Repeat colonoscopy in 10 years for screening                         purposes. Procedure Code(s):     --- Professional ---                        N8295, Colorectal cancer screening; colonoscopy on                         individual not meeting criteria for high risk Diagnosis Code(s):     --- Professional ---                        Z12.11, Encounter for screening for malignant neoplasm                         of colon CPT copyright 2022 American [employer] Association. All rights reserved. The codes documented in this report are preliminary and upon coder review may  be revised to meet current compliance requirements. Dr. Evia Hof Selena Daily MD, MD 01/11/2024 8:19:47 AM This report has been signed electronically. Number of  Addenda: 0 Note Initiated On: 01/11/2024 7:14 AM Scope Withdrawal Time: 0 hours 6 minutes 24 seconds  Total Procedure Duration: 0 hours 11 minutes 36 seconds  Estimated Blood Loss:  Estimated blood loss: none.      [facility]

## 2024-01-11 NOTE — Anesthesia Procedure Notes (Signed)
 Date/Time: 01/11/2024 8:05 AM  Performed by: Angelia Kelp, CRNAPre-anesthesia Checklist: Patient identified, Emergency Drugs available, Suction available, Patient being monitored and Timeout performed Patient Re-evaluated:Patient Re-evaluated prior to induction Oxygen Delivery Method: Nasal cannula Preoxygenation: Pre-oxygenation with 100% oxygen Induction Type: IV induction

## 2024-01-11 NOTE — Transfer of Care (Signed)
 Immediate Anesthesia Transfer of Care Note  Patient: Tamara Wells  Procedure(s) Performed: COLONOSCOPY  Patient Location: Endoscopy Unit  Anesthesia Type:General  Level of Consciousness: awake, alert , and oriented  Airway & Oxygen Therapy: Patient Spontanous Breathing  Post-op Assessment: Report given to RN and Post -op Vital signs reviewed and stable  Post vital signs: Reviewed and stable  Last Vitals:  Vitals Value Taken Time  BP 105/52 01/11/24 0828  Temp 36.1 C 01/11/24 0828  Pulse 84 01/11/24 0828  Resp 18 01/11/24 0828  SpO2 100 % 01/11/24 0828    Last Pain:  Vitals:   01/11/24 0828  TempSrc: Temporal  PainSc: 0-No pain         Complications: No notable events documented.

## 2024-01-11 NOTE — H&P (Signed)
 Karma Oz, MD [address]  [address]  [place], [place] [address]  Main: [phone]  Fax: [phone] Pager: [phone]  Primary Care Physician:  Tona Francis, NP Primary Gastroenterologist:  Dr. Karma Oz  Pre-Procedure History & Physical: HPI:  Tamara Wells is a 63 y.o. female is here for an colonoscopy.   Past Medical History:  Diagnosis Date   Diabetes mellitus without complication (HCC)    Dx 2006   Hyperlipidemia    Hypertension    Sleep apnea    Vertigo    2002    Past Surgical History:  Procedure Laterality Date   ABDOMINAL HYSTERECTOMY     2000   CARPAL TUNNEL RELEASE Left    CARPAL TUNNEL RELEASE Right 05/28/2019   Procedure: CARPAL TUNNEL RELEASE;  Surgeon: Marlynn Singer, MD;  Location: [facility] ORS;  Service: Orthopedics;  Laterality: Right;   COLONOSCOPY WITH PROPOFOL N/A 03/21/2020   Procedure: COLONOSCOPY WITH PROPOFOL;  Surgeon: Irby Mannan, MD;  Location: [facility] ENDOSCOPY;  Service: Endoscopy;  Laterality: N/A;   TONSILLECTOMY      Prior to Admission medications   Medication Sig Start Date End Date Taking? Authorizing Provider  aspirin EC 81 MG tablet Take 81 mg by mouth daily.   Yes [provider]  Black Cohosh 40 MG CAPS Take 40 mg by mouth 2 (two) times daily.   Yes [provider]  blood glucose meter kit and supplies Dispense based on patient and insurance preference. Use up to four times daily as directed. (FOR ICD-10 E10.9, E11.9). 03/19/22  Yes Kaur, Charanpreet, NP  Blood Pressure Monitoring (OMRON 3 SERIES BP MONITOR) DEVI use to check blood pressure 05/06/22  Yes Nicks, Altha Jester, RPH  Crisaborole (EUCRISA) 2 % OINT Apply 1 Application topically to affected skin on breast every morning. 12/05/23  Yes Elta Halter, MD  furosemide (LASIX) 20 MG tablet Take 1 tablet (20 mg total) by mouth every other day. 11/24/23  Yes Kaur, Charanpreet, NP  gabapentin (NEURONTIN) 100 MG capsule Take 1 capsule  (100 mg total) by mouth 3 (three) times daily. 12/12/23  Yes Kaur, Charanpreet, NP  glipiZIDE (GLUCOTROL) 10 MG tablet TAKE 1 TABLET BY MOUTH TWICE DAILY 02/10/23 02/12/24 Yes Tona Francis, NP  glucose blood test strip Test as directed up to 4 times a day 03/19/22  Yes Kaur, Charanpreet, NP  hydrOXYzine (VISTARIL) 25 MG capsule Take 1 capsule (25 mg total) by mouth every 8 (eight) hours as needed. 05/17/23  Yes Kaur, Charanpreet, NP  insulin degludec (TRESIBA FLEXTOUCH) 100 UNIT/ML FlexTouch Pen Inject 40 Units into the skin daily. 01/06/24  Yes Kaur, Charanpreet, NP  Iron, Ferrous Sulfate, 325 (65 Fe) MG TABS Take 325 mg by mouth daily. 12/08/23  Yes Tona Francis, NP  Lancets (FREESTYLE) lancets Test up to 4 times a day as directed 03/19/22  Yes Kaur, Charanpreet, NP  latanoprost (XALATAN) 0.005 % ophthalmic solution instill 1 drop into affected eye(s) once daily in the evening 07/29/22  Yes   latanoprost (XALATAN) 0.005 % ophthalmic solution Place 1 drop into both eyes daily in the evening. 10/06/22  Yes   latanoprost (XALATAN) 0.005 % ophthalmic solution Place 1 drop into affected eye(s) every evening. 10/11/23  Yes   meclizine (ANTIVERT) 25 MG tablet Take 25 mg by mouth 3 (three) times daily as needed for dizziness.    Yes Theron Flavin, MD  metFORMIN (GLUCOPHAGE) 1000 MG tablet Take 1 tablet (1,000 mg total) by mouth 2 (two)  times daily. 11/11/23 11/10/24 Yes Arnett, Hanley Lew, FNP  Multiple Vitamins-Minerals (CENTRAL-VITE PO) Take 1 tablet by mouth daily.   Yes [provider]  rosuvastatin (CRESTOR) 10 MG tablet Take 1 tablet (10 mg total) by mouth daily. 11/11/23  Yes Kaur, Charanpreet, NP  tirzepatide [facility]) 10 MG/0.5ML Pen Inject 10 mg into the skin once a week. 05/17/23  Yes Kaur, Charanpreet, NP  tirzepatide [facility]) 12.5 MG/0.5ML Pen Inject 12.5 mg into the skin once a week. 09/29/23  Yes Kaur, Charanpreet, NP  valsartan-hydrochlorothiazide (DIOVAN-HCT) 80-12.5 MG tablet Take 1  tablet by mouth daily. 06/23/23  Yes Kaur, Charanpreet, NP  albuterol (VENTOLIN HFA) 108 (90 Base) MCG/ACT inhaler Inhale 2 puffs into the lungs every 4 hours as needed for wheezing. Patient not taking: Reported on 12/16/2023 12/27/22   Kaur, Charanpreet, NP  fluticasone (FLONASE) 50 MCG/ACT nasal spray Place 2 sprays into both nostrils daily. Patient not taking: Reported on 12/16/2023 04/21/23   Kaur, Charanpreet, NP  insulin glargine-yfgn (SEMGLEE, YFGN,) 100 UNIT/ML Pen Inject 40 Units into the skin daily. Patient not taking: Reported on 12/16/2023 07/29/23   Kaur, Charanpreet, NP  ketoconazole (NIZORAL) 2 % cream Apply 1 Application topically 2 (two) times daily under the breast. Patient not taking: Reported on 12/16/2023 11/25/22   Elta Halter, MD  magic mouthwash (nystatin, lidocaine, diphenhydrAMINE, alum & mag hydroxide) suspension Swish and spit 5 mLs 3 (three) times daily as needed for mouth pain. Patient not taking: Reported on 12/16/2023 05/17/23   Kaur, Charanpreet, NP  meloxicam (MOBIC) 15 MG tablet Take 1 tablet (15 mg total) by mouth daily with meal. Patient not taking: Reported on 12/16/2023 11/18/22     mometasone (ELOCON) 0.1 % cream Apply 1 Application topically 2 (two) times daily under breast 3 times a week. Patient not taking: Reported on 12/16/2023 11/25/22   Elta Halter, MD  ondansetron (ZOFRAN-ODT) 4 MG disintegrating tablet Take 1 tablet (4 mg total) by mouth every 8 (eight) hours as needed for nausea or vomiting. Patient not taking: Reported on 12/16/2023 10/18/23   Bluford Burkitt, NP    Allergies as of 12/28/2023 - Review Complete 12/16/2023  Allergen Reaction Noted   No known allergies  03/06/2020    Family History  Problem Relation Age of Onset   Diabetes Mother    Diabetes Father    Diabetes Maternal Grandmother    Breast cancer Neg Hx     Social History   Socioeconomic History   Marital status: Married    Spouse name: Not on file   Number of children: Not  on file   Years of education: Not on file   Highest education level: Not on file  Occupational History   Not on file  Tobacco Use   Smoking status: Former    Current packs/day: 0.00    Types: Cigarettes    Quit date: 09/27/2004    Years since quitting: 19.3   Smokeless tobacco: Never  Vaping Use   Vaping status: Never Used  Substance and Sexual Activity   Alcohol use: No   Drug use: No   Sexual activity: Not Currently    Birth control/protection: Surgical  Other Topics Concern   Not on file  Social History Narrative   Not on file   Social Drivers of Health   Financial Resource Strain: Not on file  Food Insecurity: Not on file  Transportation Needs: Not on file  Physical Activity: Not on file  Stress: Not on file  Social  Connections: Not on file  Intimate Partner Violence: Not on file    Review of Systems: See HPI, otherwise negative ROS  Physical Exam: BP (!) 131/92   Pulse 88   Temp (!) 96.4 F (35.8 C) (Temporal)   Resp 20   Ht 4\' 10"  (1.473 m)   Wt 90.1 kg   SpO2 100%   BMI 41.51 kg/m  General:   Alert,  pleasant and cooperative in NAD Head:  Normocephalic and atraumatic. Neck:  Supple; no masses or thyromegaly. Lungs:  Clear throughout to auscultation.    Heart:  Regular rate and rhythm. Abdomen:  Soft, nontender and nondistended. Normal bowel sounds, without guarding, and without rebound.   Neurologic:  Alert and  oriented x4;  grossly normal neurologically.  Impression/Plan: APPLE DEARMAS is here for an colonoscopy to be performed for colon cancer screening  Risks, benefits, limitations, and alternatives regarding  colonoscopy have been reviewed with the patient.  Questions have been answered.  All parties agreeable.   Ellis Guys, MD  01/11/2024, 7:53 AM

## 2024-01-12 ENCOUNTER — Encounter: Payer: Self-pay | Admitting: Gastroenterology

## 2024-01-17 DIAGNOSIS — G4733 Obstructive sleep apnea (adult) (pediatric): Secondary | ICD-10-CM | POA: Diagnosis not present

## 2024-01-20 ENCOUNTER — Encounter: Payer: Self-pay | Admitting: Nurse Practitioner

## 2024-01-20 ENCOUNTER — Ambulatory Visit: Admitting: Nurse Practitioner

## 2024-01-20 VITALS — BP 138/80 | HR 98 | Ht <= 58 in | Wt 206.0 lb

## 2024-01-20 DIAGNOSIS — G4733 Obstructive sleep apnea (adult) (pediatric): Secondary | ICD-10-CM

## 2024-01-20 DIAGNOSIS — E66813 Obesity, class 3: Secondary | ICD-10-CM

## 2024-01-20 DIAGNOSIS — Z6841 Body Mass Index (BMI) 40.0 and over, adult: Secondary | ICD-10-CM | POA: Diagnosis not present

## 2024-01-20 NOTE — Assessment & Plan Note (Signed)
BMI 43. Healthy weight loss encouraged.

## 2024-01-20 NOTE — Progress Notes (Signed)
 @Patient  ID: Tamara Wells, female    DOB: Feb 20, 1961, 63 y.o.   MRN: [id number]  Chief Complaint  Patient presents with   Follow-up    Follow up c-pap , pt just got new last , pt stated that it blowing to strong     Referring provider: Tona Francis, NP  HPI: 63 year old female, former smoker followed for severe sleep apnea. Past medical history HTN, DM, anxiety, depression, obesity.   TEST/EVENTS:  08/11/2023 HST: AHI 30.8/h, SpO2 low 61%  08/05/2023: OV with Jasun Gasparini NP for sleep consult. She had a sleep study many years ago.  She was told that she needed a CPAP but was unable to afford this at this time.  She has since had a job change and got new insurance.  She has issues with loud snoring and restless sleep.  She does have some daytime tiredness but is more so after she works her 12-hour shift.  She has frequent daytime headaches.  No issues with drowsy driving, sleep parasomnia/paralysis.  No history of narcolepsy or symptoms of cataplexy. She goes to bed around 9:30 PM.  Falls asleep within 30 minutes.  Wakes once a night.  Gets up around 5:15 AM.  Does not operate any heavy machinery in her job Animal nutritionist.  Weight has fluctuated 10 pounds over the last 2 years.  Previous sleep study records not available.  Does not use any supplemental oxygen. She has a history of high blood pressure, diabetes well-controlled on current medication regimen.  No history of stroke. She is a former smoker.  Quit in 2006.  Seldomly drinks alcohol.  She usually drinks 3 cups of coffee a day.  Lives by herself.  Works as a Advertising copywriter.  Has 3 adult children.  Family history of cancer. [place] 4  09/29/2023: OV with Nannette Zill NP for follow up. She had a home sleep study which revealed severe sleep apnea. She feels unchanged compared to her last visit. She wants to start CPAP. She denies any drowsy driving, sleep parasomnias/paralysis.   12/16/2023: OV with Louiza Moor NP for follow up after starting on CPAP therapy. She  has been having difficulties with the mask, which causes her to take it off throughout the night. She does put it on at the beginning of the nights most nights. She has noticed that when she's slept all night with it, she wakes up feeling rejuvenated. She sleeps more restfully with it. She just wants a different mask than the full face she has. No sleep parasomnias/paralysis or drowsy driving.   7/82/9562: Today - follow up Patient presents today for follow up. She's had some difficulties adjusting to CPAP. Tried a few different mask options and pressure changes. She tried a nasal mask at our last visit and ended up going back to a hybrid FFM. At some point, the pressures were increased back to 5-15 cmH2O. She feels like this is too high and sometimes causes leaks into her eyes. Otherwise, she has been using it more consistently. She does sleep better with her CPAP. Feels better rested. No sleep parasomnias/paralysis, drowsy driving, morning headaches.   12/21/2023-01/19/2024 CPAP 5-15 cmH2O 23/30 days; 37% >4 hr; average use 3 hr 7 min Pressure 95th 9.6 Leaks   Allergies  Allergen Reactions   No Known Allergies     Immunization History  Administered Date(s) Administered   Influenza-Unspecified 07/19/2016, 06/29/2021, 06/20/2022, 07/04/2023   PFIZER Comirnaty(Gray Top)Covid-19 Tri-Sucrose Vaccine 09/24/2019, 10/15/2019   Pfizer Covid-19 Vaccine Bivalent Booster 5y-11y 07/11/2020  Tdap 09/24/2019, 07/05/2022    Past Medical History:  Diagnosis Date   Diabetes mellitus without complication (HCC)    Dx 2006   Hyperlipidemia    Hypertension    Sleep apnea    Vertigo    2002    Tobacco History: Social History   Tobacco Use  Smoking Status Former   Current packs/day: 0.00   Types: Cigarettes   Quit date: 09/27/2004   Years since quitting: 19.3  Smokeless Tobacco Never   Counseling given: Not Answered   Outpatient Medications Prior to Visit  Medication Sig Dispense Refill    albuterol  (VENTOLIN  HFA) 108 (90 Base) MCG/ACT inhaler Inhale 2 puffs into the lungs every 4 hours as needed for wheezing. 6.7 g 0   aspirin EC 81 MG tablet Take 81 mg by mouth daily.     Black Cohosh 40 MG CAPS Take 40 mg by mouth 2 (two) times daily.     blood glucose meter kit and supplies Dispense based on patient and insurance preference. Use up to four times daily as directed. (FOR ICD-10 E10.9, E11.9). 1 each 0   Blood Pressure Monitoring (OMRON 3 SERIES BP MONITOR) DEVI use to check blood pressure 1 each 0   Crisaborole  (EUCRISA ) 2 % OINT Apply 1 Application topically to affected skin on breast every morning. 60 g 6   fluticasone  (FLONASE ) 50 MCG/ACT nasal spray Place 2 sprays into both nostrils daily. 16 g 6   furosemide  (LASIX ) 20 MG tablet Take 1 tablet (20 mg total) by mouth every other day. 30 tablet 3   gabapentin  (NEURONTIN ) 100 MG capsule Take 1 capsule (100 mg total) by mouth 3 (three) times daily. 90 capsule 3   glipiZIDE  (GLUCOTROL ) 10 MG tablet TAKE 1 TABLET BY MOUTH TWICE DAILY 180 tablet 5   glucose blood test strip Test as directed up to 4 times a day 100 each 0   hydrOXYzine  (VISTARIL ) 25 MG capsule Take 1 capsule (25 mg total) by mouth every 8 (eight) hours as needed. 30 capsule 0   insulin  degludec (TRESIBA  FLEXTOUCH) 100 UNIT/ML FlexTouch Pen Inject 40 Units into the skin daily. 12 mL 0   Iron , Ferrous Sulfate , 325 (65 Fe) MG TABS Take 325 mg by mouth daily. 30 tablet 0   ketoconazole  (NIZORAL ) 2 % cream Apply 1 Application topically 2 (two) times daily under the breast. 60 g 5   Lancets (FREESTYLE) lancets Test up to 4 times a day as directed 100 each 0   latanoprost  (XALATAN ) 0.005 % ophthalmic solution instill 1 drop into affected eye(s) once daily in the evening 5 mL 3   latanoprost  (XALATAN ) 0.005 % ophthalmic solution Place 1 drop into both eyes daily in the evening. 2.5 mL 0   latanoprost  (XALATAN ) 0.005 % ophthalmic solution Place 1 drop into affected eye(s) every  evening. 5 mL 1   magic mouthwash (nystatin , lidocaine , diphenhydrAMINE , alum & mag hydroxide) suspension Swish and spit 5 mLs 3 (three) times daily as needed for mouth pain. 180 mL 0   meclizine (ANTIVERT) 25 MG tablet Take 25 mg by mouth 3 (three) times daily as needed for dizziness.      meloxicam  (MOBIC ) 15 MG tablet Take 1 tablet (15 mg total) by mouth daily with meal. 90 tablet 2   metFORMIN  (GLUCOPHAGE ) 1000 MG tablet Take 1 tablet (1,000 mg total) by mouth 2 (two) times daily. 180 tablet 2   mometasone  (ELOCON ) 0.1 % cream Apply 1 Application topically 2 (two) times  daily under breast 3 times a week. 45 g 5   Multiple Vitamins-Minerals (CENTRAL-VITE PO) Take 1 tablet by mouth daily.     rosuvastatin  (CRESTOR ) 10 MG tablet Take 1 tablet (10 mg total) by mouth daily. 90 tablet 3   tirzepatide  (MOUNJARO ) 10 MG/0.5ML Pen Inject 10 mg into the skin once a week. 6 mL 2   tirzepatide  (MOUNJARO ) 12.5 MG/0.5ML Pen Inject 12.5 mg into the skin once a week. 6 mL 4   valsartan -hydrochlorothiazide  (DIOVAN -HCT) 80-12.5 MG tablet Take 1 tablet by mouth daily. 90 tablet 3   insulin  glargine-yfgn (SEMGLEE , YFGN,) 100 UNIT/ML Pen Inject 40 Units into the skin daily. (Patient not taking: Reported on 12/16/2023) 15 mL 5   ondansetron  (ZOFRAN -ODT) 4 MG disintegrating tablet Take 1 tablet (4 mg total) by mouth every 8 (eight) hours as needed for nausea or vomiting. (Patient not taking: Reported on 12/16/2023) 20 tablet 0   No facility-administered medications prior to visit.     Review of Systems:   Constitutional: No weight loss or gain, night sweats, fevers, chills, or lassitude. +occasional fatigue (improved) HEENT: No difficulty swallowing, tooth/dental problems, or sore throat. No sneezing, itching, ear ache, nasal congestion, or post nasal drip. +frequent headaches  CV:  No chest pain, orthopnea, PND, swelling in lower extremities, anasarca, dizziness, palpitations, syncope Resp: +snoring (without  CPAP); baseline shortness of breath with exertion. No excess mucus or change in color of mucus. No productive or non-productive. No hemoptysis. No wheezing.  No chest wall deformity GI:  No heartburn, indigestion GU: No dysuria, change in color of urine, urgency or frequency.   Skin: No rash, lesions, ulcerations MSK:  No joint pain or swelling.  Neuro: No dizziness or lightheadedness.  Psych: +stable depression, anxiety. No SI/HI. Mood stable. +sleep disturbance     Physical Exam:  BP 138/80   Pulse 98   Ht 4\' 10"  (1.473 m)   Wt 206 lb (93.4 kg)   SpO2 95%   BMI 43.05 kg/m   GEN: Pleasant, interactive, well-appearing; obese; in no acute distress HEENT:  Normocephalic and atraumatic. PERRLA. Sclera white. Nasal turbinates pink, moist and patent bilaterally. No rhinorrhea present. Oropharynx pink and moist, without exudate or edema. No lesions, ulcerations, or postnasal drip. Mallampati III NECK:  Supple w/ fair ROM. No JVD present. Normal carotid impulses w/o bruits. Thyroid  symmetrical with no goiter or nodules palpated. No lymphadenopathy.   CV: RRR, no m/r/g, no peripheral edema. Pulses intact, +2 bilaterally. No cyanosis, pallor or clubbing. PULMONARY:  Unlabored, regular breathing. Clear bilaterally A&P w/o wheezes/rales/rhonchi. No accessory muscle use.  GI: BS present and normoactive. Soft, non-tender to palpation. No organomegaly or masses detected.  MSK: No erythema, warmth or tenderness. Cap refil <2 sec all extrem. No deformities or joint swelling noted.  Neuro: A/Ox3. No focal deficits noted.   Skin: Warm, no lesions or rashe Psych: Normal affect and behavior. Judgement and thought content appropriate.     Lab Results:  CBC    Component Value Date/Time   WBC 10.3 12/08/2023 1545   RBC 3.76 (L) 12/08/2023 1545   HGB 11.0 (L) 12/08/2023 1545   HGB 12.2 01/07/2023 1602   HCT 33.6 (L) 12/08/2023 1545   HCT 36.8 01/07/2023 1602   PLT 352.0 12/08/2023 1545   PLT 317  01/07/2023 1602   MCV 89.2 12/08/2023 1545   MCV 85 01/07/2023 1602   MCH 29.0 10/14/2023 0711   MCHC 32.8 12/08/2023 1545   RDW 15.0 12/08/2023 1545  RDW 14.0 01/07/2023 1602   LYMPHSABS 2.8 12/08/2023 1545   LYMPHSABS 4.3 (H) 01/07/2023 1602   MONOABS 0.6 12/08/2023 1545   EOSABS 0.4 12/08/2023 1545   EOSABS 0.0 01/07/2023 1602   BASOSABS 0.1 12/08/2023 1545   BASOSABS 0.0 01/07/2023 1602    BMET    Component Value Date/Time   NA 139 10/18/2023 0827   NA 142 01/07/2023 1602   K 3.9 10/18/2023 0827   CL 104 10/18/2023 0827   CO2 25 10/18/2023 0827   GLUCOSE 138 (H) 10/18/2023 0827   BUN 28 (H) 10/18/2023 0827   BUN 22 01/07/2023 1602   CREATININE 0.97 10/18/2023 0827   CREATININE 0.62 08/04/2021 1043   CALCIUM  8.8 10/18/2023 0827   GFRNONAA >60 10/14/2023 0711   GFRNONAA 99 02/21/2020 0952   GFRAA 115 02/21/2020 0952    BNP No results found for: "BNP"   Imaging:  No results found.  Administration History     None           No data to display          No results found for: "NITRICOXIDE"      Assessment & Plan:   Severe obstructive sleep apnea Severe OSA on CPAP. Excellent control and improved compliance. She is receiving benefit from use. Tolerating current mask well. She does feel like her pressures are too high. Will decrease for comfort to 5-10 cmH2O. Reviewed risks of untreated severe OSA. Encouraged her to continue increasing usage. Safe driving practices reviewed. Understands proper care/use of CPAP.   Patient Instructions  Continue to use CPAP every night Change equipment as directed. Wash your tubing with warm soap and water daily, hang to dry. Wash humidifier portion weekly. Use bottled, distilled water and change daily Be aware of reduced alertness and do not drive or operate heavy machinery if experiencing this or drowsiness.  Exercise encouraged, as tolerated. Healthy weight management discussed.  Avoid or decrease alcohol  consumption and medications that make you more sleepy, if possible. Notify if persistent daytime sleepiness occurs even with consistent use of PAP therapy.  Adjust settings to 5-10 cmH2O Let me know if this isn't working well for you  We discussed how untreated sleep apnea puts an individual at risk for cardiac arrhthymias, pulm HTN, DM, stroke and increases their risk for daytime accidents.  Ask the medical supply company or look online for longer tubing option Keep up the good work using it more often. Aim for nightly with 6 hours of use   Follow up in 6 weeks to recheck compliance with Katie Kadejah Sandiford,NP, or sooner, if needed.   Class 3 severe obesity with body mass index (BMI) of 40.0 to 44.9 in adult (HCC) BMI 43. Healthy weight loss encouraged   Advised if symptoms do not improve or worsen, to please contact office for sooner follow up or seek emergency care.   I spent 35 minutes of dedicated to the care of this patient on the date of this encounter to include pre-visit review of records, face-to-face time with the patient discussing conditions above, post visit ordering of testing, clinical documentation with the electronic health record, making appropriate referrals as documented, and communicating necessary findings to members of the patients care team.  Roetta Clarke, NP 01/20/2024  Pt aware and understands NP's role.

## 2024-01-20 NOTE — Assessment & Plan Note (Signed)
 Severe OSA on CPAP. Excellent control and improved compliance. She is receiving benefit from use. Tolerating current mask well. She does feel like her pressures are too high. Will decrease for comfort to 5-10 cmH2O. Reviewed risks of untreated severe OSA. Encouraged her to continue increasing usage. Safe driving practices reviewed. Understands proper care/use of CPAP.   Patient Instructions  Continue to use CPAP every night Change equipment as directed. Wash your tubing with warm soap and water daily, hang to dry. Wash humidifier portion weekly. Use bottled, distilled water and change daily Be aware of reduced alertness and do not drive or operate heavy machinery if experiencing this or drowsiness.  Exercise encouraged, as tolerated. Healthy weight management discussed.  Avoid or decrease alcohol consumption and medications that make you more sleepy, if possible. Notify if persistent daytime sleepiness occurs even with consistent use of PAP therapy.  Adjust settings to 5-10 cmH2O Let me know if this isn't working well for you  We discussed how untreated sleep apnea puts an individual at risk for cardiac arrhthymias, pulm HTN, DM, stroke and increases their risk for daytime accidents.  Ask the medical supply company or look online for longer tubing option Keep up the good work using it more often. Aim for nightly with 6 hours of use   Follow up in 6 weeks to recheck compliance with Tamara Nyjae Hodge,NP, or sooner, if needed.

## 2024-01-20 NOTE — Patient Instructions (Addendum)
 Continue to use CPAP every night Change equipment as directed. Wash your tubing with warm soap and water daily, hang to dry. Wash humidifier portion weekly. Use bottled, distilled water and change daily Be aware of reduced alertness and do not drive or operate heavy machinery if experiencing this or drowsiness.  Exercise encouraged, as tolerated. Healthy weight management discussed.  Avoid or decrease alcohol consumption and medications that make you more sleepy, if possible. Notify if persistent daytime sleepiness occurs even with consistent use of PAP therapy.  Adjust settings to 5-10 cmH2O Let me know if this isn't working well for you  We discussed how untreated sleep apnea puts an individual at risk for cardiac arrhthymias, pulm HTN, DM, stroke and increases their risk for daytime accidents.  Ask the medical supply company or look online for longer tubing option Keep up the good work using it more often. Aim for nightly with 6 hours of use   Follow up in 6 weeks to recheck compliance with [name],NP, or sooner, if needed.

## 2024-01-25 MED FILL — Furosemide Tab 20 MG: ORAL | 60 days supply | Qty: 30 | Fill #1 | Status: AC

## 2024-01-27 ENCOUNTER — Ambulatory Visit
Admission: RE | Admit: 2024-01-27 | Discharge: 2024-01-27 | Disposition: A | Discharge status: Home / Self Care | Source: Ambulatory Visit | Attending: Nurse Practitioner | Admitting: Nurse Practitioner

## 2024-01-27 DIAGNOSIS — Z1231 Encounter for screening mammogram for malignant neoplasm of breast: Secondary | ICD-10-CM | POA: Diagnosis not present

## 2024-02-07 ENCOUNTER — Other Ambulatory Visit: Payer: Self-pay

## 2024-02-07 ENCOUNTER — Other Ambulatory Visit: Payer: Self-pay | Admitting: Nurse Practitioner

## 2024-02-07 MED ORDER — TRESIBA FLEXTOUCH 100 UNIT/ML ~~LOC~~ SOPN
40.0000 [IU] | PEN_INJECTOR | Freq: Every day | SUBCUTANEOUS | 0 refills | Status: DC
  Filled 2024-02-07: qty 12, 30d supply, fill #0

## 2024-02-09 ENCOUNTER — Other Ambulatory Visit: Payer: Self-pay

## 2024-02-09 ENCOUNTER — Other Ambulatory Visit: Payer: Self-pay | Admitting: Nurse Practitioner

## 2024-02-09 MED FILL — Furosemide Tab 20 MG: ORAL | 60 days supply | Qty: 30 | Fill #2 | Status: CN

## 2024-02-10 ENCOUNTER — Other Ambulatory Visit: Payer: Self-pay

## 2024-02-10 MED FILL — Ferrous Sulfate Tab 325 MG (65 MG Elemental Fe): ORAL | 30 days supply | Qty: 30 | Fill #0 | Status: AC

## 2024-02-13 ENCOUNTER — Other Ambulatory Visit: Payer: Self-pay

## 2024-02-16 DIAGNOSIS — G4733 Obstructive sleep apnea (adult) (pediatric): Secondary | ICD-10-CM | POA: Diagnosis not present

## 2024-02-21 ENCOUNTER — Other Ambulatory Visit: Payer: Self-pay | Admitting: Nurse Practitioner

## 2024-02-21 ENCOUNTER — Other Ambulatory Visit: Payer: Self-pay

## 2024-02-21 MED ORDER — GLIPIZIDE 10 MG PO TABS
10.0000 mg | ORAL_TABLET | Freq: Two times a day (BID) | ORAL | 5 refills | Status: DC
  Filled 2024-02-21: qty 180, 90d supply, fill #0

## 2024-03-10 ENCOUNTER — Other Ambulatory Visit: Payer: Self-pay | Admitting: Nurse Practitioner

## 2024-03-10 ENCOUNTER — Other Ambulatory Visit: Payer: Self-pay

## 2024-03-12 ENCOUNTER — Other Ambulatory Visit: Payer: Self-pay

## 2024-03-12 MED FILL — Insulin Degludec Soln Pen-Injector 100 Unit/ML: SUBCUTANEOUS | 30 days supply | Qty: 12 | Fill #0 | Status: AC

## 2024-03-12 MED FILL — Insulin Degludec Soln Pen-Injector 100 Unit/ML: SUBCUTANEOUS | 38 days supply | Qty: 12 | Fill #0 | Status: CN

## 2024-03-13 ENCOUNTER — Other Ambulatory Visit: Payer: Self-pay

## 2024-03-16 ENCOUNTER — Ambulatory Visit: Admitting: Nurse Practitioner

## 2024-03-16 ENCOUNTER — Encounter: Payer: Self-pay | Admitting: Nurse Practitioner

## 2024-03-16 VITALS — BP 126/80 | HR 96 | Temp 98.2°F | Ht <= 58 in | Wt 201.8 lb

## 2024-03-16 DIAGNOSIS — G4733 Obstructive sleep apnea (adult) (pediatric): Secondary | ICD-10-CM | POA: Diagnosis not present

## 2024-03-16 DIAGNOSIS — E66813 Obesity, class 3: Secondary | ICD-10-CM | POA: Diagnosis not present

## 2024-03-16 DIAGNOSIS — Z6841 Body Mass Index (BMI) 40.0 and over, adult: Secondary | ICD-10-CM | POA: Diagnosis not present

## 2024-03-16 DIAGNOSIS — Z87891 Personal history of nicotine dependence: Secondary | ICD-10-CM

## 2024-03-16 NOTE — Patient Instructions (Addendum)
 Continue to use CPAP every night Change equipment as directed. Wash your tubing with warm soap and water daily, hang to dry. Wash humidifier portion weekly. Use bottled, distilled water and change daily Be aware of reduced alertness and do not drive or operate heavy machinery if experiencing this or drowsiness.  Exercise encouraged, as tolerated. Healthy weight management discussed.  Avoid or decrease alcohol consumption and medications that make you more sleepy, if possible. Notify if persistent daytime sleepiness occurs even with consistent use of PAP therapy.   Follow up in 6 months to recheck compliance with Tamara Yoshiko Keleher,NP, or sooner, if needed.

## 2024-03-16 NOTE — Assessment & Plan Note (Signed)
 Severe OSA on CPAP. Improved compliance. Receives benefit from use. Encouraged to continue utilizing nightly. Aware of proper care/use of device. Understands risks of untreated OSA. Safe driving practices reviewed. Healthy weight loss encouraged.   Patient Instructions  Continue to use CPAP every night Change equipment as directed. Wash your tubing with warm soap and water daily, hang to dry. Wash humidifier portion weekly. Use bottled, distilled water and change daily Be aware of reduced alertness and do not drive or operate heavy machinery if experiencing this or drowsiness.  Exercise encouraged, as tolerated. Healthy weight management discussed.  Avoid or decrease alcohol consumption and medications that make you more sleepy, if possible. Notify if persistent daytime sleepiness occurs even with consistent use of PAP therapy.   Follow up in 6 months to recheck compliance with Katie Rayhana Slider,NP, or sooner, if needed.

## 2024-03-16 NOTE — Progress Notes (Signed)
 @Patient  ID: Tamara Wells, female    DOB: July 15, 1961, 63 y.o.   MRN: [id number]  Chief Complaint  Patient presents with   Follow-up    CPAP Check  Everything is doing ok, feeling better than she was previously and is liking the new mask set-up and pressure still seems to be comfortable. No other complaints.    Referring provider: Tona Francis, NP  HPI: 63 year old female, former smoker followed for severe sleep apnea. Past medical history HTN, DM, anxiety, depression, obesity.   TEST/EVENTS:  08/11/2023 HST: AHI 30.8/h, SpO2 low 61%  08/05/2023: OV with Jamilah Jean NP for sleep consult. She had a sleep study many years ago.  She was told that she needed a CPAP but was unable to afford this at this time.  She has since had a job change and got new insurance.  She has issues with loud snoring and restless sleep.  She does have some daytime tiredness but is more so after she works her 12-hour shift.  She has frequent daytime headaches.  No issues with drowsy driving, sleep parasomnia/paralysis.  No history of narcolepsy or symptoms of cataplexy. She goes to bed around 9:30 PM.  Falls asleep within 30 minutes.  Wakes once a night.  Gets up around 5:15 AM.  Does not operate any heavy machinery in her job Animal nutritionist.  Weight has fluctuated 10 pounds over the last 2 years.  Previous sleep study records not available.  Does not use any supplemental oxygen. She has a history of high blood pressure, diabetes well-controlled on current medication regimen.  No history of stroke. She is a former smoker.  Quit in 2006.  Seldomly drinks alcohol.  She usually drinks 3 cups of coffee a day.  Lives by herself.  Works as a Advertising copywriter.  Has 3 adult children.  Family history of cancer. [place] 4  09/29/2023: OV with Omni Dunsworth NP for follow up. She had a home sleep study which revealed severe sleep apnea. She feels unchanged compared to her last visit. She wants to start CPAP. She denies any drowsy driving, sleep  parasomnias/paralysis.   12/16/2023: OV with Armonee Bojanowski NP for follow up after starting on CPAP therapy. She has been having difficulties with the mask, which causes her to take it off throughout the night. She does put it on at the beginning of the nights most nights. She has noticed that when she's slept all night with it, she wakes up feeling rejuvenated. She sleeps more restfully with it. She just wants a different mask than the full face she has. No sleep parasomnias/paralysis or drowsy driving.   7/84/6962: Ov with Jermiah Soderman NP for follow up. She's had some difficulties adjusting to CPAP. Tried a few different mask options and pressure changes. She tried a nasal mask at our last visit and ended up going back to a hybrid FFM. At some point, the pressures were increased back to 5-15 cmH2O. She feels like this is too high and sometimes causes leaks into her eyes. Otherwise, she has been using it more consistently. She does sleep better with her CPAP. Feels better rested. No sleep parasomnias/paralysis, drowsy driving, morning headaches.  12/21/2023-01/19/2024 CPAP 5-15 cmH2O 23/30 days; 37% >4 hr; average use 3 hr 7 min Pressure 95th 9.6 Leaks   03/16/2024: Today - follow up Patient presents today for follow up. She has been doing much better with CPAP. She's been putting it on every night. Still has some nights she takes it off but she's  been able to use it more consistently. Feels better when she uses it all night. Energy levels are good the next day. Likes the new mask and pressure settings. No issues with drowsy driving.  1/61/0960-4/54/0981 CPAP 5-10 cmH2O 30/30 days; average use 4 hr 39 min Pressure 95th 9.7 AHI 1.3  Allergies  Allergen Reactions   No Known Allergies     Immunization History  Administered Date(s) Administered   Influenza-Unspecified 07/19/2016, 06/29/2021, 06/20/2022, 07/04/2023   PFIZER Comirnaty(Gray Top)Covid-19 Tri-Sucrose Vaccine 09/24/2019, 10/15/2019   Pfizer Covid-19  Vaccine Bivalent Booster 5y-11y 07/11/2020   Tdap 09/24/2019, 07/05/2022    Past Medical History:  Diagnosis Date   Diabetes mellitus without complication (HCC)    Dx 2006   Hyperlipidemia    Hypertension    Sleep apnea    Vertigo    2002    Tobacco History: Social History   Tobacco Use  Smoking Status Former   Current packs/day: 0.00   Types: Cigarettes   Quit date: 09/27/2004   Years since quitting: 19.4  Smokeless Tobacco Never   Counseling given: Not Answered   Outpatient Medications Prior to Visit  Medication Sig Dispense Refill   albuterol  (VENTOLIN  HFA) 108 (90 Base) MCG/ACT inhaler Inhale 2 puffs into the lungs every 4 hours as needed for wheezing. 6.7 g 0   aspirin EC 81 MG tablet Take 81 mg by mouth daily.     Black Cohosh 40 MG CAPS Take 40 mg by mouth 2 (two) times daily.     blood glucose meter kit and supplies Dispense based on patient and insurance preference. Use up to four times daily as directed. (FOR ICD-10 E10.9, E11.9). 1 each 0   Blood Pressure Monitoring (OMRON 3 SERIES BP MONITOR) DEVI use to check blood pressure 1 each 0   Crisaborole  (EUCRISA ) 2 % OINT Apply 1 Application topically to affected skin on breast every morning. 60 g 6   ferrous sulfate  (FEROSUL) 325 (65 FE) MG tablet Take 1 tablet (325 mg total) by mouth daily. 30 tablet 0   fluticasone  (FLONASE ) 50 MCG/ACT nasal spray Place 2 sprays into both nostrils daily. 16 g 6   furosemide  (LASIX ) 20 MG tablet Take 1 tablet (20 mg total) by mouth every other day. 30 tablet 3   gabapentin  (NEURONTIN ) 100 MG capsule Take 1 capsule (100 mg total) by mouth 3 (three) times daily. 90 capsule 3   glipiZIDE  (GLUCOTROL ) 10 MG tablet Take 1 tablet (10 mg total) by mouth 2 (two) times daily. 180 tablet 5   glucose blood test strip Test as directed up to 4 times a day 100 each 0   hydrOXYzine  (VISTARIL ) 25 MG capsule Take 1 capsule (25 mg total) by mouth every 8 (eight) hours as needed. 30 capsule 0   insulin   degludec (TRESIBA  FLEXTOUCH) 100 UNIT/ML FlexTouch Pen Inject 40 Units into the skin daily. 12 mL 0   ketoconazole  (NIZORAL ) 2 % cream Apply 1 Application topically 2 (two) times daily under the breast. 60 g 5   Lancets (FREESTYLE) lancets Test up to 4 times a day as directed 100 each 0   latanoprost  (XALATAN ) 0.005 % ophthalmic solution instill 1 drop into affected eye(s) once daily in the evening 5 mL 3   latanoprost  (XALATAN ) 0.005 % ophthalmic solution Place 1 drop into both eyes daily in the evening. 2.5 mL 0   latanoprost  (XALATAN ) 0.005 % ophthalmic solution Place 1 drop into affected eye(s) every evening. 5 mL 1  magic mouthwash (nystatin , lidocaine , diphenhydrAMINE , alum & mag hydroxide) suspension Swish and spit 5 mLs 3 (three) times daily as needed for mouth pain. 180 mL 0   meclizine (ANTIVERT) 25 MG tablet Take 25 mg by mouth 3 (three) times daily as needed for dizziness.      meloxicam  (MOBIC ) 15 MG tablet Take 1 tablet (15 mg total) by mouth daily with meal. 90 tablet 2   metFORMIN  (GLUCOPHAGE ) 1000 MG tablet Take 1 tablet (1,000 mg total) by mouth 2 (two) times daily. 180 tablet 2   mometasone  (ELOCON ) 0.1 % cream Apply 1 Application topically 2 (two) times daily under breast 3 times a week. 45 g 5   Multiple Vitamins-Minerals (CENTRAL-VITE PO) Take 1 tablet by mouth daily.     rosuvastatin  (CRESTOR ) 10 MG tablet Take 1 tablet (10 mg total) by mouth daily. 90 tablet 3   tirzepatide  (MOUNJARO ) 10 MG/0.5ML Pen Inject 10 mg into the skin once a week. 6 mL 2   tirzepatide  (MOUNJARO ) 12.5 MG/0.5ML Pen Inject 12.5 mg into the skin once a week. 6 mL 4   valsartan -hydrochlorothiazide  (DIOVAN -HCT) 80-12.5 MG tablet Take 1 tablet by mouth daily. 90 tablet 3   No facility-administered medications prior to visit.     Review of Systems:   Constitutional: No weight loss or gain, night sweats, fevers, chills, or lassitude. +occasional fatigue (improved) HEENT: No difficulty swallowing,  tooth/dental problems, or sore throat. No sneezing, itching, ear ache, nasal congestion, or post nasal drip. +headaches (improved) CV:  No chest pain, orthopnea, PND, swelling in lower extremities, anasarca, dizziness, palpitations, syncope Resp: +snoring (without CPAP); baseline shortness of breath with exertion. No cough GI:  No heartburn, indigestion GU: No dysuria, change in color of urine, urgency or frequency.   Skin: No rash, lesions, ulcerations MSK:  No joint pain or swelling.  Neuro: No dizziness or lightheadedness.  Psych: +stable depression, anxiety. No SI/HI. Mood stable.     Physical Exam:  BP 126/80 (BP Location: Right Arm, Patient Position: Sitting, Cuff Size: Large)   Pulse 96   Temp 98.2 F (36.8 C) (Oral)   Ht 4' 10 (1.473 m)   Wt 201 lb 12.8 oz (91.5 kg)   SpO2 95%   BMI 42.18 kg/m   GEN: Pleasant, interactive, well-appearing; obese; in no acute distress HEENT:  Normocephalic and atraumatic. PERRLA. Sclera white. Nasal turbinates pink, moist and patent bilaterally. No rhinorrhea present. Oropharynx pink and moist, without exudate or edema. No lesions, ulcerations, or postnasal drip. Mallampati III NECK:  Supple w/ fair ROM.  No lymphadenopathy.   CV: RRR, no m/r/g, no peripheral edema. Pulses intact, +2 bilaterally. No cyanosis, pallor or clubbing. PULMONARY:  Unlabored, regular breathing. Clear bilaterally A&P w/o wheezes/rales/rhonchi. No accessory muscle use.  GI: BS present and normoactive. Soft, non-tender to palpation. No organomegaly or masses detected.  MSK: No erythema, warmth or tenderness. Cap refil <2 sec all extrem. No deformities or joint swelling noted.  Neuro: A/Ox3. No focal deficits noted.   Skin: Warm, no lesions or rashe Psych: Normal affect and behavior. Judgement and thought content appropriate.     Lab Results:  CBC    Component Value Date/Time   WBC 10.3 12/08/2023 1545   RBC 3.76 (L) 12/08/2023 1545   HGB 11.0 (L) 12/08/2023  1545   HGB 12.2 01/07/2023 1602   HCT 33.6 (L) 12/08/2023 1545   HCT 36.8 01/07/2023 1602   PLT 352.0 12/08/2023 1545   PLT 317 01/07/2023 1602  MCV 89.2 12/08/2023 1545   MCV 85 01/07/2023 1602   MCH 29.0 10/14/2023 0711   MCHC 32.8 12/08/2023 1545   RDW 15.0 12/08/2023 1545   RDW 14.0 01/07/2023 1602   LYMPHSABS 2.8 12/08/2023 1545   LYMPHSABS 4.3 (H) 01/07/2023 1602   MONOABS 0.6 12/08/2023 1545   EOSABS 0.4 12/08/2023 1545   EOSABS 0.0 01/07/2023 1602   BASOSABS 0.1 12/08/2023 1545   BASOSABS 0.0 01/07/2023 1602    BMET    Component Value Date/Time   NA 139 10/18/2023 0827   NA 142 01/07/2023 1602   K 3.9 10/18/2023 0827   CL 104 10/18/2023 0827   CO2 25 10/18/2023 0827   GLUCOSE 138 (H) 10/18/2023 0827   BUN 28 (H) 10/18/2023 0827   BUN 22 01/07/2023 1602   CREATININE 0.97 10/18/2023 0827   CREATININE 0.62 08/04/2021 1043   CALCIUM  8.8 10/18/2023 0827   GFRNONAA >60 10/14/2023 0711   GFRNONAA 99 02/21/2020 0952   GFRAA 115 02/21/2020 0952    BNP No results found for: BNP   Imaging:  No results found.  Administration History     None           No data to display          No results found for: NITRICOXIDE      Assessment & Plan:   Severe obstructive sleep apnea Severe OSA on CPAP. Improved compliance. Receives benefit from use. Encouraged to continue utilizing nightly. Aware of proper care/use of device. Understands risks of untreated OSA. Safe driving practices reviewed. Healthy weight loss encouraged.   Patient Instructions  Continue to use CPAP every night Change equipment as directed. Wash your tubing with warm soap and water daily, hang to dry. Wash humidifier portion weekly. Use bottled, distilled water and change daily Be aware of reduced alertness and do not drive or operate heavy machinery if experiencing this or drowsiness.  Exercise encouraged, as tolerated. Healthy weight management discussed.  Avoid or decrease  alcohol consumption and medications that make you more sleepy, if possible. Notify if persistent daytime sleepiness occurs even with consistent use of PAP therapy.   Follow up in 6 months to recheck compliance with Katie Jomo Forand,NP, or sooner, if needed.   Class 3 severe obesity with body mass index (BMI) of 40.0 to 44.9 in adult (HCC) BMI 42. Healthy weight loss encouraged   Advised if symptoms do not improve or worsen, to please contact office for sooner follow up or seek emergency care.   I spent 25 minutes of dedicated to the care of this patient on the date of this encounter to include pre-visit review of records, face-to-face time with the patient discussing conditions above, post visit ordering of testing, clinical documentation with the electronic health record, making appropriate referrals as documented, and communicating necessary findings to members of the patients care team.  Roetta Clarke, NP 03/16/2024  Pt aware and understands NP's role.

## 2024-03-16 NOTE — Assessment & Plan Note (Signed)
 BMI 42. Healthy weight loss encouraged.

## 2024-03-18 DIAGNOSIS — G4733 Obstructive sleep apnea (adult) (pediatric): Secondary | ICD-10-CM | POA: Diagnosis not present

## 2024-03-31 MED FILL — Furosemide Tab 20 MG: ORAL | 60 days supply | Qty: 30 | Fill #2 | Status: AC

## 2024-04-02 ENCOUNTER — Other Ambulatory Visit: Payer: Self-pay

## 2024-04-05 ENCOUNTER — Other Ambulatory Visit: Payer: Self-pay

## 2024-04-05 ENCOUNTER — Ambulatory Visit: Payer: Commercial Managed Care - PPO | Admitting: Nurse Practitioner

## 2024-04-05 ENCOUNTER — Encounter: Payer: Self-pay | Admitting: Nurse Practitioner

## 2024-04-05 VITALS — BP 118/76 | HR 101 | Temp 98.0°F | Ht <= 58 in | Wt 206.0 lb

## 2024-04-05 DIAGNOSIS — I10 Essential (primary) hypertension: Secondary | ICD-10-CM | POA: Diagnosis not present

## 2024-04-05 DIAGNOSIS — B351 Tinea unguium: Secondary | ICD-10-CM | POA: Diagnosis not present

## 2024-04-05 DIAGNOSIS — E785 Hyperlipidemia, unspecified: Secondary | ICD-10-CM | POA: Diagnosis not present

## 2024-04-05 DIAGNOSIS — D649 Anemia, unspecified: Secondary | ICD-10-CM | POA: Diagnosis not present

## 2024-04-05 DIAGNOSIS — Z7984 Long term (current) use of oral hypoglycemic drugs: Secondary | ICD-10-CM

## 2024-04-05 DIAGNOSIS — G4733 Obstructive sleep apnea (adult) (pediatric): Secondary | ICD-10-CM

## 2024-04-05 DIAGNOSIS — E119 Type 2 diabetes mellitus without complications: Secondary | ICD-10-CM

## 2024-04-05 MED ORDER — MUPIROCIN 2 % EX OINT
1.0000 | TOPICAL_OINTMENT | Freq: Two times a day (BID) | CUTANEOUS | 0 refills | Status: AC
  Filled 2024-04-05: qty 22, 11d supply, fill #0

## 2024-04-05 NOTE — Patient Instructions (Signed)
-   You have a suspected nail infection in your middle toe, likely from a recent pedicure. -Soak your foot in Epsom salt water 2-3 times a day for 10-15 minutes. -If symptoms worsen, use the prescribed antibiotic cream.

## 2024-04-06 LAB — MICROALBUMIN / CREATININE URINE RATIO
Creatinine,U: 103.8 mg/dL
Microalb Creat Ratio: 10.2 mg/g (ref 0.0–30.0)
Microalb, Ur: 1.1 mg/dL (ref 0.0–1.9)

## 2024-04-06 LAB — CBC WITH DIFFERENTIAL/PLATELET
Basophils Absolute: 0.1 K/uL (ref 0.0–0.1)
Basophils Relative: 1 % (ref 0.0–3.0)
Eosinophils Absolute: 0.3 K/uL (ref 0.0–0.7)
Eosinophils Relative: 3.8 % (ref 0.0–5.0)
HCT: 33.7 % — ABNORMAL LOW (ref 36.0–46.0)
Hemoglobin: 11.1 g/dL — ABNORMAL LOW (ref 12.0–15.0)
Lymphocytes Relative: 36.6 % (ref 12.0–46.0)
Lymphs Abs: 2.8 K/uL (ref 0.7–4.0)
MCHC: 32.9 g/dL (ref 30.0–36.0)
MCV: 87.7 fl (ref 78.0–100.0)
Monocytes Absolute: 0.4 K/uL (ref 0.1–1.0)
Monocytes Relative: 5.4 % (ref 3.0–12.0)
Neutro Abs: 4.1 K/uL (ref 1.4–7.7)
Neutrophils Relative %: 53.2 % (ref 43.0–77.0)
Platelets: 329 K/uL (ref 150.0–400.0)
RBC: 3.84 Mil/uL — ABNORMAL LOW (ref 3.87–5.11)
RDW: 14.9 % (ref 11.5–15.5)
WBC: 7.6 K/uL (ref 4.0–10.5)

## 2024-04-06 LAB — LIPID PANEL
Cholesterol: 112 mg/dL (ref 0–200)
HDL: 37.6 mg/dL — ABNORMAL LOW (ref >39.00)
LDL Cholesterol: 47 mg/dL (ref 0–99)
NonHDL: 74.17
Total CHOL/HDL Ratio: 3
Triglycerides: 134 mg/dL (ref 0.0–149.0)
VLDL: 26.8 mg/dL (ref 0.0–40.0)

## 2024-04-06 LAB — COMPREHENSIVE METABOLIC PANEL WITH GFR
ALT: 14 U/L (ref 0–35)
AST: 13 U/L (ref 0–37)
Albumin: 4.1 g/dL (ref 3.5–5.2)
Alkaline Phosphatase: 73 U/L (ref 39–117)
BUN: 26 mg/dL — ABNORMAL HIGH (ref 6–23)
CO2: 30 meq/L (ref 19–32)
Calcium: 9.1 mg/dL (ref 8.4–10.5)
Chloride: 102 meq/L (ref 96–112)
Creatinine, Ser: 1.12 mg/dL (ref 0.40–1.20)
GFR: 53.01 mL/min — ABNORMAL LOW (ref >60.00)
Glucose, Bld: 147 mg/dL — ABNORMAL HIGH (ref 70–99)
Potassium: 4.2 meq/L (ref 3.5–5.1)
Sodium: 139 meq/L (ref 135–145)
Total Bilirubin: 0.2 mg/dL (ref 0.2–1.2)
Total Protein: 6.5 g/dL (ref 6.0–8.3)

## 2024-04-06 LAB — HEMOGLOBIN A1C: Hgb A1c MFr Bld: 6.9 % — ABNORMAL HIGH (ref 4.6–6.5)

## 2024-04-17 DIAGNOSIS — G4733 Obstructive sleep apnea (adult) (pediatric): Secondary | ICD-10-CM | POA: Diagnosis not present

## 2024-04-22 DIAGNOSIS — G4733 Obstructive sleep apnea (adult) (pediatric): Secondary | ICD-10-CM | POA: Diagnosis not present

## 2024-04-23 ENCOUNTER — Ambulatory Visit: Payer: Self-pay | Admitting: Nurse Practitioner

## 2024-04-23 ENCOUNTER — Other Ambulatory Visit: Payer: Self-pay

## 2024-04-23 ENCOUNTER — Other Ambulatory Visit: Payer: Self-pay | Admitting: Nurse Practitioner

## 2024-04-23 DIAGNOSIS — B351 Tinea unguium: Secondary | ICD-10-CM | POA: Insufficient documentation

## 2024-04-23 MED ORDER — TRESIBA FLEXTOUCH 100 UNIT/ML ~~LOC~~ SOPN
40.0000 [IU] | PEN_INJECTOR | Freq: Every day | SUBCUTANEOUS | 0 refills | Status: DC
  Filled 2024-04-23: qty 12, 30d supply, fill #0

## 2024-04-23 NOTE — Progress Notes (Signed)
 Established Patient Office Visit  Subjective:  Patient ID: Tamara Wells, female    DOB: 06/18/1961  Age: 63 y.o. MRN: [id number]  CC:  Chief Complaint  Patient presents with   Medical Management of Chronic Issues  Discussed the use of a AI scribe software for clinical note transcription with the patient, who gave verbal consent to proceed.   HPI  Tamara Wells is a 63 year old female  presents for routine follow-up.  Hemoglobin was low at 11 g/dL three months ago. She continues to take an iron  supplement at night to improve energy levels.  A dry spot appeared in the middle of her foot last week. Pain and soreness developed in her middle toe after a pedicure, with discoloration and difficulty moving the toe, causing pain while walking. A blister-like spot on the other foot was punctured, drained fluid, and has since dried up.  Diabetes management includes metformin , glipizide , Mounjaro , and 40 units of Tresiba  insulin  daily. Blood sugar readings are typically 112 to 119 mg/dL, with improved control.   HPI   Past Medical History:  Diagnosis Date   Diabetes mellitus without complication (HCC)    Dx 2006   Hyperlipidemia    Hypertension    Sleep apnea    Vertigo    2002    Past Surgical History:  Procedure Laterality Date   ABDOMINAL HYSTERECTOMY     2000   CARPAL TUNNEL RELEASE Left    CARPAL TUNNEL RELEASE Right 05/28/2019   Procedure: CARPAL TUNNEL RELEASE;  Surgeon: Cleotilde Barrio, MD;  Location: [facility] ORS;  Service: Orthopedics;  Laterality: Right;   COLONOSCOPY N/A 01/11/2024   Procedure: COLONOSCOPY;  Surgeon: Unk Corinn Skiff, MD;  Location: [facility] ENDOSCOPY;  Service: Gastroenterology;  Laterality: N/A;  1st CASE; DO NOT MOVE   COLONOSCOPY WITH PROPOFOL  N/A 03/21/2020   Procedure: COLONOSCOPY WITH PROPOFOL ;  Surgeon: Janalyn Keene NOVAK, MD;  Location: [facility] ENDOSCOPY;  Service: Endoscopy;  Laterality: N/A;   TONSILLECTOMY      Family History  Problem  Relation Age of Onset   Diabetes Mother    Diabetes Father    Diabetes Maternal Grandmother    Breast cancer Neg Hx     Social History   Socioeconomic History   Marital status: Married    Spouse name: Not on file   Number of children: Not on file   Years of education: Not on file   Highest education level: Not on file  Occupational History   Not on file  Tobacco Use   Smoking status: Former    Current packs/day: 0.00    Types: Cigarettes    Quit date: 09/27/2004    Years since quitting: 19.5   Smokeless tobacco: Never  Vaping Use   Vaping status: Never Used  Substance and Sexual Activity   Alcohol use: No   Drug use: No   Sexual activity: Not Currently    Birth control/protection: Surgical  Other Topics Concern   Not on file  Social History Narrative   Not on file   Social Drivers of Health   Financial Resource Strain: Not on file  Food Insecurity: Not on file  Transportation Needs: Not on file  Physical Activity: Not on file  Stress: Not on file  Social Connections: Not on file  Intimate Partner Violence: Not on file     Outpatient Medications Prior to Visit  Medication Sig Dispense Refill   albuterol  (VENTOLIN  HFA) 108 (90 Base) MCG/ACT inhaler Inhale 2 puffs  into the lungs every 4 hours as needed for wheezing. 6.7 g 0   aspirin EC 81 MG tablet Take 81 mg by mouth daily.     Black Cohosh 40 MG CAPS Take 40 mg by mouth 2 (two) times daily.     blood glucose meter kit and supplies Dispense based on patient and insurance preference. Use up to four times daily as directed. (FOR ICD-10 E10.9, E11.9). 1 each 0   Blood Pressure Monitoring (OMRON 3 SERIES BP MONITOR) DEVI use to check blood pressure 1 each 0   Crisaborole  (EUCRISA ) 2 % OINT Apply 1 Application topically to affected skin on breast every morning. 60 g 6   ferrous sulfate  (FEROSUL) 325 (65 FE) MG tablet Take 1 tablet (325 mg total) by mouth daily. 30 tablet 0   fluticasone  (FLONASE ) 50 MCG/ACT nasal  spray Place 2 sprays into both nostrils daily. 16 g 6   furosemide  (LASIX ) 20 MG tablet Take 1 tablet (20 mg total) by mouth every other day. 30 tablet 3   gabapentin  (NEURONTIN ) 100 MG capsule Take 1 capsule (100 mg total) by mouth 3 (three) times daily. 90 capsule 3   glipiZIDE  (GLUCOTROL ) 10 MG tablet Take 1 tablet (10 mg total) by mouth 2 (two) times daily. 180 tablet 5   glucose blood test strip Test as directed up to 4 times a day 100 each 0   hydrOXYzine  (VISTARIL ) 25 MG capsule Take 1 capsule (25 mg total) by mouth every 8 (eight) hours as needed. 30 capsule 0   insulin  degludec (TRESIBA  FLEXTOUCH) 100 UNIT/ML FlexTouch Pen Inject 40 Units into the skin daily. 12 mL 0   ketoconazole  (NIZORAL ) 2 % cream Apply 1 Application topically 2 (two) times daily under the breast. 60 g 5   Lancets (FREESTYLE) lancets Test up to 4 times a day as directed 100 each 0   latanoprost  (XALATAN ) 0.005 % ophthalmic solution instill 1 drop into affected eye(s) once daily in the evening 5 mL 3   latanoprost  (XALATAN ) 0.005 % ophthalmic solution Place 1 drop into both eyes daily in the evening. 2.5 mL 0   latanoprost  (XALATAN ) 0.005 % ophthalmic solution Place 1 drop into affected eye(s) every evening. 5 mL 1   magic mouthwash (nystatin , lidocaine , diphenhydrAMINE , alum & mag hydroxide) suspension Swish and spit 5 mLs 3 (three) times daily as needed for mouth pain. 180 mL 0   meclizine (ANTIVERT) 25 MG tablet Take 25 mg by mouth 3 (three) times daily as needed for dizziness.      meloxicam  (MOBIC ) 15 MG tablet Take 1 tablet (15 mg total) by mouth daily with meal. 90 tablet 2   metFORMIN  (GLUCOPHAGE ) 1000 MG tablet Take 1 tablet (1,000 mg total) by mouth 2 (two) times daily. 180 tablet 2   mometasone  (ELOCON ) 0.1 % cream Apply 1 Application topically 2 (two) times daily under breast 3 times a week. 45 g 5   Multiple Vitamins-Minerals (CENTRAL-VITE PO) Take 1 tablet by mouth daily.     rosuvastatin  (CRESTOR ) 10 MG  tablet Take 1 tablet (10 mg total) by mouth daily. 90 tablet 3   tirzepatide  (MOUNJARO ) 10 MG/0.5ML Pen Inject 10 mg into the skin once a week. 6 mL 2   tirzepatide  (MOUNJARO ) 12.5 MG/0.5ML Pen Inject 12.5 mg into the skin once a week. 6 mL 4   valsartan -hydrochlorothiazide  (DIOVAN -HCT) 80-12.5 MG tablet Take 1 tablet by mouth daily. 90 tablet 3   No facility-administered medications prior to visit.  Allergies  Allergen Reactions   No Known Allergies     ROS Review of Systems Negative unless indicated in HPI.    Objective:    Physical Exam Constitutional:      Appearance: Normal appearance.  HENT:     Mouth/Throat:     Mouth: Mucous membranes are moist.  Eyes:     Conjunctiva/sclera: Conjunctivae normal.     Pupils: Pupils are equal, round, and reactive to light.  Cardiovascular:     Rate and Rhythm: Normal rate and regular rhythm.     Pulses: Normal pulses.     Heart sounds: Normal heart sounds.  Pulmonary:     Effort: Pulmonary effort is normal.     Breath sounds: Normal breath sounds.  Abdominal:     General: Bowel sounds are normal.     Palpations: Abdomen is soft.  Musculoskeletal:     Cervical back: Normal range of motion. No tenderness.     Comments: Erythema, inflammation and tenderness to the middle toe right foot  Skin:    General: Skin is warm.     Findings: No bruising.  Neurological:     General: No focal deficit present.     Mental Status: She is alert and oriented to person, place, and time. Mental status is at baseline.  Psychiatric:        Mood and Affect: Mood normal.        Behavior: Behavior normal.        Thought Content: Thought content normal.        Judgment: Judgment normal.     BP 118/76   Pulse (!) 101   Temp 98 F (36.7 C)   Ht 4' 10 (1.473 m)   Wt 206 lb (93.4 kg)   SpO2 94%   BMI 43.05 kg/m  Wt Readings from Last 3 Encounters:  04/05/24 206 lb (93.4 kg)  03/16/24 201 lb 12.8 oz (91.5 kg)  01/20/24 206 lb (93.4 kg)      Health Maintenance  Topic Date Due   Pneumococcal Vaccine 84-32 Years old (1 of 2 - PCV) Never done   Zoster Vaccines- Shingrix (1 of 2) Never done   Cervical Cancer Screening (HPV/Pap Cotest)  Never done   OPHTHALMOLOGY EXAM  06/30/2022   COVID-19 Vaccine (7 - 2024-25 season) 05/29/2023   INFLUENZA VACCINE  04/27/2024   HEMOGLOBIN A1C  10/06/2024   FOOT EXAM  12/07/2024   Colonoscopy  01/10/2025   Diabetic kidney evaluation - eGFR measurement  04/05/2025   Diabetic kidney evaluation - Urine ACR  04/05/2025   MAMMOGRAM  01/26/2026   DTaP/Tdap/Td (4 - Td or Tdap) 07/05/2032   Hepatitis C Screening  Completed   HIV Screening  Completed   Hepatitis B Vaccines  Aged Out   HPV VACCINES  Aged Out   Meningococcal B Vaccine  Aged Out    There are no preventive care reminders to display for this patient.  Lab Results  Component Value Date   TSH 1.60 12/08/2023   Lab Results  Component Value Date   WBC 7.6 04/05/2024   HGB 11.1 (L) 04/05/2024   HCT 33.7 (L) 04/05/2024   MCV 87.7 04/05/2024   PLT 329.0 04/05/2024   Lab Results  Component Value Date   NA 139 04/05/2024   K 4.2 04/05/2024   CO2 30 04/05/2024   GLUCOSE 147 (H) 04/05/2024   BUN 26 (H) 04/05/2024   CREATININE 1.12 04/05/2024   BILITOT 0.2 04/05/2024   ALKPHOS  73 04/05/2024   AST 13 04/05/2024   ALT 14 04/05/2024   PROT 6.5 04/05/2024   ALBUMIN 4.1 04/05/2024   CALCIUM  9.1 04/05/2024   ANIONGAP 11 10/14/2023   EGFR 86 01/07/2023   GFR 53.01 (L) 04/05/2024   Lab Results  Component Value Date   CHOL 112 04/05/2024   Lab Results  Component Value Date   HDL 37.60 (L) 04/05/2024   Lab Results  Component Value Date   LDLCALC 47 04/05/2024   Lab Results  Component Value Date   TRIG 134.0 04/05/2024   Lab Results  Component Value Date   CHOLHDL 3 04/05/2024   Lab Results  Component Value Date   HGBA1C 6.9 (H) 04/05/2024      Assessment & Plan:  Anemia, unspecified type Assessment &  Plan: Chronic anemia with previous hemoglobin level of 11 g/dL. Continues iron  supplementation. - Order lab tests to evaluate current hemoglobin levels.   Orders: -     CBC with Differential/Platelet  Type 2 diabetes mellitus without complication, with long-term current use of insulin  (HCC) Assessment & Plan: Previous hemoglobin A1c 7.1 on 09/29/2023. Diabetes well-controlled with blood glucose levels between 112-119 mg/dL. On metformin , glipizide , Mounjaro , and Tresiba . - Continue current diabetes medications. - Will check A1c level.   Orders: -     Hemoglobin A1c -     Microalbumin / creatinine urine ratio  Primary hypertension Assessment & Plan: Patient BP  Vitals:   04/05/24 1457  BP: 118/76    in the office.  -Advised pt to follow a low sodium and heart healthy diet. - Continue valsartan  HCTZ 80-12.5 mg daily.   Orders: -     Comprehensive metabolic panel with GFR  Hyperlipidemia, unspecified hyperlipidemia type Assessment & Plan: On statin therapy. -Continue Crestor  10 mg for cardiovascular risk reduction  Orders: -     Lipid panel  Severe obstructive sleep apnea Assessment & Plan: Chronic stable on CPAP.    Onychomycosis Assessment & Plan: Suspected nail infection in the middle toe, possibly due to recent pedicure manipulation. - Advise soaking foot in Epsom salt water 2-3 times a day for 10-15 minutes. - Prescribe antibiotic cream for topical application if symptoms worsen.   Other orders -     Mupirocin ; Apply 1 Application topically 2 (two) times daily.  Dispense: 22 g; Refill: 0    Follow-up: Return in about 6 months (around 10/06/2024) for chronic management.   Itzae Miralles, NP

## 2024-04-23 NOTE — Assessment & Plan Note (Signed)
 Previous hemoglobin A1c 7.1 on 09/29/2023. Diabetes well-controlled with blood glucose levels between 112-119 mg/dL. On metformin , glipizide , Mounjaro , and Tresiba . - Continue current diabetes medications. - Will check A1c level.

## 2024-04-23 NOTE — Assessment & Plan Note (Signed)
 On statin therapy. -Continue Crestor  10 mg for cardiovascular risk reduction

## 2024-04-23 NOTE — Assessment & Plan Note (Signed)
 Chronic anemia with previous hemoglobin level of 11 g/dL. Continues iron  supplementation. - Order lab tests to evaluate current hemoglobin levels.

## 2024-04-23 NOTE — Assessment & Plan Note (Signed)
Chronic stable on CPAP

## 2024-04-23 NOTE — Assessment & Plan Note (Signed)
 Patient BP  Vitals:   04/05/24 1457  BP: 118/76    in the office.  -Advised pt to follow a low sodium and heart healthy diet. - Continue valsartan  HCTZ 80-12.5 mg daily.

## 2024-04-23 NOTE — Assessment & Plan Note (Signed)
 Suspected nail infection in the middle toe, possibly due to recent pedicure manipulation. - Advise soaking foot in Epsom salt water 2-3 times a day for 10-15 minutes. - Prescribe antibiotic cream for topical application if symptoms worsen.

## 2024-04-26 ENCOUNTER — Observation Stay
Admission: EM | Admit: 2024-04-26 | Discharge: 2024-04-27 | Disposition: A | Discharge status: Home / Self Care | Attending: Hospitalist | Admitting: Hospitalist

## 2024-04-26 ENCOUNTER — Encounter: Payer: Self-pay | Admitting: Internal Medicine

## 2024-04-26 ENCOUNTER — Emergency Department

## 2024-04-26 ENCOUNTER — Other Ambulatory Visit: Payer: Self-pay

## 2024-04-26 DIAGNOSIS — Z7982 Long term (current) use of aspirin: Secondary | ICD-10-CM | POA: Insufficient documentation

## 2024-04-26 DIAGNOSIS — Z6841 Body Mass Index (BMI) 40.0 and over, adult: Secondary | ICD-10-CM | POA: Insufficient documentation

## 2024-04-26 DIAGNOSIS — R4182 Altered mental status, unspecified: Secondary | ICD-10-CM | POA: Diagnosis not present

## 2024-04-26 DIAGNOSIS — I10 Essential (primary) hypertension: Secondary | ICD-10-CM | POA: Diagnosis not present

## 2024-04-26 DIAGNOSIS — F419 Anxiety disorder, unspecified: Secondary | ICD-10-CM | POA: Diagnosis not present

## 2024-04-26 DIAGNOSIS — R000 Tachycardia, unspecified: Secondary | ICD-10-CM | POA: Diagnosis not present

## 2024-04-26 DIAGNOSIS — E11649 Type 2 diabetes mellitus with hypoglycemia without coma: Secondary | ICD-10-CM | POA: Diagnosis not present

## 2024-04-26 DIAGNOSIS — E785 Hyperlipidemia, unspecified: Secondary | ICD-10-CM | POA: Diagnosis not present

## 2024-04-26 DIAGNOSIS — G4733 Obstructive sleep apnea (adult) (pediatric): Secondary | ICD-10-CM | POA: Insufficient documentation

## 2024-04-26 DIAGNOSIS — D649 Anemia, unspecified: Secondary | ICD-10-CM | POA: Insufficient documentation

## 2024-04-26 DIAGNOSIS — E119 Type 2 diabetes mellitus without complications: Secondary | ICD-10-CM

## 2024-04-26 DIAGNOSIS — E162 Hypoglycemia, unspecified: Principal | ICD-10-CM

## 2024-04-26 DIAGNOSIS — E66813 Obesity, class 3: Secondary | ICD-10-CM | POA: Insufficient documentation

## 2024-04-26 DIAGNOSIS — Z794 Long term (current) use of insulin: Secondary | ICD-10-CM | POA: Diagnosis not present

## 2024-04-26 DIAGNOSIS — F32A Depression, unspecified: Secondary | ICD-10-CM | POA: Diagnosis not present

## 2024-04-26 LAB — MRSA NEXT GEN BY PCR, NASAL: MRSA by PCR Next Gen: NOT DETECTED

## 2024-04-26 LAB — CBC WITH DIFFERENTIAL/PLATELET
Abs Immature Granulocytes: 0.04 K/uL (ref 0.00–0.07)
Basophils Absolute: 0 K/uL (ref 0.0–0.1)
Basophils Relative: 0 %
Eosinophils Absolute: 0 K/uL (ref 0.0–0.5)
Eosinophils Relative: 0 %
HCT: 38.7 % (ref 36.0–46.0)
Hemoglobin: 11.8 g/dL — ABNORMAL LOW (ref 12.0–15.0)
Immature Granulocytes: 0 %
Lymphocytes Relative: 28 %
Lymphs Abs: 2.7 K/uL (ref 0.7–4.0)
MCH: 28.4 pg (ref 26.0–34.0)
MCHC: 30.5 g/dL (ref 30.0–36.0)
MCV: 93 fL (ref 80.0–100.0)
Monocytes Absolute: 0.6 K/uL (ref 0.1–1.0)
Monocytes Relative: 6 %
Neutro Abs: 6.5 K/uL (ref 1.7–7.7)
Neutrophils Relative %: 66 %
Platelets: 324 K/uL (ref 150–400)
RBC: 4.16 MIL/uL (ref 3.87–5.11)
RDW: 14.4 % (ref 11.5–15.5)
WBC: 9.9 K/uL (ref 4.0–10.5)
nRBC: 0 % (ref 0.0–0.2)

## 2024-04-26 LAB — GLUCOSE, CAPILLARY
Glucose-Capillary: 103 mg/dL — ABNORMAL HIGH (ref 70–99)
Glucose-Capillary: 159 mg/dL — ABNORMAL HIGH (ref 70–99)
Glucose-Capillary: 163 mg/dL — ABNORMAL HIGH (ref 70–99)
Glucose-Capillary: 166 mg/dL — ABNORMAL HIGH (ref 70–99)
Glucose-Capillary: 168 mg/dL — ABNORMAL HIGH (ref 70–99)
Glucose-Capillary: 169 mg/dL — ABNORMAL HIGH (ref 70–99)
Glucose-Capillary: 62 mg/dL — ABNORMAL LOW (ref 70–99)
Glucose-Capillary: 81 mg/dL (ref 70–99)

## 2024-04-26 LAB — COMPREHENSIVE METABOLIC PANEL WITH GFR
ALT: 21 U/L (ref 0–44)
AST: 29 U/L (ref 15–41)
Albumin: 4 g/dL (ref 3.5–5.0)
Alkaline Phosphatase: 55 U/L (ref 38–126)
Anion gap: 11 (ref 5–15)
BUN: 23 mg/dL (ref 8–23)
CO2: 26 mmol/L (ref 22–32)
Calcium: 9.2 mg/dL (ref 8.9–10.3)
Chloride: 97 mmol/L — ABNORMAL LOW (ref 98–111)
Creatinine, Ser: 1.08 mg/dL — ABNORMAL HIGH (ref 0.44–1.00)
GFR, Estimated: 58 mL/min — ABNORMAL LOW (ref >60)
Glucose, Bld: 237 mg/dL — ABNORMAL HIGH (ref 70–99)
Potassium: 4.3 mmol/L (ref 3.5–5.1)
Sodium: 134 mmol/L — ABNORMAL LOW (ref 135–145)
Total Bilirubin: <0.2 mg/dL (ref 0.0–1.2)
Total Protein: 7.3 g/dL (ref 6.5–8.1)

## 2024-04-26 LAB — CBG MONITORING, ED
Glucose-Capillary: 33 mg/dL — CL (ref 70–99)
Glucose-Capillary: 108 mg/dL — ABNORMAL HIGH (ref 70–99)
Glucose-Capillary: 139 mg/dL — ABNORMAL HIGH (ref 70–99)
Glucose-Capillary: 68 mg/dL — ABNORMAL LOW (ref 70–99)
Glucose-Capillary: 87 mg/dL (ref 70–99)
Glucose-Capillary: 77 mg/dL (ref 70–99)

## 2024-04-26 MED ORDER — ORAL CARE MOUTH RINSE: 15.0000 mL | OROMUCOSAL | Status: DC | PRN

## 2024-04-26 MED ORDER — DEXTROSE 10 % IV SOLN: INTRAVENOUS | Status: DC

## 2024-04-26 MED ORDER — ONDANSETRON HCL 4 MG PO TABS: 4.0000 mg | ORAL_TABLET | Freq: Four times a day (QID) | ORAL | Status: DC | PRN

## 2024-04-26 MED ORDER — IRBESARTAN 150 MG PO TABS
75.0000 mg | ORAL_TABLET | Freq: Every day | ORAL | Status: DC
  Administered 2024-04-27: 75 mg via ORAL
  Filled 2024-04-26: qty 1

## 2024-04-26 MED ORDER — ACETAMINOPHEN 650 MG RE SUPP: 650.0000 mg | Freq: Four times a day (QID) | RECTAL | Status: DC | PRN

## 2024-04-26 MED ORDER — ROSUVASTATIN CALCIUM 10 MG PO TABS
10.0000 mg | ORAL_TABLET | Freq: Every day | ORAL | Status: DC
Start: 2024-04-26 — End: 2024-04-27
  Administered 2024-04-26: 10 mg via ORAL
  Filled 2024-04-26: qty 1

## 2024-04-26 MED ORDER — CHLORHEXIDINE GLUCONATE CLOTH 2 % EX PADS
6.0000 | MEDICATED_PAD | Freq: Every day | CUTANEOUS | Status: DC
  Administered 2024-04-26: 6 via TOPICAL

## 2024-04-26 MED ORDER — GABAPENTIN 100 MG PO CAPS
100.0000 mg | ORAL_CAPSULE | Freq: Three times a day (TID) | ORAL | Status: DC
  Administered 2024-04-26 – 2024-04-27 (×3): 100 mg via ORAL
  Filled 2024-04-26 (×3): qty 1

## 2024-04-26 MED ORDER — DEXTROSE 50 % IV SOLN
INTRAVENOUS | Status: AC
  Filled 2024-04-26: qty 50

## 2024-04-26 MED ORDER — ENOXAPARIN SODIUM 40 MG/0.4ML IJ SOSY
40.0000 mg | PREFILLED_SYRINGE | INTRAMUSCULAR | Status: DC
  Administered 2024-04-26: 40 mg via SUBCUTANEOUS
  Filled 2024-04-26: qty 0.4

## 2024-04-26 MED ORDER — HYDROCHLOROTHIAZIDE 12.5 MG PO TABS
12.5000 mg | ORAL_TABLET | Freq: Every day | ORAL | Status: DC
  Administered 2024-04-27: 12.5 mg via ORAL
  Filled 2024-04-26: qty 1

## 2024-04-26 MED ORDER — VALSARTAN-HYDROCHLOROTHIAZIDE 80-12.5 MG PO TABS: 1.0000 | ORAL_TABLET | Freq: Every day | ORAL | Status: DC

## 2024-04-26 MED ORDER — FERROUS SULFATE 325 (65 FE) MG PO TABS
325.0000 mg | ORAL_TABLET | Freq: Every day | ORAL | Status: DC
  Administered 2024-04-27: 325 mg via ORAL
  Filled 2024-04-26: qty 1

## 2024-04-26 MED ORDER — DEXTROSE 50 % IV SOLN: 25.0000 g | Freq: Once | INTRAVENOUS | Status: AC

## 2024-04-26 MED ORDER — ONDANSETRON HCL 4 MG/2ML IJ SOLN: 4.0000 mg | Freq: Four times a day (QID) | INTRAMUSCULAR | Status: DC | PRN

## 2024-04-26 MED ORDER — LATANOPROST 0.005 % OP SOLN
1.0000 [drp] | Freq: Every evening | OPHTHALMIC | Status: DC
  Administered 2024-04-26: 1 [drp] via OPHTHALMIC
  Filled 2024-04-26: qty 2.5

## 2024-04-26 MED ORDER — DEXTROSE 50 % IV SOLN: 25.0000 g | INTRAVENOUS | Status: DC | PRN

## 2024-04-26 MED ORDER — ACETAMINOPHEN 325 MG PO TABS
650.0000 mg | ORAL_TABLET | Freq: Four times a day (QID) | ORAL | Status: DC | PRN
  Administered 2024-04-26: 650 mg via ORAL
  Filled 2024-04-26: qty 2

## 2024-04-26 NOTE — Plan of Care (Signed)
  Problem: Education: Goal: Knowledge of General Education information will improve Description: Including pain rating scale, medication(s)/side effects and non-pharmacologic comfort measures 04/26/2024 1719 by Clotilda Jovita SAUNDERS, RN Outcome: Progressing 04/26/2024 1718 by Clotilda Jovita SAUNDERS, RN Outcome: Progressing   Problem: Health Behavior/Discharge Planning: Goal: Ability to manage health-related needs will improve 04/26/2024 1719 by Clotilda Jovita SAUNDERS, RN Outcome: Progressing 04/26/2024 1718 by Clotilda Jovita SAUNDERS, RN Outcome: Progressing   Problem: Clinical Measurements: Goal: Ability to maintain clinical measurements within normal limits will improve 04/26/2024 1719 by Clotilda Jovita SAUNDERS, RN Outcome: Progressing 04/26/2024 1718 by Clotilda Jovita SAUNDERS, RN Outcome: Progressing Goal: Will remain free from infection 04/26/2024 1719 by Clotilda Jovita SAUNDERS, RN Outcome: Progressing 04/26/2024 1718 by Clotilda Jovita SAUNDERS, RN Outcome: Progressing Goal: Diagnostic test results will improve 04/26/2024 1719 by Clotilda Jovita SAUNDERS, RN Outcome: Progressing 04/26/2024 1718 by Clotilda Jovita SAUNDERS, RN Outcome: Progressing Goal: Respiratory complications will improve 04/26/2024 1719 by Clotilda Jovita SAUNDERS, RN Outcome: Progressing 04/26/2024 1718 by Clotilda Jovita SAUNDERS, RN Outcome: Progressing Goal: Cardiovascular complication will be avoided 04/26/2024 1719 by Clotilda Jovita SAUNDERS, RN Outcome: Progressing 04/26/2024 1718 by Clotilda Jovita SAUNDERS, RN Outcome: Progressing   Problem: Activity: Goal: Risk for activity intolerance will decrease 04/26/2024 1719 by Clotilda Jovita SAUNDERS, RN Outcome: Progressing 04/26/2024 1718 by Clotilda Jovita SAUNDERS, RN Outcome: Progressing   Problem: Nutrition: Goal: Adequate nutrition will be maintained 04/26/2024 1719 by Clotilda Jovita SAUNDERS, RN Outcome: Progressing 04/26/2024 1718 by Clotilda Jovita SAUNDERS, RN Outcome: Progressing   Problem: Coping: Goal: Level of anxiety will decrease 04/26/2024 1719 by Clotilda Jovita SAUNDERS, RN Outcome:  Progressing 04/26/2024 1718 by Clotilda Jovita SAUNDERS, RN Outcome: Progressing   Problem: Elimination: Goal: Will not experience complications related to bowel motility 04/26/2024 1719 by Clotilda Jovita SAUNDERS, RN Outcome: Progressing 04/26/2024 1718 by Clotilda Jovita SAUNDERS, RN Outcome: Progressing Goal: Will not experience complications related to urinary retention 04/26/2024 1719 by Clotilda Jovita SAUNDERS, RN Outcome: Progressing 04/26/2024 1718 by Clotilda Jovita SAUNDERS, RN Outcome: Progressing   Problem: Pain Managment: Goal: General experience of comfort will improve and/or be controlled 04/26/2024 1719 by Clotilda Jovita SAUNDERS, RN Outcome: Progressing 04/26/2024 1718 by Clotilda Jovita SAUNDERS, RN Outcome: Progressing   Problem: Skin Integrity: Goal: Risk for impaired skin integrity will decrease 04/26/2024 1719 by Clotilda Jovita SAUNDERS, RN Outcome: Progressing 04/26/2024 1718 by Clotilda Jovita SAUNDERS, RN Outcome: Progressing   Problem: Education: Goal: Ability to describe self-care measures that may prevent or decrease complications (Diabetes Survival Skills Education) will improve Outcome: Progressing Goal: Individualized Educational Video(s) Outcome: Progressing   Problem: Coping: Goal: Ability to adjust to condition or change in health will improve Outcome: Progressing   Problem: Fluid Volume: Goal: Ability to maintain a balanced intake and output will improve Outcome: Progressing   Problem: Health Behavior/Discharge Planning: Goal: Ability to identify and utilize available resources and services will improve Outcome: Progressing Goal: Ability to manage health-related needs will improve Outcome: Progressing   Problem: Metabolic: Goal: Ability to maintain appropriate glucose levels will improve Outcome: Not Progressing   Problem: Nutritional: Goal: Maintenance of adequate nutrition will improve Outcome: Progressing Goal: Progress toward achieving an optimal weight will improve Outcome: Progressing   Problem: Skin  Integrity: Goal: Risk for impaired skin integrity will decrease Outcome: Progressing   Problem: Tissue Perfusion: Goal: Adequacy of tissue perfusion will improve Outcome: Progressing

## 2024-04-26 NOTE — H&P (Signed)
 History and Physical    Patient: Tamara Wells FMW:[id number] DOB: Oct 18, 1960 DOA: 04/26/2024 DOS: the patient was seen and examined on 04/26/2024 PCP: Vincente Saber, NP  Patient coming from: Home  Chief Complaint:  Chief Complaint  Patient presents with   Altered Mental Status   HPI: Tamara Wells is a 63 y.o. female with medical history significant of type 2 diabetes, class III obesity, hyperlipidemia, hypertension, anxiety and depression, 70 obstructive sleep apnea, vertigo who presented to the emergency department due to altered mental status, found to be hypoglycemic associated with generalized weakness and diaphoresis.  She did not have dinner yesterday evening, ate very little for breakfast and did not have lunch.  However, she uses her insulin , glipizide  and metformin  like usual. He denied fever, chills, rhinorrhea, sore throat, wheezing or hemoptysis.  No chest pain, palpitations, diaphoresis, PND, orthopnea or pitting edema of the lower extremities.  No abdominal pain, nausea, emesis, diarrhea, constipation, melena or hematochezia.  No flank pain, dysuria, frequency or hematuria.  No polyuria, polydipsia, polyphagia or blurred vision.   Lab work: CBC showed a white count of 9.9, hemoglobin 11.8 g/dL platelets 675.  CMP showed a glucose of 237 and creatinine of 1.08 mg/dL, the rest of the labs were normal after sodium/chloride correction to glucose level.  Imaging: CT head without contrast without evidence of acute intracranial normality.   ED course: Initial vital signs were temperature 97.9 F, pulse 100, respiration 12, BP 146/77 mmHg and O2 sat 100% on room air.  The patient received dextrose  50% 25 g IVP and was started on the dextrose  10% infusion at 75 mL/h.    Review of Systems: As mentioned in the history of present illness. All other systems reviewed and are negative.  Past Medical History:  Diagnosis Date   Diabetes mellitus without complication (HCC)    Dx 2006    Hyperlipidemia    Hypertension    Sleep apnea    Vertigo    2002   Past Surgical History:  Procedure Laterality Date   ABDOMINAL HYSTERECTOMY     2000   CARPAL TUNNEL RELEASE Left    CARPAL TUNNEL RELEASE Right 05/28/2019   Procedure: CARPAL TUNNEL RELEASE;  Surgeon: Cleotilde Barrio, MD;  Location: [facility] ORS;  Service: Orthopedics;  Laterality: Right;   COLONOSCOPY N/A 01/11/2024   Procedure: COLONOSCOPY;  Surgeon: Unk Corinn Skiff, MD;  Location: [facility];  Service: Gastroenterology;  Laterality: N/A;  1st CASE; DO NOT MOVE   COLONOSCOPY WITH PROPOFOL  N/A 03/21/2020   Procedure: COLONOSCOPY WITH PROPOFOL ;  Surgeon: Janalyn Keene NOVAK, MD;  Location: [facility] ENDOSCOPY;  Service: Endoscopy;  Laterality: N/A;   TONSILLECTOMY     Social History:  reports that she quit smoking about 19 years ago. Her smoking use included cigarettes. She has never used smokeless tobacco. She reports that she does not drink alcohol and does not use drugs.  Allergies  Allergen Reactions   No Known Allergies     Family History  Problem Relation Age of Onset   Diabetes Mother    Diabetes Father    Diabetes Maternal Grandmother    Breast cancer Neg Hx     Prior to Admission medications   Medication Sig Start Date End Date Taking? Authorizing Provider  albuterol  (VENTOLIN  HFA) 108 (90 Base) MCG/ACT inhaler Inhale 2 puffs into the lungs every 4 hours as needed for wheezing. 12/27/22   Kaur, Charanpreet, NP  aspirin EC 81 MG tablet Take 81 mg by mouth daily.  [provider]  Black Cohosh 40 MG CAPS Take 40 mg by mouth 2 (two) times daily.    [provider]  blood glucose meter kit and supplies Dispense based on patient and insurance preference. Use up to four times daily as directed. (FOR ICD-10 E10.9, E11.9). 03/19/22   Kaur, Charanpreet, NP  Blood Pressure Monitoring (OMRON 3 SERIES BP MONITOR) DEVI use to check blood pressure 05/06/22   Nicks, Roxie PARAS, RPH  Crisaborole   (EUCRISA ) 2 % OINT Apply 1 Application topically to affected skin on breast every morning. 12/05/23   Hester Alm BROCKS, MD  ferrous sulfate  (FEROSUL) 325 (65 FE) MG tablet Take 1 tablet (325 mg total) by mouth daily. 02/10/24   Kaur, Charanpreet, NP  fluticasone  (FLONASE ) 50 MCG/ACT nasal spray Place 2 sprays into both nostrils daily. 04/21/23   Kaur, Charanpreet, NP  furosemide  (LASIX ) 20 MG tablet Take 1 tablet (20 mg total) by mouth every other day. 11/24/23   Kaur, Charanpreet, NP  gabapentin  (NEURONTIN ) 100 MG capsule Take 1 capsule (100 mg total) by mouth 3 (three) times daily. 12/12/23   Kaur, Charanpreet, NP  glipiZIDE  (GLUCOTROL ) 10 MG tablet Take 1 tablet (10 mg total) by mouth 2 (two) times daily. 02/21/24 02/20/25  Vincente Saber, NP  glucose blood test strip Test as directed up to 4 times a day 03/19/22   Kaur, Charanpreet, NP  hydrOXYzine  (VISTARIL ) 25 MG capsule Take 1 capsule (25 mg total) by mouth every 8 (eight) hours as needed. 05/17/23   Kaur, Charanpreet, NP  insulin  degludec (TRESIBA  FLEXTOUCH) 100 UNIT/ML FlexTouch Pen Inject 40 Units into the skin daily. 04/23/24   Kaur, Charanpreet, NP  ketoconazole  (NIZORAL ) 2 % cream Apply 1 Application topically 2 (two) times daily under the breast. 11/25/22   Hester Alm BROCKS, MD  Lancets (FREESTYLE) lancets Test up to 4 times a day as directed 03/19/22   Vincente Saber, NP  latanoprost  (XALATAN ) 0.005 % ophthalmic solution instill 1 drop into affected eye(s) once daily in the evening 07/29/22     latanoprost  (XALATAN ) 0.005 % ophthalmic solution Place 1 drop into both eyes daily in the evening. 10/06/22     latanoprost  (XALATAN ) 0.005 % ophthalmic solution Place 1 drop into affected eye(s) every evening. 10/11/23     magic mouthwash (nystatin , lidocaine , diphenhydrAMINE , alum & mag hydroxide) suspension Swish and spit 5 mLs 3 (three) times daily as needed for mouth pain. 05/17/23   Kaur, Charanpreet, NP  meclizine (ANTIVERT) 25 MG tablet Take 25  mg by mouth 3 (three) times daily as needed for dizziness.     Britta King, MD  meloxicam  (MOBIC ) 15 MG tablet Take 1 tablet (15 mg total) by mouth daily with meal. 11/18/22     metFORMIN  (GLUCOPHAGE ) 1000 MG tablet Take 1 tablet (1,000 mg total) by mouth 2 (two) times daily. 11/11/23 11/10/24  Dineen Rollene MATSU, FNP  mometasone  (ELOCON ) 0.1 % cream Apply 1 Application topically 2 (two) times daily under breast 3 times a week. 11/25/22   Hester Alm BROCKS, MD  Multiple Vitamins-Minerals (CENTRAL-VITE PO) Take 1 tablet by mouth daily.    [provider]  mupirocin  ointment (BACTROBAN ) 2 % Apply 1 Application topically 2 (two) times daily. 04/05/24   Kaur, Charanpreet, NP  rosuvastatin  (CRESTOR ) 10 MG tablet Take 1 tablet (10 mg total) by mouth daily. 11/11/23   Kaur, Charanpreet, NP  tirzepatide  (MOUNJARO ) 10 MG/0.5ML Pen Inject 10 mg into the skin once a week. 05/17/23   Kaur, Charanpreet,  NP  tirzepatide  (MOUNJARO ) 12.5 MG/0.5ML Pen Inject 12.5 mg into the skin once a week. 09/29/23   Kaur, Charanpreet, NP  valsartan -hydrochlorothiazide  (DIOVAN -HCT) 80-12.5 MG tablet Take 1 tablet by mouth daily. 06/23/23   Vincente Saber, NP    Physical Exam: Vitals:   04/26/24 1335 04/26/24 1338 04/26/24 1338 04/26/24 1339  BP:    (!) 146/77  Pulse:  100    Resp:  12  12  Temp:   97.9 F (36.6 C)   TempSrc:   Axillary   SpO2:  100%    Weight: 93.4 kg      Physical Exam Vitals and nursing note reviewed.  Constitutional:      General: She is awake.     Appearance: She is obese. She is ill-appearing.  HENT:     Head: Normocephalic.     Nose: No rhinorrhea.     Mouth/Throat:     Mouth: Mucous membranes are moist.  Eyes:     General: No scleral icterus.    Pupils: Pupils are equal, round, and reactive to light.  Cardiovascular:     Rate and Rhythm: Normal rate and regular rhythm.  Pulmonary:     Effort: Pulmonary effort is normal.     Breath sounds: Normal breath sounds.  Abdominal:      General: Bowel sounds are normal. There is no distension.     Palpations: Abdomen is soft.     Tenderness: There is no abdominal tenderness. There is no right CVA tenderness or left CVA tenderness.  Musculoskeletal:     Cervical back: Neck supple.     Right lower leg: No edema.     Left lower leg: No edema.  Skin:    General: Skin is warm and dry.  Neurological:     General: No focal deficit present.     Mental Status: She is alert and oriented to person, place, and time.  Psychiatric:        Mood and Affect: Mood normal.        Behavior: Behavior normal. Behavior is cooperative.     Data Reviewed:  Results are pending, will review when available.  Assessment and Plan: Principal Problem:   Type 2 diabetes mellitus without complication,  with long-term current use of insulin  (HCC)   Hypoglycemia associated with type 2 diabetes mellitus (HCC) Observation/stepdown. Keep NPO. Continue dextrose  infusion. Monitor CBG closely. Dextrose  50% as needed. Hold insulin , glipizide  and metformin . Continue gabapentin  for neuropathy. Consult diabetes coordinator in a.m.  Active Problems:   Hypertension Continue valsartan  80 mg p.o. daily. (Or formulary equivalent). Continue HCTZ 12.5 mg p.o. daily.    Class 3 severe obesity with body  mass index (BMI) of 40.0 to 44.9 in adult Current BMI 41.47 kg/m. Would benefit from lifestyle modifications. Follow-up closely with PCP and/or bariatric clinic. The patient is using Mounjaro  once a week    Hyperlipidemia Continue rosuvastatin .    Anxiety and depression May use hydroxyzine  as needed for anxiety.    Severe obstructive sleep apnea Continue CPAP at bedtime.    Normocytic anemia On iron  supplementation. Monitor hematocrit and hemoglobin.     Advance Care Planning:   Code Status: Full Code   Consults:   Family Communication:   Severity of Illness: The appropriate patient status for this patient is OBSERVATION.  Observation status is judged to be reasonable and necessary in order to provide the required intensity of service to ensure the patient's safety. The patient's presenting symptoms, physical exam findings,  and initial radiographic and laboratory data in the context of their medical condition is felt to place them at decreased risk for further clinical deterioration. Furthermore, it is anticipated that the patient will be medically stable for discharge from the hospital within 2 midnights of admission.   Author: Alm Dorn Castor, MD 04/26/2024 3:44 PM  For on call review [email or web address].   This document was prepared using Dragon voice recognition software and may contain some unintended transcription errors.

## 2024-04-26 NOTE — ED Provider Notes (Signed)
 [facility] Provider Note    Event Date/Time   First MD Initiated Contact with Patient 04/26/24 1337     (approximate)   History   Altered Mental Status   HPI  Tamara Wells is a 63 year old female with history of T2DM, HTN presenting to the emergency department for evaluation of altered mental status.  Patient works in [employer] at our hospital.  She does have diabetes medications as directed this morning.  Did eat a small breakfast.  While at work she began to feel weak and was noted to be sweaty and confused.  She was brought to the ER.  Blood glucose in triage was 33.  Given an amp of D50.  Reports that she took her medications including metformin , units of insulin , glipizide , and Tresiba  this morning.  Reports taking Mounjaro  as well.    Physical Exam   Triage Vital Signs: ED Triage Vitals  Encounter Vitals Group     BP 04/26/24 1339 (!) 146/77     Girls Systolic BP Percentile --      Girls Diastolic BP Percentile --      Boys Systolic BP Percentile --      Boys Diastolic BP Percentile --      Pulse Rate 04/26/24 1338 100     Resp 04/26/24 1338 12     Temp 04/26/24 1338 97.9 F (36.6 C)     Temp Source 04/26/24 1338 Axillary     SpO2 04/26/24 1338 100 %     Weight 04/26/24 1335 205 lb 14.6 oz (93.4 kg)     Height --      Head Circumference --      Peak Flow --      Pain Score 04/26/24 1334 0     Pain Loc --      Pain Education --      Exclude from Growth Chart --     Most recent vital signs: Vitals:   04/26/24 1338 04/26/24 1339  BP:  (!) 146/77  Pulse:    Resp:  12  Temp: 97.9 F (36.6 C)   SpO2:       General: Awake, interactive  CV:  Regular rate, good peripheral perfusion.  Resp:  Unlabored respirations, lungs clear to auscultation Abd:  Nondistended, soft, nontender Neuro:  Mildly slurred speech on my initial evaluation, improved on reevaluation, 5 out of 5 strength of bilateral upper and lower extremities   ED Results /  Procedures / Treatments   Labs (all labs ordered are listed, but only abnormal results are displayed) Labs Reviewed  CBC WITH DIFFERENTIAL/PLATELET - Abnormal; Notable for the following components:      Result Value   Hemoglobin 11.8 (*)    All other components within normal limits  COMPREHENSIVE METABOLIC PANEL WITH GFR - Abnormal; Notable for the following components:   Sodium 134 (*)    Chloride 97 (*)    Glucose, Bld 237 (*)    Creatinine, Ser 1.08 (*)    GFR, Estimated 58 (*)    All other components within normal limits  CBG MONITORING, ED - Abnormal; Notable for the following components:   Glucose-Capillary 33 (*)    All other components within normal limits  CBG MONITORING, ED - Abnormal; Notable for the following components:   Glucose-Capillary 108 (*)    All other components within normal limits  CBG MONITORING, ED - Abnormal; Notable for the following components:   Glucose-Capillary 68 (*)    All  other components within normal limits  CBG MONITORING, ED - Abnormal; Notable for the following components:   Glucose-Capillary 139 (*)    All other components within normal limits  CBG MONITORING, ED     EKG EKG independently reviewed and interpreted by myself demonstrates:  EKG demonstrates sinus tachycardia at a rate of 103, PR 177, QRS 84, QTc 481, no acute ST changes  RADIOLOGY Imaging independently reviewed and interpreted by myself demonstrates:  CT head without acute bleed  Formal Radiology Read:  CT Head Wo Contrast Result Date: 04/26/2024 CLINICAL DATA:  Mental status change, unknown cause.  Confusion. EXAM: CT HEAD WITHOUT CONTRAST TECHNIQUE: Contiguous axial images were obtained from the base of the skull through the vertex without intravenous contrast. RADIATION DOSE REDUCTION: This exam was performed according to the departmental dose-optimization program which includes automated exposure control, adjustment of the mA and/or kV according to patient size  and/or use of iterative reconstruction technique. COMPARISON:  CT head 10/14/2023. FINDINGS: Brain: No acute intracranial hemorrhage. No CT evidence of acute infarct. No edema, mass effect, or midline shift. The basilar cisterns are patent. Ventricles: The ventricles are normal. Vascular: Atherosclerotic calcifications of the carotid siphons. No hyperdense vessel. Skull: No acute or aggressive finding. Orbits: Orbits are symmetric. Sinuses: Mild mucosal thickening in the ethmoid sinuses. Other: Mastoid air cells are clear. IMPRESSION: No CT evidence of acute intracranial abnormality. Electronically Signed   By: Donnice Mania M.D.   On: 04/26/2024 14:44    PROCEDURES:  Critical Care performed: Yes, see critical care procedure note(s)  CRITICAL CARE Performed by: Nilsa Dade   Total critical care time: 32 minutes  Critical care time was exclusive of separately billable procedures and treating other patients.  Critical care was necessary to treat or prevent imminent or life-threatening deterioration.  Critical care was time spent personally by me on the following activities: development of treatment plan with patient and/or surrogate as well as nursing, discussions with consultants, evaluation of patient's response to treatment, examination of patient, obtaining history from patient or surrogate, ordering and performing treatments and interventions, ordering and review of laboratory studies, ordering and review of radiographic studies, pulse oximetry and re-evaluation of patient's condition.    Procedures   MEDICATIONS ORDERED IN ED: Medications  dextrose  10 % infusion ( Intravenous New Bag/Given 04/26/24 1351)  dextrose  50 % solution 25 g ( Intravenous Given 04/26/24 1333)     IMPRESSION / MDM / ASSESSMENT AND PLAN / ED COURSE  I reviewed the triage vital signs and the nursing notes.  Differential diagnosis includes, but is not limited to, hypoglycemia related to decreased p.o. intake,  multiple diabetic medication use including sulfonylurea, no clinical history suggestive of acute infection  Patient's presentation is most consistent with acute presentation with potential threat to life or bodily function.  63 year old presenting altered found to be significantly hyperglycemic in triage.  BGL 33 to 108 after receiving an amp of D50, but shortly after did decrease down to 68.  With this, D10 drip was initiated.  Left on this for an over an hour and repeat blood glucose at 87.  Patient mental status much improved on reevaluation.  Reports taking her sulfonylurea this morning, I do think admission is appropriate for further monitoring.  BGL improved here, but if she has recurrent hypoglycemia, may be appropriate for octreotide.  Case discussed with hospitalist team.  They will evaluate for anticipated admission.     FINAL CLINICAL IMPRESSION(S) / ED DIAGNOSES   Final diagnoses:  Hypoglycemia     Rx / DC Orders   ED Discharge Orders     None        Note:  This document was prepared using Dragon voice recognition software and may include unintentional dictation errors.   Levander Slate, MD 04/26/24 [phone]

## 2024-04-26 NOTE — Plan of Care (Signed)

## 2024-04-26 NOTE — Progress Notes (Signed)
 1640 patient arrived from ED alert x4 on room air able to make all needs known ambulated to bed on D10 gtt as ordered . Patient able to ambulate to bathroom with stand by assist.

## 2024-04-26 NOTE — ED Notes (Signed)
 Assisted pt to bathroom. Pt needed minimal assistance. Pt urinated at this time. Pt is back in bed and resting comfortably.

## 2024-04-26 NOTE — ED Notes (Signed)
Transferred to CT

## 2024-04-26 NOTE — ED Triage Notes (Signed)
 Arrives to triage with confusion. Patient is AAOx3.  Skin diaphoretic.  Patient states she doesn't know what is wrong with her.  Affirms she is diabetic and has not eaten today.

## 2024-04-27 DIAGNOSIS — E11649 Type 2 diabetes mellitus with hypoglycemia without coma: Secondary | ICD-10-CM | POA: Diagnosis not present

## 2024-04-27 LAB — GLUCOSE, CAPILLARY
Glucose-Capillary: 139 mg/dL — ABNORMAL HIGH (ref 70–99)
Glucose-Capillary: 141 mg/dL — ABNORMAL HIGH (ref 70–99)
Glucose-Capillary: 146 mg/dL — ABNORMAL HIGH (ref 70–99)
Glucose-Capillary: 150 mg/dL — ABNORMAL HIGH (ref 70–99)
Glucose-Capillary: 154 mg/dL — ABNORMAL HIGH (ref 70–99)
Glucose-Capillary: 155 mg/dL — ABNORMAL HIGH (ref 70–99)
Glucose-Capillary: 158 mg/dL — ABNORMAL HIGH (ref 70–99)
Glucose-Capillary: 161 mg/dL — ABNORMAL HIGH (ref 70–99)
Glucose-Capillary: 163 mg/dL — ABNORMAL HIGH (ref 70–99)
Glucose-Capillary: 169 mg/dL — ABNORMAL HIGH (ref 70–99)
Glucose-Capillary: 169 mg/dL — ABNORMAL HIGH (ref 70–99)
Glucose-Capillary: 170 mg/dL — ABNORMAL HIGH (ref 70–99)
Glucose-Capillary: 172 mg/dL — ABNORMAL HIGH (ref 70–99)
Glucose-Capillary: 175 mg/dL — ABNORMAL HIGH (ref 70–99)
Glucose-Capillary: 180 mg/dL — ABNORMAL HIGH (ref 70–99)
Glucose-Capillary: 181 mg/dL — ABNORMAL HIGH (ref 70–99)

## 2024-04-27 LAB — BASIC METABOLIC PANEL WITH GFR
Anion gap: 8 (ref 5–15)
BUN: 18 mg/dL (ref 8–23)
CO2: 26 mmol/L (ref 22–32)
Calcium: 8.8 mg/dL — ABNORMAL LOW (ref 8.9–10.3)
Chloride: 103 mmol/L (ref 98–111)
Creatinine, Ser: 0.91 mg/dL (ref 0.44–1.00)
GFR, Estimated: >60 mL/min (ref >60)
Glucose, Bld: 162 mg/dL — ABNORMAL HIGH (ref 70–99)
Potassium: 4 mmol/L (ref 3.5–5.1)
Sodium: 137 mmol/L (ref 135–145)

## 2024-04-27 LAB — HIV ANTIBODY (ROUTINE TESTING W REFLEX): HIV Screen 4th Generation wRfx: NONREACTIVE

## 2024-04-27 MED ORDER — POLYETHYLENE GLYCOL 3350 17 G PO PACK
17.0000 g | PACK | Freq: Every day | ORAL | Status: DC
  Administered 2024-04-27: 17 g via ORAL
  Filled 2024-04-27: qty 1

## 2024-04-27 NOTE — Discharge Summary (Signed)
 Physician Discharge Summary   Patient: Tamara Wells MRN: [id number] DOB: [birth year]  Admit date:     04/26/2024  Discharge date: 04/27/24  Discharge Physician: Nena Rebel   PCP: Vincente Saber, NP   Recommendations at discharge:    Discontinue Glipizide  due to hypoglycemia, please follow up with PCP/Endocrinology before restarting Glipizide . Hold of on any anti DM meds until 8/2 AM due to hypoglycemia.   Discharge Diagnoses: Principal Problem:   Hypoglycemia associated with type 2 diabetes mellitus (HCC) Active Problems:   Hypertension   Type 2 diabetes mellitus without complication, with long-term current use of insulin  (HCC)   Class 3 severe obesity with body mass index (BMI) of 40.0 to 44.9 in adult   Hyperlipidemia   Anxiety and depression   Severe obstructive sleep apnea   Normocytic anemia  Resolved Problems:   * No resolved hospital problems. *  Hospital Course: HPI: Tamara Wells is a 63 y.o. female with medical history significant of type 2 diabetes, class III obesity, hyperlipidemia, hypertension, anxiety and depression, 70 obstructive sleep apnea, vertigo who presented to the emergency department due to altered mental status, found to be hypoglycemic associated with generalized weakness and diaphoresis.  She did not have dinner yesterday evening, ate very little for breakfast and did not have lunch.  However, she uses her insulin , glipizide  and metformin  like usual. He denied fever, chills, rhinorrhea, sore throat, wheezing or hemoptysis.  No chest pain, palpitations, diaphoresis, PND, orthopnea or pitting edema of the lower extremities.  No abdominal pain, nausea, emesis, diarrhea, constipation, melena or hematochezia.  No flank pain, dysuria, frequency or hematuria.  No polyuria, polydipsia, polyphagia or blurred vision.    Lab work: CBC showed a white count of 9.9, hemoglobin 11.8 g/dL platelets 675.  CMP showed a glucose of 237 and creatinine of 1.08 mg/dL,  the rest of the labs were normal after sodium/chloride correction to glucose level.  Assessment and Plan: She was treated with IV D10, improved. Was able to eat well. Did well of IVF and dextrose . She will be off Glipizide  until evaluated by PCP and or Endocrinology. She will be discharged home.        Consultants: None Procedures performed: None  Disposition: Home Diet recommendation:  Carb modified diet DISCHARGE MEDICATION: Allergies as of 04/27/2024       Reactions   No Known Allergies         Medication List     STOP taking these medications    glipiZIDE  10 MG tablet Commonly known as: GLUCOTROL        TAKE these medications    albuterol  108 (90 Base) MCG/ACT inhaler Commonly known as: VENTOLIN  HFA Inhale 2 puffs into the lungs every 4 hours as needed for wheezing.   aspirin EC 81 MG tablet Take 81 mg by mouth daily.   Black Cohosh 40 MG Caps Take 40 mg by mouth 2 (two) times daily.   CENTRAL-VITE PO Take 1 tablet by mouth daily.   Eucrisa  2 % Oint Generic drug: Crisaborole  Apply 1 Application topically to affected skin on breast every morning.   FeroSul 325 (65 Fe) MG tablet Generic drug: ferrous sulfate  Take 1 tablet (325 mg total) by mouth daily.   fluticasone  50 MCG/ACT nasal spray Commonly known as: FLONASE  Place 2 sprays into both nostrils daily.   FreeStyle Freedom Lite w/Device Kit Use up to 4 times a day as directed (Dispense based on patient and insurance preference. Use up to four times daily  as directed. (FOR ICD-10 E10.9, E11.9).)   freestyle lancets Test up to 4 times a day as directed   FREESTYLE LITE test strip Generic drug: glucose blood Test as directed up to 4 times a day   furosemide  20 MG tablet Commonly known as: LASIX  Take 1 tablet (20 mg total) by mouth every other day.   gabapentin  100 MG capsule Commonly known as: NEURONTIN  Take 1 capsule (100 mg total) by mouth 3 (three) times daily.   hydrOXYzine  25 MG  capsule Commonly known as: VISTARIL  Take 1 capsule (25 mg total) by mouth every 8 (eight) hours as needed.   ketoconazole  2 % cream Commonly known as: NIZORAL  Apply 1 Application topically 2 (two) times daily under the breast.   latanoprost  0.005 % ophthalmic solution Commonly known as: XALATAN  instill 1 drop into affected eye(s) once daily in the evening   latanoprost  0.005 % ophthalmic solution Commonly known as: XALATAN  Place 1 drop into both eyes daily in the evening.   latanoprost  0.005 % ophthalmic solution Commonly known as: XALATAN  Place 1 drop into affected eye(s) every evening.   magic mouthwash (nystatin , lidocaine , diphenhydrAMINE , alum & mag hydroxide) suspension Swish and spit 5 mLs 3 (three) times daily as needed for mouth pain.   meclizine 25 MG tablet Commonly known as: ANTIVERT Take 25 mg by mouth 3 (three) times daily as needed for dizziness.   meloxicam  15 MG tablet Commonly known as: Mobic  Take 1 tablet (15 mg total) by mouth daily with meal.   metFORMIN  1000 MG tablet Commonly known as: GLUCOPHAGE  Take 1 tablet (1,000 mg total) by mouth 2 (two) times daily.   mometasone  0.1 % cream Commonly known as: ELOCON  Apply 1 Application topically 2 (two) times daily under breast 3 times a week.   Mounjaro  10 MG/0.5ML Pen Generic drug: tirzepatide  Inject 10 mg into the skin once a week.   Mounjaro  12.5 MG/0.5ML Pen Generic drug: tirzepatide  Inject 12.5 mg into the skin once a week.   mupirocin  ointment 2 % Commonly known as: BACTROBAN  Apply 1 Application topically 2 (two) times daily.   Omron 3 Series BP Monitor Devi use to check blood pressure   rosuvastatin  10 MG tablet Commonly known as: Crestor  Take 1 tablet (10 mg total) by mouth daily.   Tresiba  FlexTouch 100 UNIT/ML FlexTouch Pen Generic drug: insulin  degludec Inject 40 Units into the skin daily.   valsartan -hydrochlorothiazide  80-12.5 MG tablet Commonly known as: DIOVAN -HCT Take 1  tablet by mouth daily.        Discharge Exam: Filed Weights   04/26/24 1335 04/26/24 1640  Weight: 93.4 kg 90 kg   Constitutional: Alert, awake, calm, comfortable HEENT: Neck supple Respiratory: clear to auscultation bilaterally, no wheezing, no crackles. Normal respiratory effort. No accessory muscle use.  Cardiovascular: Regular rate and rhythm, no murmurs / rubs / gallops. No extremity edema. 2+ pedal pulses. No carotid bruits.  Abdomen: no tenderness, no masses palpated. No hepatosplenomegaly. Bowel sounds positive.  Musculoskeletal: no clubbing / cyanosis. No joint deformity upper and lower extremities. Good ROM, no contractures. Normal muscle tone.  Skin: no rashes, lesions, ulcers. No induration Neurologic: CN 2-12 grossly intact. Sensation intact, DTR normal. Strength 5/5 x all 4 extremities.  Psychiatric: Normal judgment and insight. Alert and oriented x 3. Normal mood.    Condition at discharge: stable  The results of significant diagnostics from this hospitalization (including imaging, microbiology, ancillary and laboratory) are listed below for reference.   Imaging Studies: CT Head Wo Contrast  Result Date: 04/26/2024 CLINICAL DATA:  Mental status change, unknown cause.  Confusion. EXAM: CT HEAD WITHOUT CONTRAST TECHNIQUE: Contiguous axial images were obtained from the base of the skull through the vertex without intravenous contrast. RADIATION DOSE REDUCTION: This exam was performed according to the departmental dose-optimization program which includes automated exposure control, adjustment of the mA and/or kV according to patient size and/or use of iterative reconstruction technique. COMPARISON:  CT head 10/14/2023. FINDINGS: Brain: No acute intracranial hemorrhage. No CT evidence of acute infarct. No edema, mass effect, or midline shift. The basilar cisterns are patent. Ventricles: The ventricles are normal. Vascular: Atherosclerotic calcifications of the carotid siphons. No  hyperdense vessel. Skull: No acute or aggressive finding. Orbits: Orbits are symmetric. Sinuses: Mild mucosal thickening in the ethmoid sinuses. Other: Mastoid air cells are clear. IMPRESSION: No CT evidence of acute intracranial abnormality. Electronically Signed   By: Donnice Mania M.D.   On: 04/26/2024 14:44    Microbiology: Results for orders placed or performed during the hospital encounter of 04/26/24  MRSA Next Gen by PCR, Nasal     Status: None   Collection Time: 04/26/24  4:45 PM   Specimen: Nasal Mucosa; Nasal Swab  Result Value Ref Range Status   MRSA by PCR Next Gen NOT DETECTED NOT DETECTED Final    Comment: (NOTE) The GeneXpert MRSA Assay (FDA approved for NASAL specimens only), is one component of a comprehensive MRSA colonization surveillance program. It is not intended to diagnose MRSA infection nor to guide or monitor treatment for MRSA infections. Test performance is not FDA approved in patients less than 64 years old. Performed at [facility], [address] [place], [place] [address]     Labs: CBC: Recent Labs  Lab 04/26/24 1336  WBC 9.9  NEUTROABS 6.5  HGB 11.8*  HCT 38.7  MCV 93.0  PLT 324   Basic Metabolic Panel: Recent Labs  Lab 04/26/24 1408 04/27/24 0554  NA 134* 137  K 4.3 4.0  CL 97* 103  CO2 26 26  GLUCOSE 237* 162*  BUN 23 18  CREATININE 1.08* 0.91  CALCIUM  9.2 8.8*   Liver Function Tests: Recent Labs  Lab 04/26/24 1408  AST 29  ALT 21  ALKPHOS 55  BILITOT <0.2  PROT 7.3  ALBUMIN 4.0   CBG: Recent Labs  Lab 04/27/24 1123 04/27/24 1228 04/27/24 1340 04/27/24 1435 04/27/24 1542  GLUCAP 175* 139* 181* 155* 172*    Discharge time spent: greater than 30 minutes.  Signed: Nena Rebel, MD [facility] 04/27/2024

## 2024-04-27 NOTE — Progress Notes (Signed)
   04/27/24 1330  Spiritual Encounters  Type of Visit Initial  Care provided to: Patient  Referral source Non-clinical staff  Reason for visit Routine spiritual support  Spiritual Framework  Presenting Themes Goals in life/care  Interventions  Spiritual Care Interventions Made Compassionate presence;Prayer  Intervention Outcomes  Outcomes Connection to spiritual care;Awareness around self/spiritual resourses  Spiritual Care Plan  Spiritual Care Issues Still Outstanding No further spiritual care needs at this time (see row info)   Chaplain responded to a member of [facility]' request to stop in and see pt. Pt and chaplain have a relationship and [facility] member knew this and knew chaplain would want to pray for pt. Chaplain prayed for pt

## 2024-04-27 NOTE — Progress Notes (Signed)
 D10 infusion stopped per Dr. Paudel. Will continue to monitor CBG throughout morning/afternoon.

## 2024-04-27 NOTE — Hospital Course (Signed)
 HPI: Tamara Wells is a 63 y.o. female with medical history significant of type 2 diabetes, class III obesity, hyperlipidemia, hypertension, anxiety and depression, 70 obstructive sleep apnea, vertigo who presented to the emergency department due to altered mental status, found to be hypoglycemic associated with generalized weakness and diaphoresis.  She did not have dinner yesterday evening, ate very little for breakfast and did not have lunch.  However, she uses her insulin , glipizide  and metformin  like usual. He denied fever, chills, rhinorrhea, sore throat, wheezing or hemoptysis.  No chest pain, palpitations, diaphoresis, PND, orthopnea or pitting edema of the lower extremities.  No abdominal pain, nausea, emesis, diarrhea, constipation, melena or hematochezia.  No flank pain, dysuria, frequency or hematuria.  No polyuria, polydipsia, polyphagia or blurred vision.    Lab work: CBC showed a white count of 9.9, hemoglobin 11.8 g/dL platelets 675.  CMP showed a glucose of 237 and creatinine of 1.08 mg/dL, the rest of the labs were normal after sodium/chloride correction to glucose level.

## 2024-04-27 NOTE — Progress Notes (Signed)
 Pt discharged home at this time. Discharge education given to patient. No questions at this time from patient. Pt taken to medical mall in wheelchair.

## 2024-05-04 ENCOUNTER — Other Ambulatory Visit: Payer: Self-pay

## 2024-05-04 ENCOUNTER — Ambulatory Visit: Admitting: Nurse Practitioner

## 2024-05-04 ENCOUNTER — Encounter: Payer: Self-pay | Admitting: Nurse Practitioner

## 2024-05-04 VITALS — BP 116/68 | HR 98 | Temp 97.9°F | Ht <= 58 in | Wt 198.6 lb

## 2024-05-04 DIAGNOSIS — Z7985 Long-term (current) use of injectable non-insulin antidiabetic drugs: Secondary | ICD-10-CM | POA: Diagnosis not present

## 2024-05-04 DIAGNOSIS — E119 Type 2 diabetes mellitus without complications: Secondary | ICD-10-CM | POA: Diagnosis not present

## 2024-05-04 DIAGNOSIS — Z09 Encounter for follow-up examination after completed treatment for conditions other than malignant neoplasm: Secondary | ICD-10-CM

## 2024-05-04 DIAGNOSIS — Z7984 Long term (current) use of oral hypoglycemic drugs: Secondary | ICD-10-CM | POA: Diagnosis not present

## 2024-05-04 DIAGNOSIS — Z794 Long term (current) use of insulin: Secondary | ICD-10-CM

## 2024-05-04 MED ORDER — DEXCOM G7 SENSOR MISC
10 refills | Status: AC
  Filled 2024-05-04: qty 3, 30d supply, fill #0
  Filled 2024-05-24 – 2024-05-29 (×2): qty 3, 30d supply, fill #1
  Filled 2024-07-15: qty 3, 30d supply, fill #2
  Filled 2024-08-23: qty 3, 30d supply, fill #3
  Filled 2024-09-26: qty 3, 30d supply, fill #4
  Filled 2024-10-31: qty 3, 30d supply, fill #5

## 2024-05-04 NOTE — Progress Notes (Signed)
 Established Patient Office Visit  Subjective:  Patient ID: Tamara Wells, female    DOB: September 22, 1961  Age: 63 y.o. MRN: [id number]  CC:  Chief Complaint  Patient presents with   Hospitalization Follow-up   Discussed the use of a AI scribe software for clinical note transcription with the patient, who gave verbal consent to proceed.  HPI  Tamara Wells is a 63 year old female with diabetes who presents for a hospital follow-up after a hypoglycemic episode.  She experienced a significant hypoglycemic episode last Thursday  04/26/24 with a blood sugar level of 33 mg/dL. She was found confused and unsteady at work and was admitted to the ICU for observation. Her medication regimen was adjusted, discontinuing Glipizide .   She did not eat the night before the episode and had minimal food intake the morning of the incident and took Tresiba  in the morning.  She is now focusing on better nutrition and keeping snacks available to prevent further episodes.  Since the incident, she feels fatigued and lacks energy but reports an overall improvement. She takes IronVil daily for low hemoglobin. She has not returned to work since the episode.  HPI   Past Medical History:  Diagnosis Date   Diabetes mellitus without complication (HCC)    Dx 2006   Hyperlipidemia    Hypertension    Sleep apnea    Vertigo    2002    Past Surgical History:  Procedure Laterality Date   ABDOMINAL HYSTERECTOMY     2000   CARPAL TUNNEL RELEASE Left    CARPAL TUNNEL RELEASE Right 05/28/2019   Procedure: CARPAL TUNNEL RELEASE;  Surgeon: Cleotilde Barrio, MD;  Location: [facility] ORS;  Service: Orthopedics;  Laterality: Right;   COLONOSCOPY N/A 01/11/2024   Procedure: COLONOSCOPY;  Surgeon: Unk Corinn Skiff, MD;  Location: [facility] ENDOSCOPY;  Service: Gastroenterology;  Laterality: N/A;  1st CASE; DO NOT MOVE   COLONOSCOPY WITH PROPOFOL  N/A 03/21/2020   Procedure: COLONOSCOPY WITH PROPOFOL ;  Surgeon: Janalyn Keene NOVAK, MD;  Location: [facility] ENDOSCOPY;  Service: Endoscopy;  Laterality: N/A;   TONSILLECTOMY      Family History  Problem Relation Age of Onset   Diabetes Mother    Diabetes Father    Diabetes Maternal Grandmother    Breast cancer Neg Hx     Social History   Socioeconomic History   Marital status: Married    Spouse name: Not on file   Number of children: Not on file   Years of education: Not on file   Highest education level: Not on file  Occupational History   Not on file  Tobacco Use   Smoking status: Former    Current packs/day: 0.00    Types: Cigarettes    Quit date: 09/27/2004    Years since quitting: 19.6   Smokeless tobacco: Never  Vaping Use   Vaping status: Never Used  Substance and Sexual Activity   Alcohol use: No   Drug use: No   Sexual activity: Not Currently    Birth control/protection: Surgical  Other Topics Concern   Not on file  Social History Narrative   Not on file   Social Drivers of Health   Financial Resource Strain: Not on file  Food Insecurity: No Food Insecurity (04/26/2024)   Hunger Vital Sign    Worried About Running Out of Food in the Last Year: Never true    Ran Out of Food in the Last Year: Never true  Transportation Needs: No  Transportation Needs (04/26/2024)   PRAPARE - Administrator, Civil Service (Medical): No    Lack of Transportation (Non-Medical): No  Physical Activity: Not on file  Stress: Not on file  Social Connections: Not on file  Intimate Partner Violence: Not At Risk (04/26/2024)   Humiliation, Afraid, Rape, and Kick questionnaire    Fear of Current or Ex-Partner: No    Emotionally Abused: No    Physically Abused: No    Sexually Abused: No     Outpatient Medications Prior to Visit  Medication Sig Dispense Refill   albuterol  (VENTOLIN  HFA) 108 (90 Base) MCG/ACT inhaler Inhale 2 puffs into the lungs every 4 hours as needed for wheezing. 6.7 g 0   aspirin EC 81 MG tablet Take 81 mg by mouth daily.      Black Cohosh 40 MG CAPS Take 40 mg by mouth 2 (two) times daily.     blood glucose meter kit and supplies Dispense based on patient and insurance preference. Use up to four times daily as directed. (FOR ICD-10 E10.9, E11.9). 1 each 0   Blood Pressure Monitoring (OMRON 3 SERIES BP MONITOR) DEVI use to check blood pressure 1 each 0   Crisaborole  (EUCRISA ) 2 % OINT Apply 1 Application topically to affected skin on breast every morning. 60 g 6   ferrous sulfate  (FEROSUL) 325 (65 FE) MG tablet Take 1 tablet (325 mg total) by mouth daily. 30 tablet 0   fluticasone  (FLONASE ) 50 MCG/ACT nasal spray Place 2 sprays into both nostrils daily. 16 g 6   furosemide  (LASIX ) 20 MG tablet Take 1 tablet (20 mg total) by mouth every other day. 30 tablet 3   gabapentin  (NEURONTIN ) 100 MG capsule Take 1 capsule (100 mg total) by mouth 3 (three) times daily. 90 capsule 3   glucose blood test strip Test as directed up to 4 times a day 100 each 0   hydrOXYzine  (VISTARIL ) 25 MG capsule Take 1 capsule (25 mg total) by mouth every 8 (eight) hours as needed. 30 capsule 0   insulin  degludec (TRESIBA  FLEXTOUCH) 100 UNIT/ML FlexTouch Pen Inject 40 Units into the skin daily. 12 mL 0   ketoconazole  (NIZORAL ) 2 % cream Apply 1 Application topically 2 (two) times daily under the breast. 60 g 5   Lancets (FREESTYLE) lancets Test up to 4 times a day as directed 100 each 0   latanoprost  (XALATAN ) 0.005 % ophthalmic solution instill 1 drop into affected eye(s) once daily in the evening 5 mL 3   latanoprost  (XALATAN ) 0.005 % ophthalmic solution Place 1 drop into both eyes daily in the evening. 2.5 mL 0   latanoprost  (XALATAN ) 0.005 % ophthalmic solution Place 1 drop into affected eye(s) every evening. 5 mL 1   magic mouthwash (nystatin , lidocaine , diphenhydrAMINE , alum & mag hydroxide) suspension Swish and spit 5 mLs 3 (three) times daily as needed for mouth pain. 180 mL 0   meclizine (ANTIVERT) 25 MG tablet Take 25 mg by mouth 3 (three)  times daily as needed for dizziness.      meloxicam  (MOBIC ) 15 MG tablet Take 1 tablet (15 mg total) by mouth daily with meal. 90 tablet 2   metFORMIN  (GLUCOPHAGE ) 1000 MG tablet Take 1 tablet (1,000 mg total) by mouth 2 (two) times daily. 180 tablet 2   mometasone  (ELOCON ) 0.1 % cream Apply 1 Application topically 2 (two) times daily under breast 3 times a week. 45 g 5   Multiple Vitamins-Minerals (CENTRAL-VITE PO) Take  1 tablet by mouth daily.     mupirocin  ointment (BACTROBAN ) 2 % Apply 1 Application topically 2 (two) times daily. 22 g 0   rosuvastatin  (CRESTOR ) 10 MG tablet Take 1 tablet (10 mg total) by mouth daily. 90 tablet 3   tirzepatide  (MOUNJARO ) 10 MG/0.5ML Pen Inject 10 mg into the skin once a week. 6 mL 2   tirzepatide  (MOUNJARO ) 12.5 MG/0.5ML Pen Inject 12.5 mg into the skin once a week. 6 mL 4   valsartan -hydrochlorothiazide  (DIOVAN -HCT) 80-12.5 MG tablet Take 1 tablet by mouth daily. 90 tablet 3   No facility-administered medications prior to visit.    Allergies  Allergen Reactions   No Known Allergies     ROS Review of Systems Negative unless indicated in HPI.    Objective:    Physical Exam Constitutional:      Appearance: Normal appearance.  HENT:     Mouth/Throat:     Mouth: Mucous membranes are moist.  Eyes:     Conjunctiva/sclera: Conjunctivae normal.     Pupils: Pupils are equal, round, and reactive to light.  Cardiovascular:     Rate and Rhythm: Normal rate and regular rhythm.     Pulses: Normal pulses.     Heart sounds: Normal heart sounds.  Pulmonary:     Effort: Pulmonary effort is normal.     Breath sounds: Normal breath sounds.  Musculoskeletal:     Cervical back: Normal range of motion. No tenderness.  Skin:    General: Skin is warm.     Findings: No bruising.  Neurological:     General: No focal deficit present.     Mental Status: She is alert and oriented to person, place, and time. Mental status is at baseline.  Psychiatric:         Mood and Affect: Mood normal.        Behavior: Behavior normal.        Thought Content: Thought content normal.        Judgment: Judgment normal.     BP 116/68   Pulse 98   Temp 97.9 F (36.6 C)   Ht 4' 10 (1.473 m)   Wt 198 lb 9.6 oz (90.1 kg)   SpO2 96%   BMI 41.51 kg/m  Wt Readings from Last 3 Encounters:  05/04/24 198 lb 9.6 oz (90.1 kg)  04/26/24 198 lb 6.6 oz (90 kg)  04/05/24 206 lb (93.4 kg)     Health Maintenance  Topic Date Due   Pneumococcal Vaccine: 50+ Years (1 of 2 - PCV) Never done   Zoster Vaccines- Shingrix (1 of 2) Never done   Cervical Cancer Screening (HPV/Pap Cotest)  Never done   OPHTHALMOLOGY EXAM  06/30/2022   COVID-19 Vaccine (7 - 2024-25 season) 05/20/2024 (Originally 05/29/2023)   INFLUENZA VACCINE  12/25/2024 (Originally 04/27/2024)   HEMOGLOBIN A1C  10/06/2024   FOOT EXAM  12/07/2024   Colonoscopy  01/10/2025   Diabetic kidney evaluation - Urine ACR  04/05/2025   Diabetic kidney evaluation - eGFR measurement  04/27/2025   MAMMOGRAM  01/26/2026   DTaP/Tdap/Td (4 - Td or Tdap) 07/05/2032   Hepatitis C Screening  Completed   HIV Screening  Completed   Hepatitis B Vaccines 19-59 Average Risk  Aged Out   HPV VACCINES  Aged Out   Meningococcal B Vaccine  Aged Out    There are no preventive care reminders to display for this patient.  Lab Results  Component Value Date   TSH 1.60  12/08/2023   Lab Results  Component Value Date   WBC 9.9 04/26/2024   HGB 11.8 (L) 04/26/2024   HCT 38.7 04/26/2024   MCV 93.0 04/26/2024   PLT 324 04/26/2024   Lab Results  Component Value Date   NA 137 04/27/2024   K 4.0 04/27/2024   CO2 26 04/27/2024   GLUCOSE 162 (H) 04/27/2024   BUN 18 04/27/2024   CREATININE 0.91 04/27/2024   BILITOT <0.2 04/26/2024   ALKPHOS 55 04/26/2024   AST 29 04/26/2024   ALT 21 04/26/2024   PROT 7.3 04/26/2024   ALBUMIN 4.0 04/26/2024   CALCIUM  8.8 (L) 04/27/2024   ANIONGAP 8 04/27/2024   EGFR 86 01/07/2023   GFR 53.01  (L) 04/05/2024   Lab Results  Component Value Date   CHOL 112 04/05/2024   Lab Results  Component Value Date   HDL 37.60 (L) 04/05/2024   Lab Results  Component Value Date   LDLCALC 47 04/05/2024   Lab Results  Component Value Date   TRIG 134.0 04/05/2024   Lab Results  Component Value Date   CHOLHDL 3 04/05/2024   Lab Results  Component Value Date   HGBA1C 6.9 (H) 04/05/2024      Assessment & Plan:  Type 2 diabetes mellitus without complication, with long-term current use of insulin  [facility]) Assessment & Plan: Patient was recently hospitalized on 04/26/2024 to 04/27/2024 due to hypoglycemia.  Glipizide  discontinued.  Currently on metformin , insulin  and Mounjaro . Lab Results  Component Value Date   HGBA1C 6.9 (H) 04/05/2024  -We will decrease metformin  to 1000 mg once a day. -Continue Tresiba  40 units and Mounjaro  weekly. -Patient agreeable to use continuous glucose monitor.  Sample CGM provided in the office today. -Educated on regular meals to prevent hypoglycemia. -Will schedule nurse visit for CGM teaching.  Orders: -     Dexcom G7 Sensor; Use as directed  Dispense: 3 each; Refill: 10  Hospital discharge follow-up Assessment & Plan: Patient clinically stable, no recurrent episodes reported since discharge.  Workup during the hospitalization was reviewed and discussed with patient.     Follow-up: Return in about 4 weeks (around 06/01/2024).   Fishel Wamble, NP

## 2024-05-04 NOTE — Patient Instructions (Signed)
-  Stop taking Glipizide . -Reduce Metformin  to 1000 mg once daily at night. -Take 40 units of insulin  daily as you were taking -Start using continuous glucose monitoring. -Continue taking Mounjaro  weekly. -Eat regular meals to prevent low blood sugar.

## 2024-05-16 ENCOUNTER — Other Ambulatory Visit: Payer: Self-pay

## 2024-05-17 ENCOUNTER — Telehealth: Payer: Self-pay

## 2024-05-17 ENCOUNTER — Other Ambulatory Visit: Payer: Self-pay

## 2024-05-17 NOTE — Telephone Encounter (Signed)
 Copied from CRM [id number]. Topic: General - Other >> May 17, 2024  3:15 PM Sophia H wrote: Reason for CRM: Patient states has been having issues with her dexcom sensor falling off. Only has one left and wants to know if clinic can give any advice on how to put on or help her put it on in office. # [phone]

## 2024-05-17 NOTE — Telephone Encounter (Signed)
 Can you put a written order for dexcom teaching with a nurse so we can ensure that she is placing the Dexcom correctly?

## 2024-05-18 ENCOUNTER — Encounter: Payer: Self-pay | Admitting: Nurse Practitioner

## 2024-05-18 DIAGNOSIS — Z09 Encounter for follow-up examination after completed treatment for conditions other than malignant neoplasm: Secondary | ICD-10-CM | POA: Insufficient documentation

## 2024-05-18 DIAGNOSIS — G4733 Obstructive sleep apnea (adult) (pediatric): Secondary | ICD-10-CM | POA: Diagnosis not present

## 2024-05-18 NOTE — Telephone Encounter (Signed)
 Please schedule patient for nurse visit for [facility] teaching.

## 2024-05-18 NOTE — Assessment & Plan Note (Signed)
 Patient was recently hospitalized on 04/26/2024 to 04/27/2024 due to hypoglycemia.  Glipizide  discontinued.  Currently on metformin , insulin  and Mounjaro . Lab Results  Component Value Date   HGBA1C 6.9 (H) 04/05/2024  -We will decrease metformin  to 1000 mg once a day. -Continue Tresiba  40 units and Mounjaro  weekly. -Patient agreeable to use continuous glucose monitor.  Sample CGM provided in the office today. -Educated on regular meals to prevent hypoglycemia. -Will schedule nurse visit for CGM teaching.

## 2024-05-18 NOTE — Assessment & Plan Note (Signed)
 Patient clinically stable, no recurrent episodes reported since discharge.  Workup during the hospitalization was reviewed and discussed with patient.

## 2024-05-21 NOTE — Telephone Encounter (Signed)
 Spoke with Patient that stated she has work so she wants to try one more time on her own then if she can not do it she will call and schedule a nurse visit for [facility] teaching.

## 2024-05-23 ENCOUNTER — Other Ambulatory Visit: Payer: Self-pay | Admitting: Nurse Practitioner

## 2024-05-24 ENCOUNTER — Other Ambulatory Visit: Payer: Self-pay

## 2024-05-24 MED ORDER — TRESIBA FLEXTOUCH 100 UNIT/ML ~~LOC~~ SOPN
40.0000 [IU] | PEN_INJECTOR | Freq: Every day | SUBCUTANEOUS | 0 refills | Status: DC
  Filled 2024-05-24: qty 12, 30d supply, fill #0

## 2024-05-29 ENCOUNTER — Other Ambulatory Visit: Payer: Self-pay

## 2024-06-01 ENCOUNTER — Ambulatory Visit: Admitting: Nurse Practitioner

## 2024-06-01 VITALS — BP 120/76 | HR 96 | Temp 97.8°F | Ht <= 58 in | Wt 202.0 lb

## 2024-06-01 DIAGNOSIS — Z794 Long term (current) use of insulin: Secondary | ICD-10-CM | POA: Diagnosis not present

## 2024-06-01 DIAGNOSIS — E119 Type 2 diabetes mellitus without complications: Secondary | ICD-10-CM | POA: Diagnosis not present

## 2024-06-01 NOTE — Progress Notes (Signed)
 Established Patient Office Visit  Subjective:  Patient ID: Tamara Wells, female    DOB: September 03, 1961  Age: 63 y.o. MRN: [id number]  CC:  Chief Complaint  Patient presents with   Medical Management of Chronic Issues   Discussed the use of a AI scribe software for clinical note transcription with the patient, who gave verbal consent to proceed.  HPI  Tamara Wells is a 63 year old female with diabetes who presents for a follow-up after medication adjustment.  She is feeling well since her diabetes medications were decreased. Blood sugar levels are within the desired range 94% of the time. She experiences difficulty with her glucose monitor, which frequently falls off, leading to additional costs. She has been manually checking her blood sugar levels by fingerstick in the interim.  She is currently taking 1000 mg of metformin  once at night, 40 units of Tresiba  in the morning, and Mounjaro  weekly on Mondays.  Past Medical History:  Diagnosis Date   Diabetes mellitus without complication (HCC)    Dx 2006   Hyperlipidemia    Hypertension    Sleep apnea    Vertigo    2002    Past Surgical History:  Procedure Laterality Date   ABDOMINAL HYSTERECTOMY     2000   CARPAL TUNNEL RELEASE Left    CARPAL TUNNEL RELEASE Right 05/28/2019   Procedure: CARPAL TUNNEL RELEASE;  Surgeon: Cleotilde Barrio, MD;  Location: [facility] ORS;  Service: Orthopedics;  Laterality: Right;   COLONOSCOPY N/A 01/11/2024   Procedure: COLONOSCOPY;  Surgeon: Unk Corinn Skiff, MD;  Location: [facility] ENDOSCOPY;  Service: Gastroenterology;  Laterality: N/A;  1st CASE; DO NOT MOVE   COLONOSCOPY WITH PROPOFOL  N/A 03/21/2020   Procedure: COLONOSCOPY WITH PROPOFOL ;  Surgeon: Janalyn Keene NOVAK, MD;  Location: [facility] ENDOSCOPY;  Service: Endoscopy;  Laterality: N/A;   TONSILLECTOMY      Family History  Problem Relation Age of Onset   Diabetes Mother    Diabetes Father    Diabetes Maternal Grandmother    Breast cancer  Neg Hx     Social History   Socioeconomic History   Marital status: Married    Spouse name: Not on file   Number of children: Not on file   Years of education: Not on file   Highest education level: Not on file  Occupational History   Not on file  Tobacco Use   Smoking status: Former    Current packs/day: 0.00    Types: Cigarettes    Quit date: 09/27/2004    Years since quitting: 19.7   Smokeless tobacco: Never  Vaping Use   Vaping status: Never Used  Substance and Sexual Activity   Alcohol use: No   Drug use: No   Sexual activity: Not Currently    Birth control/protection: Surgical  Other Topics Concern   Not on file  Social History Narrative   Not on file   Social Drivers of Health   Financial Resource Strain: Not on file  Food Insecurity: No Food Insecurity (04/26/2024)   Hunger Vital Sign    Worried About Running Out of Food in the Last Year: Never true    Ran Out of Food in the Last Year: Never true  Transportation Needs: No Transportation Needs (04/26/2024)   PRAPARE - Administrator, Civil Service (Medical): No    Lack of Transportation (Non-Medical): No  Physical Activity: Not on file  Stress: Not on file  Social Connections: Not on file  Intimate Partner Violence: Not At Risk (04/26/2024)   Humiliation, Afraid, Rape, and Kick questionnaire    Fear of Current or Ex-Partner: No    Emotionally Abused: No    Physically Abused: No    Sexually Abused: No     Outpatient Medications Prior to Visit  Medication Sig Dispense Refill   albuterol  (VENTOLIN  HFA) 108 (90 Base) MCG/ACT inhaler Inhale 2 puffs into the lungs every 4 hours as needed for wheezing. 6.7 g 0   aspirin EC 81 MG tablet Take 81 mg by mouth daily.     Black Cohosh 40 MG CAPS Take 40 mg by mouth 2 (two) times daily.     blood glucose meter kit and supplies Dispense based on patient and insurance preference. Use up to four times daily as directed. (FOR ICD-10 E10.9, E11.9). 1 each 0    Blood Pressure Monitoring (OMRON 3 SERIES BP MONITOR) DEVI use to check blood pressure 1 each 0   Continuous Glucose Sensor (DEXCOM G7 SENSOR) MISC Use as directed 3 each 10   Crisaborole  (EUCRISA ) 2 % OINT Apply 1 Application topically to affected skin on breast every morning. 60 g 6   ferrous sulfate  (FEROSUL) 325 (65 FE) MG tablet Take 1 tablet (325 mg total) by mouth daily. 30 tablet 0   fluticasone  (FLONASE ) 50 MCG/ACT nasal spray Place 2 sprays into both nostrils daily. 16 g 6   furosemide  (LASIX ) 20 MG tablet Take 1 tablet (20 mg total) by mouth every other day. 30 tablet 3   glucose blood test strip Test as directed up to 4 times a day 100 each 0   hydrOXYzine  (VISTARIL ) 25 MG capsule Take 1 capsule (25 mg total) by mouth every 8 (eight) hours as needed. 30 capsule 0   insulin  degludec (TRESIBA  FLEXTOUCH) 100 UNIT/ML FlexTouch Pen Inject 40 Units into the skin daily. 12 mL 0   ketoconazole  (NIZORAL ) 2 % cream Apply 1 Application topically 2 (two) times daily under the breast. 60 g 5   Lancets (FREESTYLE) lancets Test up to 4 times a day as directed 100 each 0   latanoprost  (XALATAN ) 0.005 % ophthalmic solution instill 1 drop into affected eye(s) once daily in the evening 5 mL 3   latanoprost  (XALATAN ) 0.005 % ophthalmic solution Place 1 drop into both eyes daily in the evening. 2.5 mL 0   latanoprost  (XALATAN ) 0.005 % ophthalmic solution Place 1 drop into affected eye(s) every evening. 5 mL 1   magic mouthwash (nystatin , lidocaine , diphenhydrAMINE , alum & mag hydroxide) suspension Swish and spit 5 mLs 3 (three) times daily as needed for mouth pain. 180 mL 0   meclizine (ANTIVERT) 25 MG tablet Take 25 mg by mouth 3 (three) times daily as needed for dizziness.      meloxicam  (MOBIC ) 15 MG tablet Take 1 tablet (15 mg total) by mouth daily with meal. 90 tablet 2   metFORMIN  (GLUCOPHAGE ) 1000 MG tablet Take 1 tablet (1,000 mg total) by mouth 2 (two) times daily. 180 tablet 2   mometasone   (ELOCON ) 0.1 % cream Apply 1 Application topically 2 (two) times daily under breast 3 times a week. 45 g 5   Multiple Vitamins-Minerals (CENTRAL-VITE PO) Take 1 tablet by mouth daily.     mupirocin  ointment (BACTROBAN ) 2 % Apply 1 Application topically 2 (two) times daily. 22 g 0   rosuvastatin  (CRESTOR ) 10 MG tablet Take 1 tablet (10 mg total) by mouth daily. 90 tablet 3   tirzepatide  (MOUNJARO ) 10  MG/0.5ML Pen Inject 10 mg into the skin once a week. 6 mL 2   tirzepatide  (MOUNJARO ) 12.5 MG/0.5ML Pen Inject 12.5 mg into the skin once a week. 6 mL 4   valsartan -hydrochlorothiazide  (DIOVAN -HCT) 80-12.5 MG tablet Take 1 tablet by mouth daily. 90 tablet 3   gabapentin  (NEURONTIN ) 100 MG capsule Take 1 capsule (100 mg total) by mouth 3 (three) times daily. 90 capsule 3   No facility-administered medications prior to visit.    Allergies  Allergen Reactions   No Known Allergies     ROS Review of Systems Negative unless indicated in HPI.    Objective:    Physical Exam  BP 120/76   Pulse 96   Temp 97.8 F (36.6 C)   Ht 4' 10 (1.473 m)   Wt 202 lb (91.6 kg)   SpO2 95%   BMI 42.22 kg/m  Wt Readings from Last 3 Encounters:  06/01/24 202 lb (91.6 kg)  05/04/24 198 lb 9.6 oz (90.1 kg)  04/26/24 198 lb 6.6 oz (90 kg)     Health Maintenance  Topic Date Due   Pneumococcal Vaccine: 50+ Years (1 of 2 - PCV) Never done   Zoster Vaccines- Shingrix (1 of 2) Never done   Cervical Cancer Screening (HPV/Pap Cotest)  Never done   OPHTHALMOLOGY EXAM  06/30/2022   COVID-19 Vaccine (7 - 2025-26 season) 05/28/2024   Influenza Vaccine  12/25/2024 (Originally 04/27/2024)   HEMOGLOBIN A1C  10/06/2024   FOOT EXAM  12/07/2024   Colonoscopy  01/10/2025   Diabetic kidney evaluation - Urine ACR  04/05/2025   Diabetic kidney evaluation - eGFR measurement  04/27/2025   Mammogram  01/26/2026   DTaP/Tdap/Td (4 - Td or Tdap) 07/05/2032   Hepatitis C Screening  Completed   HIV Screening  Completed    Hepatitis B Vaccines 19-59 Average Risk  Aged Out   HPV VACCINES  Aged Out   Meningococcal B Vaccine  Aged Out    There are no preventive care reminders to display for this patient.  Lab Results  Component Value Date   TSH 1.60 12/08/2023   Lab Results  Component Value Date   WBC 9.9 04/26/2024   HGB 11.8 (L) 04/26/2024   HCT 38.7 04/26/2024   MCV 93.0 04/26/2024   PLT 324 04/26/2024   Lab Results  Component Value Date   NA 137 04/27/2024   K 4.0 04/27/2024   CO2 26 04/27/2024   GLUCOSE 162 (H) 04/27/2024   BUN 18 04/27/2024   CREATININE 0.91 04/27/2024   BILITOT <0.2 04/26/2024   ALKPHOS 55 04/26/2024   AST 29 04/26/2024   ALT 21 04/26/2024   PROT 7.3 04/26/2024   ALBUMIN 4.0 04/26/2024   CALCIUM  8.8 (L) 04/27/2024   ANIONGAP 8 04/27/2024   EGFR 86 01/07/2023   GFR 53.01 (L) 04/05/2024   Lab Results  Component Value Date   CHOL 112 04/05/2024   Lab Results  Component Value Date   HDL 37.60 (L) 04/05/2024   Lab Results  Component Value Date   LDLCALC 47 04/05/2024   Lab Results  Component Value Date   TRIG 134.0 04/05/2024   Lab Results  Component Value Date   CHOLHDL 3 04/05/2024   Lab Results  Component Value Date   HGBA1C 6.9 (H) 04/05/2024      Assessment & Plan:  Type 2 diabetes mellitus without complication, with long-term current use of insulin  (HCC) Assessment & Plan: Diabetes well-controlled with current regimen. CGM issues affect  monitoring. - Continue metformin  1000 mg at night. - Continue Tresiba  40 units in the morning. - Continue Mounjaro  weekly on Mondays. - Educated on proper CGM application. - Ensure access to quick glucose sources for hypoglycemia. - Reinforced importance of regular meals with insulin .     Follow-up: Return in about 4 months (around 10/01/2024) for follow up with fasting lab 2 days prior.   Larue Drawdy, NP

## 2024-06-08 ENCOUNTER — Other Ambulatory Visit: Payer: Self-pay

## 2024-06-10 ENCOUNTER — Other Ambulatory Visit: Payer: Self-pay | Admitting: Nurse Practitioner

## 2024-06-10 DIAGNOSIS — E119 Type 2 diabetes mellitus without complications: Secondary | ICD-10-CM

## 2024-06-10 MED FILL — Furosemide Tab 20 MG: ORAL | 60 days supply | Qty: 30 | Fill #3 | Status: AC

## 2024-06-11 ENCOUNTER — Other Ambulatory Visit: Payer: Self-pay

## 2024-06-11 MED ORDER — GABAPENTIN 100 MG PO CAPS
100.0000 mg | ORAL_CAPSULE | Freq: Three times a day (TID) | ORAL | 3 refills | Status: AC
  Filled 2024-06-11: qty 90, 30d supply, fill #0
  Filled 2024-07-29: qty 90, 30d supply, fill #1
  Filled 2024-09-23: qty 90, 30d supply, fill #2
  Filled 2024-10-11 – 2024-10-31 (×2): qty 90, 30d supply, fill #3

## 2024-06-18 DIAGNOSIS — G4733 Obstructive sleep apnea (adult) (pediatric): Secondary | ICD-10-CM | POA: Diagnosis not present

## 2024-06-25 ENCOUNTER — Other Ambulatory Visit: Payer: Self-pay

## 2024-06-25 ENCOUNTER — Other Ambulatory Visit: Payer: Self-pay | Admitting: Nurse Practitioner

## 2024-06-25 MED FILL — Valsartan-Hydrochlorothiazide Tab 80-12.5 MG: ORAL | 90 days supply | Qty: 90 | Fill #0 | Status: AC

## 2024-06-25 MED FILL — Insulin Degludec Soln Pen-Injector 100 Unit/ML: SUBCUTANEOUS | 30 days supply | Qty: 12 | Fill #0 | Status: AC

## 2024-06-25 NOTE — Assessment & Plan Note (Signed)
 Diabetes well-controlled with current regimen. CGM issues affect monitoring. - Continue metformin  1000 mg at night. - Continue Tresiba  40 units in the morning. - Continue Mounjaro  weekly on Mondays. - Educated on proper CGM application. - Ensure access to quick glucose sources for hypoglycemia. - Reinforced importance of regular meals with insulin .

## 2024-07-02 ENCOUNTER — Other Ambulatory Visit: Payer: Self-pay

## 2024-07-16 ENCOUNTER — Other Ambulatory Visit: Payer: Self-pay

## 2024-07-22 DIAGNOSIS — G4733 Obstructive sleep apnea (adult) (pediatric): Secondary | ICD-10-CM | POA: Diagnosis not present

## 2024-07-26 ENCOUNTER — Other Ambulatory Visit: Payer: Self-pay | Admitting: Nurse Practitioner

## 2024-07-26 ENCOUNTER — Other Ambulatory Visit: Payer: Self-pay

## 2024-07-26 MED ORDER — TRESIBA FLEXTOUCH 100 UNIT/ML ~~LOC~~ SOPN
40.0000 [IU] | PEN_INJECTOR | Freq: Every day | SUBCUTANEOUS | 0 refills | Status: DC
  Filled 2024-07-26: qty 12, 30d supply, fill #0

## 2024-07-29 ENCOUNTER — Other Ambulatory Visit: Payer: Self-pay | Admitting: Nurse Practitioner

## 2024-07-29 ENCOUNTER — Other Ambulatory Visit: Payer: Self-pay

## 2024-07-30 ENCOUNTER — Other Ambulatory Visit: Payer: Self-pay

## 2024-07-30 ENCOUNTER — Other Ambulatory Visit: Payer: Self-pay | Admitting: Nurse Practitioner

## 2024-07-30 MED FILL — Furosemide Tab 20 MG: ORAL | 60 days supply | Qty: 30 | Fill #0 | Status: AC

## 2024-07-31 ENCOUNTER — Other Ambulatory Visit: Payer: Self-pay

## 2024-08-20 ENCOUNTER — Other Ambulatory Visit: Payer: Self-pay

## 2024-08-21 ENCOUNTER — Other Ambulatory Visit: Payer: Self-pay

## 2024-08-24 ENCOUNTER — Other Ambulatory Visit: Payer: Self-pay

## 2024-09-03 ENCOUNTER — Other Ambulatory Visit: Payer: Self-pay | Admitting: Nurse Practitioner

## 2024-09-03 ENCOUNTER — Other Ambulatory Visit: Payer: Self-pay

## 2024-09-03 MED ORDER — TRESIBA FLEXTOUCH 100 UNIT/ML ~~LOC~~ SOPN
40.0000 [IU] | PEN_INJECTOR | Freq: Every day | SUBCUTANEOUS | 0 refills | Status: DC
  Filled 2024-09-03: qty 12, 30d supply, fill #0

## 2024-09-26 MED FILL — Valsartan-Hydrochlorothiazide Tab 80-12.5 MG: ORAL | 90 days supply | Qty: 90 | Fill #1 | Status: AC

## 2024-09-26 MED FILL — Furosemide Tab 20 MG: ORAL | 60 days supply | Qty: 30 | Fill #1 | Status: AC

## 2024-09-28 ENCOUNTER — Other Ambulatory Visit: Payer: Self-pay

## 2024-09-29 ENCOUNTER — Other Ambulatory Visit: Payer: Self-pay

## 2024-10-01 DIAGNOSIS — E785 Hyperlipidemia, unspecified: Secondary | ICD-10-CM

## 2024-10-02 NOTE — Addendum Note (Signed)
 Addended by: BRIEN SHARENE RAMAN on: 10/02/2024 07:29 PM   Modules accepted: Orders

## 2024-10-03 ENCOUNTER — Other Ambulatory Visit

## 2024-10-03 DIAGNOSIS — E785 Hyperlipidemia, unspecified: Secondary | ICD-10-CM | POA: Diagnosis not present

## 2024-10-03 LAB — COMPREHENSIVE METABOLIC PANEL WITH GFR
ALT: 20 U/L (ref 3–35)
AST: 18 U/L (ref 5–37)
Albumin: 4.1 g/dL (ref 3.5–5.2)
Alkaline Phosphatase: 87 U/L (ref 39–117)
BUN: 19 mg/dL (ref 6–23)
CO2: 27 meq/L (ref 19–32)
Calcium: 9.1 mg/dL (ref 8.4–10.5)
Chloride: 106 meq/L (ref 96–112)
Creatinine, Ser: 0.9 mg/dL (ref 0.40–1.20)
GFR: 68.68 mL/min
Glucose, Bld: 121 mg/dL — ABNORMAL HIGH (ref 70–99)
Potassium: 4.2 meq/L (ref 3.5–5.1)
Sodium: 141 meq/L (ref 135–145)
Total Bilirubin: 0.2 mg/dL (ref 0.2–1.2)
Total Protein: 6.4 g/dL (ref 6.0–8.3)

## 2024-10-03 LAB — LIPID PANEL
Cholesterol: 119 mg/dL (ref 28–200)
HDL: 37 mg/dL — ABNORMAL LOW
LDL Cholesterol: 31 mg/dL (ref 10–99)
NonHDL: 82.41
Total CHOL/HDL Ratio: 3
Triglycerides: 255 mg/dL — ABNORMAL HIGH (ref 10.0–149.0)
VLDL: 51 mg/dL — ABNORMAL HIGH (ref 0.0–40.0)

## 2024-10-05 ENCOUNTER — Ambulatory Visit: Admitting: Nurse Practitioner

## 2024-10-11 ENCOUNTER — Other Ambulatory Visit: Payer: Self-pay | Admitting: Nurse Practitioner

## 2024-10-11 ENCOUNTER — Other Ambulatory Visit: Payer: Self-pay

## 2024-10-11 ENCOUNTER — Ambulatory Visit: Payer: Self-pay | Admitting: Nurse Practitioner

## 2024-10-11 ENCOUNTER — Other Ambulatory Visit: Payer: Self-pay | Admitting: Family

## 2024-10-11 MED ORDER — TRESIBA FLEXTOUCH 100 UNIT/ML ~~LOC~~ SOPN
40.0000 [IU] | PEN_INJECTOR | Freq: Every day | SUBCUTANEOUS | 0 refills | Status: AC
  Filled 2024-10-11: qty 12, 30d supply, fill #0

## 2024-10-11 MED ORDER — METFORMIN HCL 1000 MG PO TABS
1000.0000 mg | ORAL_TABLET | Freq: Two times a day (BID) | ORAL | 2 refills | Status: AC
  Filled 2024-10-11: qty 180, 90d supply, fill #0

## 2024-10-12 ENCOUNTER — Other Ambulatory Visit: Payer: Self-pay

## 2024-10-13 ENCOUNTER — Other Ambulatory Visit: Payer: Self-pay

## 2024-10-14 ENCOUNTER — Other Ambulatory Visit: Payer: Self-pay

## 2024-10-15 ENCOUNTER — Other Ambulatory Visit: Payer: Self-pay

## 2024-10-15 MED ORDER — LATANOPROST 0.005 % OP SOLN
1.0000 [drp] | Freq: Every evening | OPHTHALMIC | 1 refills | Status: AC
  Filled 2024-10-15: qty 5, 90d supply, fill #0

## 2024-10-18 ENCOUNTER — Other Ambulatory Visit: Payer: Self-pay

## 2024-10-19 ENCOUNTER — Other Ambulatory Visit: Payer: Self-pay

## 2024-10-22 ENCOUNTER — Other Ambulatory Visit: Payer: Self-pay

## 2024-10-23 ENCOUNTER — Other Ambulatory Visit: Payer: Self-pay

## 2024-10-25 ENCOUNTER — Other Ambulatory Visit: Payer: Self-pay | Admitting: Nurse Practitioner

## 2024-10-25 DIAGNOSIS — E119 Type 2 diabetes mellitus without complications: Secondary | ICD-10-CM

## 2024-10-26 ENCOUNTER — Other Ambulatory Visit: Payer: Self-pay

## 2024-10-26 ENCOUNTER — Ambulatory Visit: Admitting: Nurse Practitioner

## 2024-10-26 VITALS — BP 114/76 | HR 103 | Temp 97.4°F | Ht <= 58 in | Wt 204.4 lb

## 2024-10-26 DIAGNOSIS — Z794 Long term (current) use of insulin: Secondary | ICD-10-CM

## 2024-10-26 DIAGNOSIS — Z6841 Body Mass Index (BMI) 40.0 and over, adult: Secondary | ICD-10-CM | POA: Diagnosis not present

## 2024-10-26 DIAGNOSIS — E785 Hyperlipidemia, unspecified: Secondary | ICD-10-CM

## 2024-10-26 DIAGNOSIS — E119 Type 2 diabetes mellitus without complications: Secondary | ICD-10-CM | POA: Diagnosis not present

## 2024-10-26 DIAGNOSIS — I10 Essential (primary) hypertension: Secondary | ICD-10-CM | POA: Diagnosis not present

## 2024-10-26 LAB — POCT GLYCOSYLATED HEMOGLOBIN (HGB A1C): Hemoglobin A1C: 7.1 % — AB (ref 4.0–5.6)

## 2024-10-26 MED ORDER — MOUNJARO 12.5 MG/0.5ML ~~LOC~~ SOAJ
12.5000 mg | SUBCUTANEOUS | 4 refills | Status: DC
  Filled 2024-10-26: qty 6, 84d supply, fill #0

## 2024-10-26 MED ORDER — TIRZEPATIDE 15 MG/0.5ML ~~LOC~~ SOAJ
15.0000 mg | SUBCUTANEOUS | 2 refills | Status: AC
  Filled 2024-10-26: qty 6, 84d supply, fill #0
  Filled 2024-10-26 – 2024-10-31 (×3): qty 2, 28d supply, fill #0

## 2024-10-26 NOTE — Assessment & Plan Note (Signed)
" °  Orders:   Microalbumin / creatinine urine ratio   tirzepatide  (MOUNJARO ) 15 MG/0.5ML Pen; Inject 15 mg into the skin once a week.   POCT HgB A1C  "

## 2024-10-26 NOTE — Assessment & Plan Note (Signed)
 Triglycerides elevated at 255 mg/dL. On cholesterol medication. Dietary modifications discussed. - Continue Crestor  10 mg daily. - Advise dietary modifications to reduce fried foods and red meat. - Encourage regular exercise.

## 2024-10-26 NOTE — Assessment & Plan Note (Signed)
 Tamara Wells

## 2024-10-26 NOTE — Assessment & Plan Note (Addendum)
 Lab Results  Component Value Date   HGBA1C 7.1 (A) 10/26/2024  -Continue Metformin  1000 mg, Tresiba  20 units. -Increase Mounjaro  from 12.5-15 mg weekly

## 2024-10-27 LAB — MICROALBUMIN / CREATININE URINE RATIO
Creatinine, Urine: 110 mg/dL (ref 20–275)
Microalb Creat Ratio: 4 mg/g{creat}
Microalb, Ur: 0.4 mg/dL

## 2024-10-27 NOTE — Assessment & Plan Note (Addendum)
 Body mass index is 42.72 kg/m. - Increase Mounjaro  12.5 mg to 15 mg weekly. -Advise diet management and exercise.

## 2024-10-30 ENCOUNTER — Other Ambulatory Visit: Payer: Self-pay

## 2024-10-31 ENCOUNTER — Other Ambulatory Visit: Payer: Self-pay

## 2024-11-01 ENCOUNTER — Other Ambulatory Visit: Payer: Self-pay

## 2024-11-01 ENCOUNTER — Ambulatory Visit: Payer: Self-pay | Admitting: Nurse Practitioner

## 2024-11-01 NOTE — Progress Notes (Signed)
 Please inform patient: A1c 7.1.  Continue medication, diet management and exercise. No protein leakage from the kidneys.

## 2024-11-21 ENCOUNTER — Encounter: Admitting: Primary Care

## 2024-12-05 ENCOUNTER — Ambulatory Visit: Admitting: Dermatology

## 2025-01-25 ENCOUNTER — Ambulatory Visit: Admitting: Nurse Practitioner
